# Patient Record
Sex: Male | Born: 1975
Health system: Southern US, Community
[De-identification: ages and names within clinical notes are randomized; demographics above are authoritative.]

## PROBLEM LIST (undated history)

## (undated) DIAGNOSIS — J302 Other seasonal allergic rhinitis: Secondary | ICD-10-CM

## (undated) DIAGNOSIS — I82409 Acute embolism and thrombosis of unspecified deep veins of unspecified lower extremity: Secondary | ICD-10-CM

---

## 2010-09-18 ENCOUNTER — Inpatient Hospital Stay (HOSPITAL_COMMUNITY)
Admission: EM | Admit: 2010-09-18 | Discharge: 2010-09-25 | DRG: 176 | Disposition: A | Discharge status: Home / Self Care | Payer: Self-pay | Attending: Interventional Cardiology | Admitting: Interventional Cardiology

## 2010-09-18 DIAGNOSIS — I309 Acute pericarditis, unspecified: Secondary | ICD-10-CM | POA: Diagnosis present

## 2010-09-18 DIAGNOSIS — I2699 Other pulmonary embolism without acute cor pulmonale: Principal | ICD-10-CM | POA: Diagnosis present

## 2010-09-18 DIAGNOSIS — F17200 Nicotine dependence, unspecified, uncomplicated: Secondary | ICD-10-CM | POA: Diagnosis present

## 2010-09-18 DIAGNOSIS — I82409 Acute embolism and thrombosis of unspecified deep veins of unspecified lower extremity: Secondary | ICD-10-CM | POA: Diagnosis present

## 2010-09-18 LAB — COMPREHENSIVE METABOLIC PANEL
AST: 34 U/L (ref 0–37)
CO2: 23 mEq/L (ref 19–32)
Calcium: 9.1 mg/dL (ref 8.4–10.5)
Creatinine, Ser: 1.45 mg/dL (ref 0.4–1.5)
GFR calc Af Amer: >60 mL/min (ref >60)
GFR calc non Af Amer: 56 mL/min — ABNORMAL LOW (ref >60)
Glucose, Bld: 99 mg/dL (ref 70–99)

## 2010-09-18 LAB — TROPONIN I: Troponin I: 0.08 ng/mL — ABNORMAL HIGH (ref 0.00–0.06)

## 2010-09-18 LAB — POCT CARDIAC MARKERS
CKMB, poc: 1.6 ng/mL (ref 1.0–8.0)
Myoglobin, poc: 104 ng/mL (ref 12–200)

## 2010-09-18 LAB — CK TOTAL AND CKMB (NOT AT ARMC)
Relative Index: 1.9 (ref 0.0–2.5)
Total CK: 181 U/L (ref 7–232)

## 2010-09-18 LAB — DIFFERENTIAL
Basophils Absolute: 0.1 10*3/uL (ref 0.0–0.1)
Basophils Relative: 1 % (ref 0–1)
Monocytes Absolute: 0.9 10*3/uL (ref 0.1–1.0)
Neutro Abs: 9.8 10*3/uL — ABNORMAL HIGH (ref 1.7–7.7)
Neutrophils Relative %: 71 % (ref 43–77)

## 2010-09-18 LAB — CBC
Hemoglobin: 15.2 g/dL (ref 13.0–17.0)
MCHC: 33.9 g/dL (ref 30.0–36.0)
WBC: 13.7 10*3/uL — ABNORMAL HIGH (ref 4.0–10.5)

## 2010-09-19 LAB — BRAIN NATRIURETIC PEPTIDE: Pro B Natriuretic peptide (BNP): 83 pg/mL (ref 0.0–100.0)

## 2010-09-19 LAB — CARDIAC PANEL(CRET KIN+CKTOT+MB+TROPI)
CK, MB: 3.6 ng/mL (ref 0.3–4.0)
Relative Index: 2.5 (ref 0.0–2.5)
Relative Index: 3 — ABNORMAL HIGH (ref 0.0–2.5)
Total CK: 108 U/L (ref 7–232)
Troponin I: 0.02 ng/mL (ref 0.00–0.06)
Troponin I: 0.01 ng/mL (ref 0.00–0.06)

## 2010-09-19 LAB — D-DIMER, QUANTITATIVE: D-Dimer, Quant: >20 ug/mL-FEU — ABNORMAL HIGH (ref 0.00–0.48)

## 2010-09-19 LAB — HOMOCYSTEINE: Homocysteine: 7 umol/L (ref 4.0–15.4)

## 2010-09-20 LAB — CARDIAC PANEL(CRET KIN+CKTOT+MB+TROPI)
CK, MB: 2.8 ng/mL (ref 0.3–4.0)
CK, MB: 2.6 ng/mL (ref 0.3–4.0)
Relative Index: 2.1 (ref 0.0–2.5)
Total CK: 133 U/L (ref 7–232)

## 2010-09-20 LAB — CBC
Hemoglobin: 14.9 g/dL (ref 13.0–17.0)
MCH: 30.3 pg (ref 26.0–34.0)
MCV: 88.6 fL (ref 78.0–100.0)
RBC: 4.91 MIL/uL (ref 4.22–5.81)

## 2010-09-20 LAB — URINALYSIS, ROUTINE W REFLEX MICROSCOPIC
Bilirubin Urine: NEGATIVE
Nitrite: NEGATIVE
Protein, ur: NEGATIVE mg/dL
Specific Gravity, Urine: 1.026 (ref 1.005–1.030)
Urobilinogen, UA: 1 mg/dL (ref 0.0–1.0)

## 2010-09-20 LAB — HEPARIN LEVEL (UNFRACTIONATED): Heparin Unfractionated: 0.42 IU/mL (ref 0.30–0.70)

## 2010-09-21 LAB — CBC
HCT: 44.1 % (ref 39.0–52.0)
Hemoglobin: 14.8 g/dL (ref 13.0–17.0)
MCH: 30.1 pg (ref 26.0–34.0)
MCHC: 33.6 g/dL (ref 30.0–36.0)
MCV: 89.8 fL (ref 78.0–100.0)
RBC: 4.91 MIL/uL (ref 4.22–5.81)

## 2010-09-21 LAB — PROTIME-INR: Prothrombin Time: 14 seconds (ref 11.6–15.2)

## 2010-09-21 LAB — CARDIAC PANEL(CRET KIN+CKTOT+MB+TROPI)
CK, MB: 2.3 ng/mL (ref 0.3–4.0)
CK, MB: 2.3 ng/mL (ref 0.3–4.0)
Relative Index: 1.9 (ref 0.0–2.5)
Relative Index: 2.2 (ref 0.0–2.5)
Total CK: 104 U/L (ref 7–232)
Total CK: 108 U/L (ref 7–232)
Troponin I: 0.01 ng/mL (ref 0.00–0.06)

## 2010-09-21 LAB — ANA: Anti Nuclear Antibody(ANA): NEGATIVE

## 2010-09-21 LAB — CARDIOLIPIN ANTIBODIES, IGG, IGM, IGA: Anticardiolipin IgM: 3 MPL U/mL — ABNORMAL LOW (ref <11)

## 2010-09-21 LAB — PROTEIN S ACTIVITY: Protein S Activity: 146 % — ABNORMAL HIGH (ref 69–129)

## 2010-09-21 LAB — PROTEIN C ACTIVITY: Protein C Activity: 133 % (ref 75–133)

## 2010-09-22 LAB — CBC
MCH: 29.9 pg (ref 26.0–34.0)
MCHC: 33.7 g/dL (ref 30.0–36.0)
MCV: 88.7 fL (ref 78.0–100.0)
Platelets: 206 10*3/uL (ref 150–400)
RBC: 4.95 MIL/uL (ref 4.22–5.81)

## 2010-09-22 LAB — LUPUS ANTICOAGULANT PANEL
DRVVT: 78.9 secs — ABNORMAL HIGH (ref 36.2–44.3)
PTT Lupus Anticoagulant: 200 secs — ABNORMAL HIGH (ref 30.0–45.6)
PTTLA Confirmation: 3.1 secs (ref <8.0)
dRVVT Incubated 1:1 Mix: 41.4 secs (ref 36.2–44.3)

## 2010-09-22 LAB — HEPARIN LEVEL (UNFRACTIONATED): Heparin Unfractionated: 0.42 IU/mL (ref 0.30–0.70)

## 2010-09-23 DIAGNOSIS — I82409 Acute embolism and thrombosis of unspecified deep veins of unspecified lower extremity: Secondary | ICD-10-CM

## 2010-09-23 DIAGNOSIS — I2699 Other pulmonary embolism without acute cor pulmonale: Secondary | ICD-10-CM

## 2010-09-23 LAB — BASIC METABOLIC PANEL
BUN: 13 mg/dL (ref 6–23)
CO2: 28 mEq/L (ref 19–32)
Calcium: 9.3 mg/dL (ref 8.4–10.5)
Chloride: 103 mEq/L (ref 96–112)
Creatinine, Ser: 1.39 mg/dL (ref 0.4–1.5)
Glucose, Bld: 97 mg/dL (ref 70–99)

## 2010-09-23 LAB — CBC
HCT: 44.1 % (ref 39.0–52.0)
MCHC: 32.4 g/dL (ref 30.0–36.0)
MCV: 89.3 fL (ref 78.0–100.0)
Platelets: 214 10*3/uL (ref 150–400)
RDW: 13.5 % (ref 11.5–15.5)
WBC: 9 10*3/uL (ref 4.0–10.5)

## 2010-09-24 LAB — CBC
HCT: 44.6 % (ref 39.0–52.0)
Hemoglobin: 15.4 g/dL (ref 13.0–17.0)
MCH: 30.1 pg (ref 26.0–34.0)
MCHC: 34.5 g/dL (ref 30.0–36.0)
MCV: 87.1 fL (ref 78.0–100.0)
RBC: 5.12 MIL/uL (ref 4.22–5.81)

## 2010-09-24 LAB — BASIC METABOLIC PANEL
BUN: 14 mg/dL (ref 6–23)
Calcium: 9 mg/dL (ref 8.4–10.5)
GFR calc non Af Amer: >60 mL/min (ref >60)
Glucose, Bld: 96 mg/dL (ref 70–99)
Potassium: 4.1 mEq/L (ref 3.5–5.1)
Sodium: 138 mEq/L (ref 135–145)

## 2010-09-24 LAB — PROTEIN C, TOTAL: Protein C, Total: 106 % (ref 70–140)

## 2010-09-24 LAB — PROTIME-INR
INR: 2.1 — ABNORMAL HIGH (ref 0.00–1.49)
Prothrombin Time: 23.7 seconds — ABNORMAL HIGH (ref 11.6–15.2)

## 2010-09-25 LAB — PROTIME-INR
INR: 2.06 — ABNORMAL HIGH (ref 0.00–1.49)
Prothrombin Time: 23.4 seconds — ABNORMAL HIGH (ref 11.6–15.2)

## 2010-09-25 LAB — CBC
MCH: 30.2 pg (ref 26.0–34.0)
MCHC: 34.5 g/dL (ref 30.0–36.0)
Platelets: 246 10*3/uL (ref 150–400)

## 2010-10-01 NOTE — H&P (Signed)
  NAMEBESNIK, FEBUS NO.:  [id number]  MEDICAL RECORD NO.:  [id number]          PATIENT TYPE:  INP  LOCATION:  2902                         FACILITY:  [facility]  PHYSICIAN:  Lyn Records, M.D.   DATE OF BIRTH:  July 13, 1976  DATE OF ADMISSION:  09/18/2010 DATE OF DISCHARGE:                             HISTORY & PHYSICAL   REFERRING PHYSICIAN:  Emergency room.  REASON FOR ADMISSION:  Chest pain and abnormal EKG.  HISTORY OF PRESENT ILLNESS:  Richard [name] is 35 and has a 1-week history of intermittent precordial pressure.  At times, the discomfort causes him to breathe shallow because the pain is intensified by deep breathing. He denies dyspnea.  There have been no palpitations or syncope.  No recent URI.  No orthopnea or lower extremity swelling.  He has not had syncope or palpitations.  PAST MEDICAL HISTORY:  Unremarkable.  FAMILY HISTORY:  Positive for CAD.  SOCIAL HISTORY:  Does not drink.  Does not use illicit drugs.  Smokes an occasional cigarette.  DRUG ALLERGIES:  None.  MEDICATIONS:  None.  REVIEW OF SYSTEMS:  Unremarkable.  PHYSICAL EXAMINATION:  VITAL SIGNS:  BP 138/70 and heart rate 70. SKIN:  Warm and dry. HEENT:  Unremarkable. NECK:  No significant JVD.  No carotid bruits. LUNGS:  Clear.  No rales. CARDIAC:  No rub, click, murmur, or gallop. ABDOMEN:  Soft. EXTREMITIES:  No edema.  Pulses are 2+ and symmetric. NEUROLOGIC:  Normal.  IMAGING DATA:  Chest x-ray no acute abnormality.  LABORATORY DATA:  WBC 13,700, hemoglobin 15.2, and platelet count 175,000.  PT/INR is normal.  Potassium 4.3 and troponin I 0.08.  CK-MB normal 181/3.4.  Point of care markers, normal.  Creatinine is 1.45, BUN is 15.  EKG demonstrates diffuse ST elevation with the exception of precordial leads V1-V2.  ASSESSMENT:  Chest pain syndrome with diffuse ST-segment elevation on EKG compatible with acute pericarditis.  Rule out mild pericarditis.  PLAN: 1. Emergency  echo. 2. Decadron 4 mg IV. 3. Admit to the CCU. 4. Monitor closely. 5. Non-steroidal anti-inflammatory therapy.     Lyn Records, M.D.     HWS/MEDQ  D:  09/18/2010  T:  09/19/2010  Job:  [id number]  Electronically Signed by Verdis Prime M.D. on 10/01/2010 05:01:14 PM

## 2010-10-01 NOTE — Discharge Summary (Signed)
  NAMELAM, MCCUBBINS NO.:  [id number]  MEDICAL RECORD NO.:  [id number]           PATIENT TYPE:  I  LOCATION:  3710                         FACILITY:  [facility]  PHYSICIAN:  Lyn Records, M.D.   DATE OF BIRTH:  18-Feb-1976  DATE OF ADMISSION:  09/18/2010 DATE OF DISCHARGE:  09/25/2010                              DISCHARGE SUMMARY   ADDENDUM.  DISCHARGE MEDICATION INSTRUCTION:  Coumadin/warfarin 5 mg tablets 1-1/2 tablets each p.m.     Lyn Records, M.D.     HWS/MEDQ  D:  09/25/2010  T:  09/26/2010  Job:  [id number]  Electronically Signed by Verdis Prime M.D. on 10/01/2010 05:01:25 PM

## 2010-10-01 NOTE — Discharge Summary (Signed)
NAMEROBYN, NOHR NO.:  [id number]  MEDICAL RECORD NO.:  [id number]           PATIENT TYPE:  I  LOCATION:  3710                         FACILITY:  [facility]  PHYSICIAN:  Lyn Records, M.D.   DATE OF BIRTH:  1976-04-17  DATE OF ADMISSION:  09/18/2010 DATE OF DISCHARGE:                              DISCHARGE SUMMARY   REASON FOR ADMISSION:  Chest discomfort/pressure.  DISCHARGE DIAGNOSES: 1. Large bilateral pulmonary emboli.     a.     Left lower extremity deep venous thrombosis. 2. Normal variant electrocardiogram with marked early repolarization     and precordial and inferolateral leads, mimicking acute     pericarditis.  PROCEDURES PERFORMED: 1. A 2-D Doppler echocardiogram on September 18, 2010. 2. Pulmonary CT angio performed on September 19, 2010. 3. Lower extremity venous Doppler study performed on September 19, 2010.  DISCHARGE INSTRUCTIONS: 1. [facility]. 2. Medications:  Coumadin taken as instructed. 3. Physical activity:  As tolerated.  FOLLOWUP APPOINTMENTS: 1. Dr. [name], [facility] on Friday, October 09, 2010 at     3:30 p.m. 2. Dr. Verdis Prime on September 28, 2010 at 3 p.m. 3. [facility] on September 28, 2010 at 3:15 p.m.  The     patient was cautioned to call if blood loss is noted from gums,     urinary tract, respiratory tract or GI.  HISTORY AND PHYSICAL AND HOSPITAL COURSE:  The patient presented to the [facility] Emergency Room on September 18, 2010, with a 7-day history of vague exertional chest discomfort.  He did not expound on any particular dyspnea.  In the emergency room, the EKG identified essentially diffuse ST elevation and T-wave inversion in V1 and V2.  He was initially considered to be a STEMI, but we quickly decided that this was not an accurate diagnosis.  We initially entertained the possibility of myopericarditis.  He was admitted to the intensive care unit.  A dose of IV Decadron, 4 mg  was given.  The Decadron was used for the possibility of pericardial inflammation as both symptomatic treatment and diagnostic procedure.  The diagnosis was still in question.  An emergency echo was done and demonstrated low normal LV function, no pericardial effusion, and mild RV enlargement.  Late on the evening of admission, a D-dimer was ordered because of concern about the possible RV enlargement.  This study came back greater than 20, which is markedly abnormal.  This was recognized the following morning on rounds and upon taking oxygen off of the patient, it was noted that he was hypoxic.  CT angio was performed and demonstrated significant bilateral pulmonary emboli.  IV heparin was started and after 24 hours, Coumadin therapy was started.  The patient gradually improved over the hospital stay.  Oxygen requirements decreased.  At the time of discharge, he is able to ambulate without desaturating.  Coumadin dose will be monitored in the [facility].  Hypercoagulable studies were done and were normal.  The anticipated course of therapy will be 12 months given the bilateral nature of the emboli and the large clot  burden noted on CT.  Social issues will make it difficult to manage the patient.  He has no health insurance, lives in [place], and does not work.  I may consider referral to Hematology to make sure that we are not missing a factor V Leiden deficiency or some other obscure hypercoagulable syndrome.     Lyn Records, M.D.     HWS/MEDQ  D:  09/24/2010  T:  09/25/2010  Job:  [id number]  cc:   Oretha Milch, MD  Electronically Signed by Verdis Prime M.D. on 10/01/2010 05:01:19 PM

## 2010-10-09 ENCOUNTER — Encounter: Payer: Self-pay | Admitting: Pulmonary Disease

## 2010-10-09 ENCOUNTER — Inpatient Hospital Stay (INDEPENDENT_AMBULATORY_CARE_PROVIDER_SITE_OTHER): Payer: Self-pay | Admitting: Pulmonary Disease

## 2010-10-09 DIAGNOSIS — I80299 Phlebitis and thrombophlebitis of other deep vessels of unspecified lower extremity: Secondary | ICD-10-CM

## 2010-10-09 DIAGNOSIS — I2699 Other pulmonary embolism without acute cor pulmonale: Secondary | ICD-10-CM

## 2010-10-09 HISTORY — DX: Phlebitis and thrombophlebitis of other deep vessels of unspecified lower extremity: I80.299

## 2010-10-14 NOTE — Assessment & Plan Note (Signed)
Summary: hospital follow up per brandi//sh   Visit Type:  Hospital Follow-up   History of Present Illness: 35/M with large bilateral pulmonary emboli & LLE DVT picked up by Dr Katrinka Blazing during hospitalisatioin for chest pain. Echo s/o RV dusfunction, has been on coumadin x 2 weeks , being regualted by the [facility] clinic. Denies dyspnea, chest pain or leg swelling. INR 2-3 range per report Hypercoagualble panel neg  Preventive Screening-Counseling & Management  Alcohol-Tobacco     Alcohol drinks/day: 1     Alcohol type: beer     Smoking Status: quit     Packs/Day: <0.25     Year Started: 1996     Year Quit: 08/2010  Current Medications (verified): 1)  Coumadin 5 Mg Tabs (Warfarin Sodium) .... As Directed Per Coumadin Clinic  Allergies (verified): No Known Drug Allergies  Past History:  Past Medical History: 1. Large bilateral pulmonary emboli.     a.     Left lower extremity deep venous thrombosis.  Past Surgical History: none  Family History: Family History Hypertension-mother Family History Diabetes-maternal aunt Heart disease-maternal aunts and uncles  Social History: Marital Status: Single Children: no Occupation: unemployeed Patient states former smoker.  Smoking Status:  quit Packs/Day:  <0.25 Alcohol drinks/day:  1  Review of Systems  The patient denies shortness of breath with activity, shortness of breath at rest, productive cough, non-productive cough, coughing up blood, chest pain, irregular heartbeats, acid heartburn, indigestion, loss of appetite, weight change, abdominal pain, difficulty swallowing, sore throat, tooth/dental problems, headaches, nasal congestion/difficulty breathing through nose, sneezing, itching, ear ache, anxiety, depression, hand/feet swelling, joint stiffness or pain, rash, change in color of mucus, and fever.    Vital Signs:  Patient profile:   34 year old male Height:      72 inches Weight:      215.8 pounds BMI:     29.37 O2  Sat:      94 % on Room air Temp:     98.9 degrees F oral Pulse rate:   86 / minute BP sitting:   114 / 78  (left arm) Cuff size:   large  Vitals Entered By: Zackery Barefoot CMA (October 09, 2010 3:51 PM)  O2 Flow:  Room air  Physical Exam  Additional Exam:  Gen. Pleasant, well-nourished, in no distress ENT - no lesions, no post nasal drip Neck: No JVD, no thyromegaly, no carotid bruits Lungs: no use of accessory muscles, no dullness to percussion, clear without rales or rhonchi  Cardiovascular: Rhythm regular, heart sounds  normal, no murmurs or gallops, no peripheral edema Musculoskeletal: No deformities, no cyanosis or clubbing      Impression & Recommendations:  Problem # 1:  PULMONARY EMBOLISM (ICD-415.1)  Idiopathic, no obvious risk factors Stay on coumadin x  12 months - If uneventful course, can consider stopping discussed risks of anticoagulation in general - he had questions about joining the military His updated medication list for this problem includes:    Coumadin 5 Mg Tabs (Warfarin sodium) .Marland Kitchen... As directed per coumadin clinic  Orders: Est. Patient Level III (16109)  Medications Added to Medication List This Visit: 1)  Coumadin 5 Mg Tabs (Warfarin sodium) .... As directed per coumadin clinic  Patient Instructions: 1)  Copy sent to: Dr Katrinka Blazing 2)  Please schedule a follow-up appointment in 6 months. 3)  Stay on coumadin x 1 year

## 2010-12-16 ENCOUNTER — Other Ambulatory Visit: Payer: Self-pay | Admitting: Oncology

## 2010-12-16 ENCOUNTER — Encounter (HOSPITAL_BASED_OUTPATIENT_CLINIC_OR_DEPARTMENT_OTHER): Payer: Self-pay | Admitting: Oncology

## 2010-12-16 DIAGNOSIS — I824Z9 Acute embolism and thrombosis of unspecified deep veins of unspecified distal lower extremity: Secondary | ICD-10-CM

## 2010-12-16 DIAGNOSIS — Z7901 Long term (current) use of anticoagulants: Secondary | ICD-10-CM

## 2010-12-16 DIAGNOSIS — I2699 Other pulmonary embolism without acute cor pulmonale: Secondary | ICD-10-CM

## 2010-12-16 DIAGNOSIS — I824Y9 Acute embolism and thrombosis of unspecified deep veins of unspecified proximal lower extremity: Secondary | ICD-10-CM

## 2010-12-16 LAB — CBC WITH DIFFERENTIAL/PLATELET
BASO%: 0.2 % (ref 0.0–2.0)
EOS%: 12.4 % — ABNORMAL HIGH (ref 0.0–7.0)
MCH: 30.6 pg (ref 27.2–33.4)
MCHC: 33.5 g/dL (ref 32.0–36.0)
MCV: 91.5 fL (ref 79.3–98.0)
MONO%: 6.9 % (ref 0.0–14.0)
RBC: 5.3 10*6/uL (ref 4.20–5.82)
RDW: 13.3 % (ref 11.0–14.6)
lymph#: 1.7 10*3/uL (ref 0.9–3.3)

## 2010-12-16 LAB — PROTIME-INR
INR: 2.7 (ref 2.00–3.50)
Protime: 32.4 Seconds — ABNORMAL HIGH (ref 10.6–13.4)

## 2010-12-22 ENCOUNTER — Ambulatory Visit (HOSPITAL_COMMUNITY)
Admission: RE | Admit: 2010-12-22 | Discharge: 2010-12-22 | Disposition: A | Discharge status: Home / Self Care | Payer: Self-pay | Source: Ambulatory Visit | Attending: Oncology | Admitting: Oncology

## 2010-12-22 DIAGNOSIS — Z0389 Encounter for observation for other suspected diseases and conditions ruled out: Secondary | ICD-10-CM | POA: Insufficient documentation

## 2010-12-22 DIAGNOSIS — K5730 Diverticulosis of large intestine without perforation or abscess without bleeding: Secondary | ICD-10-CM | POA: Insufficient documentation

## 2010-12-22 DIAGNOSIS — I2699 Other pulmonary embolism without acute cor pulmonale: Secondary | ICD-10-CM | POA: Insufficient documentation

## 2010-12-22 MED ORDER — IOHEXOL 300 MG/ML  SOLN
100.0000 mL | Freq: Once | INTRAMUSCULAR | Status: AC | PRN
  Administered 2010-12-22: 100 mL via INTRAVENOUS

## 2011-03-16 ENCOUNTER — Other Ambulatory Visit: Payer: Self-pay | Admitting: Oncology

## 2011-03-16 ENCOUNTER — Encounter (HOSPITAL_BASED_OUTPATIENT_CLINIC_OR_DEPARTMENT_OTHER): Payer: Self-pay | Admitting: Oncology

## 2011-03-16 DIAGNOSIS — I824Y9 Acute embolism and thrombosis of unspecified deep veins of unspecified proximal lower extremity: Secondary | ICD-10-CM

## 2011-03-16 DIAGNOSIS — Z7901 Long term (current) use of anticoagulants: Secondary | ICD-10-CM

## 2011-03-16 DIAGNOSIS — I2699 Other pulmonary embolism without acute cor pulmonale: Secondary | ICD-10-CM

## 2011-03-16 LAB — CBC WITH DIFFERENTIAL/PLATELET
Basophils Absolute: 0.1 10*3/uL (ref 0.0–0.1)
Eosinophils Absolute: 0.5 10*3/uL (ref 0.0–0.5)
HGB: 15.5 g/dL (ref 13.0–17.1)
LYMPH%: 27.6 % (ref 14.0–49.0)
MONO#: 0.7 10*3/uL (ref 0.1–0.9)
NEUT#: 5 10*3/uL (ref 1.5–6.5)
Platelets: 228 10*3/uL (ref 140–400)
RBC: 5 10*6/uL (ref 4.20–5.82)
WBC: 8.6 10*3/uL (ref 4.0–10.3)
nRBC: 0 % (ref 0–0)

## 2011-03-16 LAB — PROTIME-INR: Protime: 20.4 Seconds — ABNORMAL HIGH (ref 10.6–13.4)

## 2011-03-17 LAB — SEDIMENTATION RATE: Sed Rate: 1 mm/hr (ref 0–16)

## 2011-03-22 ENCOUNTER — Telehealth: Payer: Self-pay | Admitting: Pulmonary Disease

## 2011-03-22 NOTE — Telephone Encounter (Signed)
Received high  factor VIII level report from Dr Cyndie Chime - lifelong anticoagulation recommended.

## 2011-07-31 ENCOUNTER — Telehealth: Payer: Self-pay | Admitting: Oncology

## 2011-07-31 NOTE — Telephone Encounter (Signed)
Mailed the pt her jan 2013 appt calendar

## 2011-09-21 ENCOUNTER — Other Ambulatory Visit: Payer: Self-pay | Admitting: Lab

## 2011-09-21 ENCOUNTER — Ambulatory Visit: Payer: Self-pay | Admitting: Nurse Practitioner

## 2011-12-15 ENCOUNTER — Encounter (HOSPITAL_COMMUNITY): Payer: Self-pay | Admitting: *Deleted

## 2011-12-15 ENCOUNTER — Emergency Department (HOSPITAL_COMMUNITY)
Admission: EM | Admit: 2011-12-15 | Discharge: 2011-12-15 | Disposition: A | Discharge status: Home / Self Care | Payer: Self-pay | Attending: Emergency Medicine | Admitting: Emergency Medicine

## 2011-12-15 ENCOUNTER — Emergency Department (HOSPITAL_COMMUNITY): Payer: Self-pay

## 2011-12-15 DIAGNOSIS — R0609 Other forms of dyspnea: Secondary | ICD-10-CM | POA: Insufficient documentation

## 2011-12-15 DIAGNOSIS — F17200 Nicotine dependence, unspecified, uncomplicated: Secondary | ICD-10-CM | POA: Insufficient documentation

## 2011-12-15 DIAGNOSIS — J3489 Other specified disorders of nose and nasal sinuses: Secondary | ICD-10-CM | POA: Insufficient documentation

## 2011-12-15 DIAGNOSIS — R0602 Shortness of breath: Secondary | ICD-10-CM | POA: Insufficient documentation

## 2011-12-15 DIAGNOSIS — R002 Palpitations: Secondary | ICD-10-CM | POA: Insufficient documentation

## 2011-12-15 DIAGNOSIS — R0600 Dyspnea, unspecified: Secondary | ICD-10-CM

## 2011-12-15 DIAGNOSIS — R0989 Other specified symptoms and signs involving the circulatory and respiratory systems: Secondary | ICD-10-CM | POA: Insufficient documentation

## 2011-12-15 DIAGNOSIS — Z86718 Personal history of other venous thrombosis and embolism: Secondary | ICD-10-CM | POA: Insufficient documentation

## 2011-12-15 DIAGNOSIS — Z7901 Long term (current) use of anticoagulants: Secondary | ICD-10-CM | POA: Insufficient documentation

## 2011-12-15 HISTORY — DX: Other seasonal allergic rhinitis: J30.2

## 2011-12-15 HISTORY — DX: Acute embolism and thrombosis of unspecified deep veins of unspecified lower extremity: I82.409

## 2011-12-15 LAB — POCT I-STAT, CHEM 8
HCT: 48 % (ref 39.0–52.0)
Hemoglobin: 16.3 g/dL (ref 13.0–17.0)
Potassium: 4 mEq/L (ref 3.5–5.1)
Sodium: 140 mEq/L (ref 135–145)

## 2011-12-15 LAB — TROPONIN I: Troponin I: <0.3 ng/mL (ref <0.30)

## 2011-12-15 LAB — CBC
Hemoglobin: 15.6 g/dL (ref 13.0–17.0)
MCV: 88.9 fL (ref 78.0–100.0)
Platelets: 287 10*3/uL (ref 150–400)
RBC: 5.04 MIL/uL (ref 4.22–5.81)
WBC: 10.9 10*3/uL — ABNORMAL HIGH (ref 4.0–10.5)

## 2011-12-15 LAB — PROTIME-INR: Prothrombin Time: 13.1 seconds (ref 11.6–15.2)

## 2011-12-15 LAB — DIFFERENTIAL
Lymphocytes Relative: 24 % (ref 12–46)
Lymphs Abs: 2.6 10*3/uL (ref 0.7–4.0)
Neutrophils Relative %: 53 % (ref 43–77)

## 2011-12-15 MED ORDER — ENOXAPARIN SODIUM 150 MG/ML ~~LOC~~ SOLN: 150.0000 mg | Freq: Every day | SUBCUTANEOUS | Status: DC

## 2011-12-15 MED ORDER — SODIUM CHLORIDE 0.9 % IV BOLUS (SEPSIS)
500.0000 mL | Freq: Once | INTRAVENOUS | Status: AC
  Administered 2011-12-15: 500 mL via INTRAVENOUS

## 2011-12-15 MED ORDER — SODIUM CHLORIDE 0.9 % IV SOLN
INTRAVENOUS | Status: DC
  Administered 2011-12-15: 125 mL/h via INTRAVENOUS

## 2011-12-15 MED ORDER — IOHEXOL 350 MG/ML SOLN
100.0000 mL | Freq: Once | INTRAVENOUS | Status: AC | PRN
  Administered 2011-12-15: 100 mL via INTRAVENOUS

## 2011-12-15 MED ORDER — WARFARIN SODIUM 5 MG PO TABS: 5.0000 mg | ORAL_TABLET | Freq: Every day | ORAL | Status: DC

## 2011-12-15 NOTE — ED Provider Notes (Signed)
History     CSN: [id number]  Arrival date & time 12/15/11  0845   First MD Initiated Contact with Patient 12/15/11 0930      Chief Complaint  Patient presents with  . Shortness of Breath  . Palpitations    (Consider location/radiation/quality/duration/timing/severity/associated sxs/prior treatment) Patient is a 36 y.o. male presenting with shortness of breath and palpitations. The history is provided by the patient.  Shortness of Breath  Associated symptoms include chest pain and shortness of breath.  Palpitations  Associated symptoms include chest pain and shortness of breath. Pertinent negatives include no numbness, no abdominal pain, no nausea, no vomiting, no headaches, no back pain and no weakness.   patient states that he has had some shortness of breath and palpitations for the last week or 2. It is somewhat sharp pain. Is worse with exertion. He has increased trouble breathing while he is trying to exert himself. His previous history of DVTs pulmonary embolisms. Is not on Coumadin anymore she is unable to the doctor. He states this feels somewhat like a pulmonary embolus 4. He's also had some nasal congestion. No fevers. No hemoptysis.  Past Medical History  Diagnosis Date  . DVT (deep venous thrombosis)   . Seasonal allergies     History reviewed. No pertinent past surgical history.  No family history on file.  History  Substance Use Topics  . Smoking status: Current Everyday Smoker  . Smokeless tobacco: Not on file  . Alcohol Use: Yes      Review of Systems  Constitutional: Negative for activity change and appetite change.  HENT: Negative for neck stiffness.   Eyes: Negative for pain.  Respiratory: Positive for shortness of breath. Negative for chest tightness.   Cardiovascular: Positive for chest pain and palpitations. Negative for leg swelling.  Gastrointestinal: Negative for nausea, vomiting, abdominal pain and diarrhea.  Genitourinary: Negative for flank  pain.  Musculoskeletal: Negative for back pain.  Skin: Negative for rash.  Neurological: Negative for weakness, numbness and headaches.  Psychiatric/Behavioral: Negative for behavioral problems.    Allergies  Review of patient's allergies indicates no known allergies.  Home Medications   Current Outpatient Rx  Name Route Sig Dispense Refill  . ENOXAPARIN SODIUM 150 MG/ML [place] SOLN Subcutaneous Inject 1 mL (150 mg total) into the skin daily. 7 Syringe 0  . WARFARIN SODIUM 5 MG PO TABS Oral Take 1 tablet (5 mg total) by mouth daily. 30 tablet 0    BP 135/80  Pulse 95  Temp(Src) 97.8 F (36.6 C) (Oral)  Resp 15  Ht 6' (1.829 m)  Wt 210 lb (95.255 kg)  BMI 28.48 kg/m2  SpO2 94%  Physical Exam  Nursing note and vitals reviewed. Constitutional: He is oriented to person, place, and time. He appears well-developed and well-nourished.  HENT:  Head: Normocephalic and atraumatic.  Eyes: EOM are normal. Pupils are equal, round, and reactive to light.  Neck: Normal range of motion. Neck supple.  Cardiovascular: Normal rate, regular rhythm and normal heart sounds.   No murmur heard. Pulmonary/Chest: Effort normal and breath sounds normal. He exhibits tenderness.       Tenderness to left anterior chest medially. No crepitance or deformity. No rash.  Abdominal: Soft. Bowel sounds are normal. He exhibits no distension and no mass. There is no tenderness. There is no rebound and no guarding.  Musculoskeletal: Normal range of motion. He exhibits no edema.  Neurological: He is alert and oriented to person, place, and time. No cranial  nerve deficit.  Skin: Skin is warm and dry.  Psychiatric: He has a normal mood and affect.    ED Course  Procedures (including critical care time)  Labs Reviewed  CBC - Abnormal; Notable for the following:    WBC 10.9 (*)    All other components within normal limits  DIFFERENTIAL - Abnormal; Notable for the following:    Eosinophils Relative 17 (*)     Eosinophils Absolute 1.8 (*)    Basophils Relative 2 (*)    Basophils Absolute 0.2 (*)    All other components within normal limits  POCT I-STAT, CHEM 8 - Abnormal; Notable for the following:    Glucose, Bld 101 (*)    All other components within normal limits  TROPONIN I  PROTIME-INR   Ct Angio Chest W/cm &/or Wo Cm  12/15/2011  *RADIOLOGY REPORT*  Clinical Data: Left.  Palpitations.  Chest discomfort.  History of previous pulmonary emboli.  CT ANGIOGRAPHY CHEST  Technique:  Multidetector CT imaging of the chest using the standard protocol during bolus administration of intravenous contrast. Multiplanar reconstructed images including MIPs were obtained and reviewed to evaluate the vascular anatomy.  Contrast: OMNIPAQUE IOHEXOL 350 MG/ML SOLN  Comparison: 09/19/2010  Findings: Pulmonary dural opacification is good.  There are no acute pulmonary emboli.  There are a few small scars within the pulmonary vessels related to the distant embolic episode.  No aortic pathology is seen.  No coronary artery calcification.  No pleural or pericardial fluid.  No hilar or mediastinal adenopathy. No pulmonary parenchymal pathology.  No chest wall abnormality.  IMPRESSION: Negative study.  No evidence of recurrent pulmonary emboli.  No other acute chest pathology evident.  Original Report Authenticated By: Thomasenia Sales, M.D.     1. Dyspnea      Date: 12/15/2011  Rate: 74  Rhythm: normal sinus rhythm  QRS Axis: normal  Intervals: normal  ST/T Wave abnormalities: early repolarization  Conduction Disutrbances:none  Narrative Interpretation:   Old EKG Reviewed: none available    MDM  Shortness of breath and palpitations. Previous history of pulmonary embolisms. He's been off his medications for 4 months. Lab work is reassuring. CT does not show pulmonary embolism. EKG shows early repolarization that is similar to a report from a previous EKG. It'll be discharged to followup at the Coumadin  clinic.        Juliet Rude. Rubin Payor, MD 12/15/11 1517

## 2011-12-15 NOTE — ED Notes (Signed)
Patient reports he has had had sob and heart racing for the past 10 days.  He is to be taking blood thinners but he has been out for 4 mths.  Patient states he noticed his heart racing when he is working.  He is also complaining of allergy sx

## 2011-12-15 NOTE — Discharge Instructions (Signed)
Shortness of Breath Shortness of breath (dyspnea) is the feeling of uneasy breathing. Shortness of breath does not always mean that there is a life-threatening illness. However, shortness of breath requires immediate medical care. CAUSES  Causes for shortness of breath include:  Not enough oxygen in the air (as with high altitudes or with a smoke-filled room).   Short-term (acute) lung disease, including:   Infections such as pneumonia.   Fluid in the lungs, such as heart failure.   A blood clot in the lungs (pulmonary embolism).   Lasting (chronic) lung diseases.   Heart disease (heart attack, angina, heart failure, and others).   Low red blood cells (anemia).   Poor physical fitness. This can cause shortness of breath when you exercise.   Chest or back injuries or stiffness.   Being overweight (obese).   Anxiety. This can make you feel like you are not getting enough air.  DIAGNOSIS  Serious medical problems can usually be found during your physical exam. Many tests may also be done to determine why you are having shortness of breath. Tests include:  Chest X-rays.   Lung function tests.   Blood tests.   Electrocardiography.   Exercise testing.   A cardiac echo.   Imaging scans.  Your caregiver may not be able to find a cause for your shortness of breath after your exam. In this case, it is important to have a follow-up exam with your caregiver as directed.  HOME CARE INSTRUCTIONS   Do not smoke. Smoking is a common cause of shortness of breath. Ask for help to stop smoking.   Avoid being around chemicals that may bother your breathing (paint fumes, dust).   Rest as needed. Slowly resume your usual activities.   If medicines were prescribed, take them as directed for the full length of time directed. This includes oxygen and any inhaled medicines.   Follow up with your caregiver as directed. Waiting to do so or failure to follow up could result in worsening of  your condition and possible disability or death.   Be sure you understand what to do or who to call if your shortness of breath worsens.  SEEK MEDICAL CARE IF:   Your condition does not improve in the time expected.   You have a hard time doing your normal activities even with rest.   You have any side effects or problems with the medicines prescribed.   You develop any new symptoms.  SEEK IMMEDIATE MEDICAL CARE IF:   Your shortness of breath is getting worse.   You feel lightheaded, faint, or develop a cough not controlled with medicines.   You start coughing up blood.   You have pain with breathing.   You have chest pain or pain in your arms, shoulders, or abdomen.   You have a fever.   You are unable to walk up stairs or exercise the way you normally do.   Your symptoms are getting worse.  Document Released: 05/04/2001 Document Revised: 07/29/2011 Document Reviewed: 12/20/2007 [employer] Patient Information 2012 [employer]. 

## 2012-04-05 ENCOUNTER — Encounter (HOSPITAL_COMMUNITY): Payer: Self-pay

## 2012-04-05 ENCOUNTER — Inpatient Hospital Stay (HOSPITAL_COMMUNITY)
Admission: EM | Admit: 2012-04-05 | Discharge: 2012-04-10 | DRG: 176 | Disposition: A | Discharge status: Home / Self Care | Payer: MEDICAID | Attending: Internal Medicine | Admitting: Internal Medicine

## 2012-04-05 ENCOUNTER — Emergency Department (HOSPITAL_COMMUNITY): Payer: Self-pay

## 2012-04-05 DIAGNOSIS — I2699 Other pulmonary embolism without acute cor pulmonale: Principal | ICD-10-CM | POA: Diagnosis present

## 2012-04-05 DIAGNOSIS — Z720 Tobacco use: Secondary | ICD-10-CM

## 2012-04-05 DIAGNOSIS — Z86718 Personal history of other venous thrombosis and embolism: Secondary | ICD-10-CM

## 2012-04-05 DIAGNOSIS — Z7901 Long term (current) use of anticoagulants: Secondary | ICD-10-CM

## 2012-04-05 DIAGNOSIS — F17200 Nicotine dependence, unspecified, uncomplicated: Secondary | ICD-10-CM | POA: Diagnosis present

## 2012-04-05 DIAGNOSIS — J309 Allergic rhinitis, unspecified: Secondary | ICD-10-CM | POA: Diagnosis present

## 2012-04-05 LAB — COMPREHENSIVE METABOLIC PANEL
ALT: 19 U/L (ref 0–53)
AST: 23 U/L (ref 0–37)
CO2: 27 mEq/L (ref 19–32)
Calcium: 9.1 mg/dL (ref 8.4–10.5)
Chloride: 105 mEq/L (ref 96–112)
GFR calc non Af Amer: 73 mL/min — ABNORMAL LOW (ref >90)
Sodium: 141 mEq/L (ref 135–145)

## 2012-04-05 LAB — POCT I-STAT TROPONIN I: Troponin i, poc: 0 ng/mL (ref 0.00–0.08)

## 2012-04-05 LAB — CBC WITH DIFFERENTIAL/PLATELET
Basophils Relative: 1 % (ref 0–1)
Eosinophils Absolute: 0.4 10*3/uL (ref 0.0–0.7)
Eosinophils Relative: 3 % (ref 0–5)
Hemoglobin: 16.4 g/dL (ref 13.0–17.0)
Lymphocytes Relative: 29 % (ref 12–46)
Neutrophils Relative %: 58 % (ref 43–77)
RBC: 5.27 MIL/uL (ref 4.22–5.81)
WBC: 14 10*3/uL — ABNORMAL HIGH (ref 4.0–10.5)

## 2012-04-05 LAB — APTT: aPTT: 26 seconds (ref 24–37)

## 2012-04-05 MED ORDER — KETOROLAC TROMETHAMINE 30 MG/ML IJ SOLN
30.0000 mg | Freq: Once | INTRAMUSCULAR | Status: AC
  Administered 2012-04-05: 30 mg via INTRAVENOUS
  Filled 2012-04-05: qty 1

## 2012-04-05 NOTE — ED Provider Notes (Signed)
History     CSN: [id number]  Arrival date & time 04/05/12  1718   First MD Initiated Contact with Patient 04/05/12 2256      Chief Complaint  Patient presents with  . Chest Pain    (Consider location/radiation/quality/duration/timing/severity/associated sxs/prior treatment) HPI Comments: Patient presents with substernal chest pain it radiates to the left side of his chest that comes and goes every few minutes. Last for a few minutes at a time. Resolved on its own. There is no shortness of breath, cough or fever. Pain feels similar to what clot patient had last year. He supposed to be on Coumadin but has not taken it for 3 weeks. Denies any pain as well as swelling in his legs.  The history is provided by the patient.    Past Medical History  Diagnosis Date  . DVT (deep venous thrombosis)   . Seasonal allergies     History reviewed. No pertinent past surgical history.  Family History  Problem Relation Age of Onset  . Other Mother   . Other Father   . Pulmonary embolism Other     History  Substance Use Topics  . Smoking status: Current Everyday Smoker -- 1 years    Types: Cigars  . Smokeless tobacco: Not on file  . Alcohol Use: Yes      Review of Systems  Constitutional: Negative for fever, activity change and appetite change.  HENT: Negative for congestion and rhinorrhea.   Respiratory: Positive for chest tightness. Negative for cough and shortness of breath.   Cardiovascular: Positive for chest pain.  Gastrointestinal: Negative for nausea, vomiting and abdominal pain.  Genitourinary: Negative for dysuria and hematuria.  Musculoskeletal: Negative for back pain.  Skin: Negative for rash.  Neurological: Negative for dizziness, numbness and headaches.    Allergies  Review of patient's allergies indicates no known allergies.  Home Medications   No current outpatient prescriptions on file.  BP 127/74  Pulse 85  Temp 98 F (36.7 C) (Oral)  Resp 16  Ht 6'  (1.829 m)  Wt 209 lb 7 oz (95 kg)  BMI 28.40 kg/m2  SpO2 97%  Physical Exam  Constitutional: He is oriented to person, place, and time. He appears well-developed and well-nourished. No distress.  HENT:  Head: Normocephalic and atraumatic.  Mouth/Throat: Oropharynx is clear and moist.  Eyes: Conjunctivae and EOM are normal. Pupils are equal, round, and reactive to light.  Neck: Normal range of motion. Neck supple.  Cardiovascular: Normal rate, regular rhythm and normal heart sounds.   No murmur heard. Pulmonary/Chest: Effort normal and breath sounds normal. No respiratory distress.  Abdominal: Soft. There is no tenderness. There is no rebound and no guarding.  Musculoskeletal: Normal range of motion. He exhibits no edema and no tenderness.  Neurological: He is alert and oriented to person, place, and time. No cranial nerve deficit.  Skin: Skin is warm.    ED Course  Procedures (including critical care time)  Labs Reviewed  CBC WITH DIFFERENTIAL - Abnormal; Notable for the following:    WBC 14.0 (*)     Neutro Abs 8.1 (*)     Lymphs Abs 4.1 (*)     Monocytes Absolute 1.3 (*)     All other components within normal limits  COMPREHENSIVE METABOLIC PANEL - Abnormal; Notable for the following:    GFR calc non Af Amer 73 (*)     GFR calc Af Amer 85 (*)     All other components within normal  limits  COMPREHENSIVE METABOLIC PANEL - Abnormal; Notable for the following:    Glucose, Bld 130 (*)     Calcium 8.2 (*)     Total Protein 5.3 (*)     Albumin 3.0 (*)     GFR calc non Af Amer 85 (*)     All other components within normal limits  CBC - Abnormal; Notable for the following:    WBC 12.8 (*)     All other components within normal limits  POCT I-STAT TROPONIN I  APTT  PROTIME-INR  POCT I-STAT TROPONIN I  CARDIAC PANEL(CRET KIN+CKTOT+MB+TROPI)  HEPARIN LEVEL (UNFRACTIONATED)   Dg Chest 2 View  04/05/2012  *RADIOLOGY REPORT*  Clinical Data: Chest pain.  CHEST - 2 VIEW   Comparison: CT chest 12/15/2011 and plain film chest 09/18/2010.  Findings: The lungs are clear.  No pneumothorax or pleural fluid. Heart size is normal.  No focal bony abnormality.  IMPRESSION: Negative exam.  Original Report Authenticated By: Bernadene Bell. Maricela Curet, M.D.   Ct Angio Chest W/cm &/or Wo Cm  04/06/2012  *RADIOLOGY REPORT*  Clinical Data: Chest pain  CT ANGIOGRAPHY CHEST  Technique:  Multidetector CT imaging of the chest using the standard protocol during bolus administration of intravenous contrast. Multiplanar reconstructed images including MIPs were obtained and reviewed to evaluate the vascular anatomy.  Contrast: OMNIPAQUE IOHEXOL 350 MG/ML SOLN  Comparison: 12/15/2011  Findings: There is good contrast opacification of pulmonary artery branches.  There is a nonocclusive embolus in the right pulmonary artery extending into right middle and lower lobe segmental branches. Probably also a smaller segmental embolus in the medial basal segment left lower lobe pulmonary artery branch.  There is early contrast opacification of the thoracic aorta, without evidence of dissection or aneurysm.  No pleural or pericardial effusion.  No hilar or mediastinal adenopathy.  Lungs are clear. Thoracic spine and sternum are intact.  Visualized upper abdomen unremarkable.  IMPRESSION:  Bilateral segmental pulmonary emboli, right greater than left. I telephoned the critical test results to Dr. Manus Gunning at the time of interpretation.  Original Report Authenticated By: Osa Craver, M.D.     1. Pulmonary embolism   2. Tobacco abuse       MDM  Intermittent left-sided chest pain that comes and goes every few minutes as atypical for angina but similar to patient's PE pain in the past. Vital stable, no distress.  CT shows bilateral pulmonary emboli. Patient remains he medically stable. He started on heparin and will be admitted for further treatment. He's been poorly compliant with his Coumadin the  past.    Date: 04/05/2012  Rate: 88  Rhythm: normal sinus rhythm  QRS Axis: normal  Intervals: normal  ST/T Wave abnormalities: early repolarization  Conduction Disutrbances:none  Narrative Interpretation:   Old EKG Reviewed: unchanged    Glynn Octave, MD 04/06/12 [phone]

## 2012-04-05 NOTE — ED Notes (Addendum)
Pt reports mid-sternum chest pain starting today, pt denies N/V/D, chest congestion, cough, back/arm/abd pain, or sob

## 2012-04-05 NOTE — ED Notes (Signed)
Patient stated that he woke up with this chest pain this morning.  Had this pain in the past and was admitted with blood clots.

## 2012-04-06 ENCOUNTER — Encounter (HOSPITAL_COMMUNITY): Payer: Self-pay | Admitting: Radiology

## 2012-04-06 ENCOUNTER — Emergency Department (HOSPITAL_COMMUNITY): Payer: Self-pay

## 2012-04-06 DIAGNOSIS — I2699 Other pulmonary embolism without acute cor pulmonale: Principal | ICD-10-CM

## 2012-04-06 DIAGNOSIS — Z720 Tobacco use: Secondary | ICD-10-CM | POA: Diagnosis present

## 2012-04-06 DIAGNOSIS — F17200 Nicotine dependence, unspecified, uncomplicated: Secondary | ICD-10-CM

## 2012-04-06 LAB — CBC
HCT: 42.6 % (ref 39.0–52.0)
Hemoglobin: 14.9 g/dL (ref 13.0–17.0)
MCHC: 35 g/dL (ref 30.0–36.0)
WBC: 12.8 10*3/uL — ABNORMAL HIGH (ref 4.0–10.5)

## 2012-04-06 LAB — COMPREHENSIVE METABOLIC PANEL
ALT: 14 U/L (ref 0–53)
BUN: 16 mg/dL (ref 6–23)
CO2: 23 mEq/L (ref 19–32)
Calcium: 8.2 mg/dL — ABNORMAL LOW (ref 8.4–10.5)
Creatinine, Ser: 1.11 mg/dL (ref 0.50–1.35)
GFR calc Af Amer: >90 mL/min (ref >90)
GFR calc non Af Amer: 85 mL/min — ABNORMAL LOW (ref >90)
Glucose, Bld: 130 mg/dL — ABNORMAL HIGH (ref 70–99)
Total Protein: 5.3 g/dL — ABNORMAL LOW (ref 6.0–8.3)

## 2012-04-06 LAB — HEPARIN LEVEL (UNFRACTIONATED): Heparin Unfractionated: 1.07 IU/mL — ABNORMAL HIGH (ref 0.30–0.70)

## 2012-04-06 LAB — CARDIAC PANEL(CRET KIN+CKTOT+MB+TROPI)
CK, MB: 3.1 ng/mL (ref 0.3–4.0)
Total CK: 178 U/L (ref 7–232)

## 2012-04-06 MED ORDER — IOHEXOL 350 MG/ML SOLN
100.0000 mL | Freq: Once | INTRAVENOUS | Status: AC | PRN
  Administered 2012-04-06: 100 mL via INTRAVENOUS

## 2012-04-06 MED ORDER — SODIUM CHLORIDE 0.9 % IV SOLN
INTRAVENOUS | Status: AC
  Administered 2012-04-06: 02:00:00 via INTRAVENOUS

## 2012-04-06 MED ORDER — ONDANSETRON HCL 4 MG/2ML IJ SOLN: 4.0000 mg | Freq: Three times a day (TID) | INTRAMUSCULAR | Status: DC | PRN

## 2012-04-06 MED ORDER — HEPARIN (PORCINE) IN NACL 100-0.45 UNIT/ML-% IJ SOLN
1250.0000 [IU]/h | INTRAMUSCULAR | Status: DC
  Filled 2012-04-06: qty 250

## 2012-04-06 MED ORDER — HEPARIN (PORCINE) IN NACL 100-0.45 UNIT/ML-% IJ SOLN
1650.0000 [IU]/h | INTRAMUSCULAR | Status: DC
  Administered 2012-04-06: 1650 [IU]/h via INTRAVENOUS
  Filled 2012-04-06 (×2): qty 250

## 2012-04-06 MED ORDER — HEPARIN (PORCINE) IN NACL 100-0.45 UNIT/ML-% IJ SOLN
1250.0000 [IU]/h | INTRAMUSCULAR | Status: DC
  Administered 2012-04-07: 1250 [IU]/h via INTRAVENOUS
  Filled 2012-04-06 (×2): qty 250

## 2012-04-06 MED ORDER — SODIUM CHLORIDE 0.9 % IJ SOLN
3.0000 mL | Freq: Two times a day (BID) | INTRAMUSCULAR | Status: DC
  Administered 2012-04-08 – 2012-04-10 (×6): 3 mL via INTRAVENOUS

## 2012-04-06 MED ORDER — ACETAMINOPHEN 325 MG PO TABS
650.0000 mg | ORAL_TABLET | Freq: Four times a day (QID) | ORAL | Status: DC | PRN
  Administered 2012-04-06: 650 mg via ORAL
  Filled 2012-04-06: qty 2

## 2012-04-06 MED ORDER — HEPARIN BOLUS VIA INFUSION
4000.0000 [IU] | Freq: Once | INTRAVENOUS | Status: AC
  Administered 2012-04-06: 4000 [IU] via INTRAVENOUS

## 2012-04-06 MED ORDER — HEPARIN (PORCINE) IN NACL 100-0.45 UNIT/ML-% IJ SOLN
18.0000 [IU]/kg/h | INTRAMUSCULAR | Status: DC
  Administered 2012-04-06: 18 [IU]/kg/h via INTRAVENOUS
  Filled 2012-04-06 (×3): qty 250

## 2012-04-06 MED ORDER — ACETAMINOPHEN 650 MG RE SUPP: 650.0000 mg | Freq: Four times a day (QID) | RECTAL | Status: DC | PRN

## 2012-04-06 MED ORDER — COUMADIN BOOK
Freq: Once | Status: AC
  Administered 2012-04-06: 05:00:00
  Filled 2012-04-06: qty 1

## 2012-04-06 MED ORDER — WARFARIN SODIUM 10 MG PO TABS
10.0000 mg | ORAL_TABLET | ORAL | Status: AC
  Administered 2012-04-06: 10 mg via ORAL
  Filled 2012-04-06: qty 1

## 2012-04-06 MED ORDER — WARFARIN - PHARMACIST DOSING INPATIENT: Freq: Every day | Status: DC

## 2012-04-06 MED ORDER — HEPARIN (PORCINE) IN NACL 100-0.45 UNIT/ML-% IJ SOLN
18.0000 [IU]/kg/h | Freq: Once | INTRAMUSCULAR | Status: AC
  Administered 2012-04-06: 18 [IU]/kg/h via INTRAVENOUS
  Filled 2012-04-06: qty 250

## 2012-04-06 MED ORDER — HEPARIN (PORCINE) IN NACL 100-0.45 UNIT/ML-% IJ SOLN
15.0000 [IU]/kg/h | INTRAMUSCULAR | Status: DC
  Filled 2012-04-06: qty 250

## 2012-04-06 MED ORDER — SODIUM CHLORIDE 0.9 % IV SOLN
INTRAVENOUS | Status: DC
  Administered 2012-04-06 (×2): via INTRAVENOUS

## 2012-04-06 MED ORDER — ONDANSETRON HCL 4 MG PO TABS: 4.0000 mg | ORAL_TABLET | Freq: Four times a day (QID) | ORAL | Status: DC | PRN

## 2012-04-06 MED ORDER — WARFARIN VIDEO
Freq: Once | Status: AC
  Administered 2012-04-06: 15:00:00

## 2012-04-06 MED ORDER — ONDANSETRON HCL 4 MG/2ML IJ SOLN: 4.0000 mg | Freq: Four times a day (QID) | INTRAMUSCULAR | Status: DC | PRN

## 2012-04-06 NOTE — Care Management Note (Signed)
    Page 1 of 2   04/10/2012     11:55:19 AM   CARE MANAGEMENT NOTE 04/10/2012  Patient:  MATTHIAS, BOGUS   Account Number:  [id number]  Date Initiated:  04/06/2012  Documentation initiated by:  SIMMONS,CRYSTAL  Subjective/Objective Assessment:   ADMITTED WITH PE; LIVES AT HOME WITH FRIENDS; WAS IPTA.     Action/Plan:   DISCHARGE PLANNING DISCUSSED AT BEDSIDE.   Anticipated DC Date:  04/10/2012   Anticipated DC Plan:  HOME/SELF CARE      DC Planning Services  CM consult  Medication Assistance  PCP issues      Choice offered to / List presented to:             Status of service:  Completed, signed off Medicare Important Message given?   (If response is "NO", the following Medicare IM given date fields will be blank) Date Medicare IM given:   Date Additional Medicare IM given:    Discharge Disposition:  HOME/SELF CARE  Per UR Regulation:  Reviewed for med. necessity/level of care/duration of stay  If discussed at Long Length of Stay Meetings, dates discussed:    Comments:  04/08/12 11:49 Letha Cape RN, BSN [phone] patient is for dc today, scheduled patient [facility] on Wed 8/21 at 9:30 am  and he is to ask for information for [facility],  he then has an appt on Friday 8/23 at 9:15 to see Dr. Everardo Beals and will need to bring information filled out that he received from [facility]. Patient is eligible for med ast. Patient will need med ast with lovenox, awaiting MD signature so I can tube lovenox information  and other scripts down to pharmacy.   04/07/12  1341  CRYSTAL SIMMONS RN, BSN [phone] SPOKE WITH PT SEVERAL TIMES ABOUT FINDING A PCP TO MANAGE HIS PT/ INR- STATED HE CANNOT AFFORD ANY OUT OF POCKET EXPENSE D/T LACK OF INCOME-  MOST CLINICS REQUIRE A CO-PAY AND AN ADDITIONAL FEE FOR LAB DRAWS; PROVIDER LIST GIVEN TO PT; MD AWARE.  04/07/12  1914  CRYSTAL SIMMONS RN, BSN [phone] PT IS ELIGIBLE FOR Z[facility] FUND; WILL NEED SEPARATE RX  FOR 3 DAYS SUPPLY OF MEDS FOR MAIN PHARMACY TO FILL; MAY NEED TO PAY OUT OF POCKET FOR 2 ADDITIONAL DOSES OF GENERIC LOVENOX.  04/06/12  1136  CRYSTAL SIMMONS RN, BSN [phone] HAS PCP AT [facility]; SPOKE WITH PT ABOUT MEDICATION ASSISTANCE; HE STATED HE CAN AFFORD $4 GENERIC MEDS INCLUDING COUMADIN.  NCM WILL FOLLOW.

## 2012-04-06 NOTE — Progress Notes (Signed)
Called pharmacist at [phone] about warfarin dosing. Per Pharmacist patient will not receive an evening dose due to dose given at 0400 am 04/06/12 (see MAR). Will continue to monitor patient.

## 2012-04-06 NOTE — Progress Notes (Addendum)
CRITICAL VALUE ALERT  Critical value received:  Heparin Unfractionated >2.0  Date of notification:  04/06/2012  Time of notification:  1212  Critical value read back: Yes  Nurse who received alert:  Jeryl Columbia, RN  MD notified (1st page):  At bedside 1212  Time of first page:  N/A  MD notified (2nd page): Pharmacist  Time of second page: 1214  Responding MD: Vernona Rieger  Time MD responded:  1214

## 2012-04-06 NOTE — Progress Notes (Signed)
CSW recvd referral re: pt not affording meds.  CSW referred this req to [facility], [employer] who will f/u with pt. Vickii Penna, LCSWA [phone]  Clinical Social Work

## 2012-04-06 NOTE — Progress Notes (Signed)
ANTICOAGULATION CONSULT NOTE - Follow Up Consult  Pharmacy Consult for coumadin and heparin  Indication: pulmonary embolus  No Known Allergies  Patient Measurements: Height: 6' (182.9 cm) Weight: 209 lb 7 oz (95 kg) IBW/kg (Calculated) : 77.6   Vital Signs: Temp: 98.1 F (36.7 C) (08/15 1955) Temp src: Oral (08/15 1955) BP: 136/77 mmHg (08/15 1955) Pulse Rate: 75  (08/15 1955)  Labs:  Basename 04/06/12 2112 04/06/12 1020 04/06/12 0359 04/05/12 2257 04/05/12 1743  HGB -- -- 14.9 -- 16.4  HCT -- -- 42.6 -- 47.0  PLT -- -- 249 -- 246  APTT -- -- -- 26 --  LABPROT -- -- -- 13.5 --  INR -- -- -- 1.01 --  HEPARINUNFRC 1.07* >2.00* -- -- --  CREATININE -- -- 1.11 -- 1.25  CKTOTAL -- -- 178 -- --  CKMB -- -- 3.1 -- --  TROPONINI -- -- <0.30 -- --    Estimated Creatinine Clearance: 111.1 ml/min (by C-G formula based on Cr of 1.11).   Medical History: Past Medical History  Diagnosis Date  . DVT (deep venous thrombosis)   . Seasonal allergies     Medications:  Prescriptions prior to admission  Medication Sig Dispense Refill  . warfarin (COUMADIN) 5 MG tablet Take 1 tablet (5 mg total) by mouth daily.  30 tablet  0    Assessment: 36 yo male with previous hx of PE non-compliant with coumadin (off >3 weeks) presenting with bilateral PE's. Heparin level 1.07 still greater than goal.   Goal of Therapy:  INR 2-3 Heparin level 0.3-0.7 units/ml Monitor platelets by anticoagulation protocol: Yes   Plan:  Hold heparin x 1 hour per protocol, then resume at 1250 units/hr.  Merilynn Finland, [place] P[place]ar Stillinger 04/06/2012,10:00 PM

## 2012-04-06 NOTE — Progress Notes (Signed)
Pt seen and examined Admitted this am by Dr.Kakrakandy Continue IV heparin/coumadin Case manager consult,  lovenox at discharge  Zannie Cove, MD [phone]

## 2012-04-06 NOTE — Progress Notes (Signed)
ANTICOAGULATION CONSULT NOTE - Initial Consult  Pharmacy Consult for coumadin and heparin  Indication: pulmonary embolus  No Known Allergies  Patient Measurements:   Heparin Dosing Weight:   Vital Signs: Temp: 98 F (36.7 C) (08/14 2354) Temp src: Oral (08/14 2354) BP: 127/84 mmHg (08/15 0230) Pulse Rate: 65  (08/15 0230)  Labs:  Basename 04/05/12 2257 04/05/12 1743  HGB -- 16.4  HCT -- 47.0  PLT -- 246  APTT 26 --  LABPROT 13.5 --  INR 1.01 --  HEPARINUNFRC -- --  CREATININE -- 1.25  CKTOTAL -- --  CKMB -- --  TROPONINI -- --    The CrCl is unknown because both a height and weight (above a minimum accepted value) are required for this calculation.   Medical History: Past Medical History  Diagnosis Date  . DVT (deep venous thrombosis)   . Seasonal allergies     Medications:   (Not in a hospital admission)  Assessment: 36 yo male with previous hx of PE non-compliant with coumadin (off >3 weeks) presenting with bilateral pe's Goal of Therapy:  INR 2-3 Heparin level 0.3-0.7 units/ml Monitor platelets by anticoagulation protocol: Yes   Plan:  Continue heparin at 18units/kg/hr and check HL at 0900. Coumadin 10 mg x1  Daily inr to start 8/16  Daily HL/cbc starting 8/16 with am labs.  Janice Coffin 04/06/2012,3:00 AM

## 2012-04-06 NOTE — Progress Notes (Signed)
Received critical lab value: heparin unfractionated >2.0. Informed physician at bedside and spoke to pharmacist, Vernona Rieger. Pharmacist instructed to hold heparin drip for 1 hour and new orders would be entered for restart rate. Will continue to monitor patient.

## 2012-04-06 NOTE — Progress Notes (Addendum)
ANTICOAGULATION CONSULT NOTE - Follow Up Consult  Pharmacy Consult for coumadin and heparin  Indication: pulmonary embolus  No Known Allergies  Patient Measurements: Height: 6' (182.9 cm) Weight: 209 lb 7 oz (95 kg) IBW/kg (Calculated) : 77.6   Vital Signs: Temp: 98 F (36.7 C) (08/15 0320) Temp src: Oral (08/15 0320) BP: 127/74 mmHg (08/15 0320) Pulse Rate: 85  (08/15 0320)  Labs:  Basename 04/06/12 1020 04/06/12 0359 04/05/12 2257 04/05/12 1743  HGB -- 14.9 -- 16.4  HCT -- 42.6 -- 47.0  PLT -- 249 -- 246  APTT -- -- 26 --  LABPROT -- -- 13.5 --  INR -- -- 1.01 --  HEPARINUNFRC >2.00* -- -- --  CREATININE -- 1.11 -- 1.25  CKTOTAL -- 178 -- --  CKMB -- 3.1 -- --  TROPONINI -- <0.30 -- --    Estimated Creatinine Clearance: 111.1 ml/min (by C-G formula based on Cr of 1.11).   Medical History: Past Medical History  Diagnosis Date  . DVT (deep venous thrombosis)   . Seasonal allergies     Medications:  Prescriptions prior to admission  Medication Sig Dispense Refill  . warfarin (COUMADIN) 5 MG tablet Take 1 tablet (5 mg total) by mouth daily.  30 tablet  0    Assessment: 36 yo male with previous hx of PE non-compliant with coumadin (off >3 weeks) presenting with bilateral pe's.  Initial heparin level >2.0.  Heparin infusing properly, lab drawn correctly. Goal of Therapy:  INR 2-3 Heparin level 0.3-0.7 units/ml Monitor platelets by anticoagulation protocol: Yes   Plan:  Hold heparin for 1 hour. Restart at 1450 units/hr.  Check heparin level 6 hours after restarted. Coumadin 10 mg x1 given earlier today. Daily inr to start 8/16  Daily HL/cbc starting 8/16 with am labs.  Talbert Cage Poteet 04/06/2012,12:32 PM

## 2012-04-06 NOTE — H&P (Signed)
Richard [name] is an 35 y.o. male.   Patient seen and examined on April 06, 2012 at 2:25 AM. PCP - none. Chief Complaint: Chest pain. HPI: 36 year old male with known history of PE who has been on Coumadin off and on for last one year presented with complaints of chest pain. Patient states over the last 2 weeks he's been off Coumadin. Patient has no PCP and gets his Coumadin filled during ER visits. Chest pain started off yesterday morning left-sided anterior chest wall. The pain has no relation to exertion or inspiration. Since the pain is persistent he came to the ER. CT angiogram of the chest shows bilateral PE and has been admitted for further management. Patient at this time is hemodynamically stable. Patient has been already started on IV heparin. Patient otherwise denies any palpitations dizziness shortness of breath nausea vomiting or abdominal pain.  Past Medical History  Diagnosis Date  . DVT (deep venous thrombosis)   . Seasonal allergies     History reviewed. No pertinent past surgical history.  Family History  Problem Relation Age of Onset  . Other Mother   . Other Father   . Pulmonary embolism Other    Social History:  reports that he has been smoking.  He does not have any smokeless tobacco history on file. He reports that he drinks alcohol. He reports that he does not use illicit drugs.  Allergies: No Known Allergies   (Not in a hospital admission)  Results for orders placed during the hospital encounter of 04/05/12 (from the past 48 hour(s))  CBC WITH DIFFERENTIAL     Status: Abnormal   Collection Time   04/05/12  5:43 PM      Component Value Range Comment   WBC 14.0 (*) 4.0 - 10.5 K/uL    RBC 5.27  4.22 - 5.81 MIL/uL    Hemoglobin 16.4  13.0 - 17.0 g/dL    HCT 04.5  40.9 - 81.1 %    MCV 89.2  78.0 - 100.0 fL    MCH 31.1  26.0 - 34.0 pg    MCHC 34.9  30.0 - 36.0 g/dL    RDW 91.4  78.2 - 95.6 %    Platelets 246  150 - 400 K/uL    Neutrophils Relative 58  43 -  77 %    Lymphocytes Relative 29  12 - 46 %    Monocytes Relative 9  3 - 12 %    Eosinophils Relative 3  0 - 5 %    Basophils Relative 1  0 - 1 %    Neutro Abs 8.1 (*) 1.7 - 7.7 K/uL    Lymphs Abs 4.1 (*) 0.7 - 4.0 K/uL    Monocytes Absolute 1.3 (*) 0.1 - 1.0 K/uL    Eosinophils Absolute 0.4  0.0 - 0.7 K/uL    Basophils Absolute 0.1  0.0 - 0.1 K/uL    WBC Morphology ATYPICAL LYMPHOCYTES      Smear Review LARGE PLATELETS PRESENT     COMPREHENSIVE METABOLIC PANEL     Status: Abnormal   Collection Time   04/05/12  5:43 PM      Component Value Range Comment   Sodium 141  135 - 145 mEq/L    Potassium 4.0  3.5 - 5.1 mEq/L    Chloride 105  96 - 112 mEq/L    CO2 27  19 - 32 mEq/L    Glucose, Bld 91  70 - 99 mg/dL    BUN  17  6 - 23 mg/dL    Creatinine, Ser 9.81  0.50 - 1.35 mg/dL    Calcium 9.1  8.4 - 19.1 mg/dL    Total Protein 6.5  6.0 - 8.3 g/dL    Albumin 3.7  3.5 - 5.2 g/dL    AST 23  0 - 37 U/L    ALT 19  0 - 53 U/L    Alkaline Phosphatase 56  39 - 117 U/L    Total Bilirubin 0.3  0.3 - 1.2 mg/dL    GFR calc non Af Amer 73 (*) >90 mL/min    GFR calc Af Amer 85 (*) >90 mL/min   POCT I-STAT TROPONIN I     Status: Normal   Collection Time   04/05/12  7:51 PM      Component Value Range Comment   Troponin i, poc 0.00  0.00 - 0.08 ng/mL    Comment 3            APTT     Status: Normal   Collection Time   04/05/12 10:57 PM      Component Value Range Comment   aPTT 26  24 - 37 seconds   PROTIME-INR     Status: Normal   Collection Time   04/05/12 10:57 PM      Component Value Range Comment   Prothrombin Time 13.5  11.6 - 15.2 seconds    INR 1.01  0.00 - 1.49   POCT I-STAT TROPONIN I     Status: Normal   Collection Time   04/05/12 11:14 PM      Component Value Range Comment   Troponin i, poc 0.00  0.00 - 0.08 ng/mL    Comment 3             Dg Chest 2 View  04/05/2012  *RADIOLOGY REPORT*  Clinical Data: Chest pain.  CHEST - 2 VIEW  Comparison: CT chest 12/15/2011 and plain film  chest 09/18/2010.  Findings: The lungs are clear.  No pneumothorax or pleural fluid. Heart size is normal.  No focal bony abnormality.  IMPRESSION: Negative exam.  Original Report Authenticated By: Bernadene Bell. Maricela Curet, M.D.   Ct Angio Chest W/cm &/or Wo Cm  04/06/2012  *RADIOLOGY REPORT*  Clinical Data: Chest pain  CT ANGIOGRAPHY CHEST  Technique:  Multidetector CT imaging of the chest using the standard protocol during bolus administration of intravenous contrast. Multiplanar reconstructed images including MIPs were obtained and reviewed to evaluate the vascular anatomy.  Contrast: OMNIPAQUE IOHEXOL 350 MG/ML SOLN  Comparison: 12/15/2011  Findings: There is good contrast opacification of pulmonary artery branches.  There is a nonocclusive embolus in the right pulmonary artery extending into right middle and lower lobe segmental branches. Probably also a smaller segmental embolus in the medial basal segment left lower lobe pulmonary artery branch.  There is early contrast opacification of the thoracic aorta, without evidence of dissection or aneurysm.  No pleural or pericardial effusion.  No hilar or mediastinal adenopathy.  Lungs are clear. Thoracic spine and sternum are intact.  Visualized upper abdomen unremarkable.  IMPRESSION:  Bilateral segmental pulmonary emboli, right greater than left. I telephoned the critical test results to Dr. Manus Gunning at the time of interpretation.  Original Report Authenticated By: Osa Craver, M.D.    Review of Systems  Constitutional: Negative.   HENT: Negative.   Eyes: Negative.   Respiratory: Negative.   Cardiovascular: Positive for chest pain.  Gastrointestinal: Negative.   Genitourinary:  Negative.   Musculoskeletal: Negative.   Skin: Negative.   Neurological: Negative.   Endo/Heme/Allergies: Negative.   Psychiatric/Behavioral: Negative.     Blood pressure 132/80, pulse 68, temperature 98 F (36.7 C), temperature source Oral, resp. rate 21,  SpO2 96.00%. Physical Exam  Constitutional: He is oriented to person, place, and time. He appears well-developed and well-nourished. No distress.  HENT:  Head: Normocephalic and atraumatic.  Right Ear: External ear normal.  Left Ear: External ear normal.  Nose: Nose normal.  Mouth/Throat: Oropharynx is clear and moist. No oropharyngeal exudate.  Eyes: Conjunctivae are normal. Pupils are equal, round, and reactive to light. Right eye exhibits no discharge. Left eye exhibits no discharge. No scleral icterus.  Neck: Normal range of motion. Neck supple.  Cardiovascular: Normal rate and regular rhythm.   Respiratory: Effort normal and breath sounds normal. No respiratory distress. He has no wheezes. He has no rales.  GI: Soft. Bowel sounds are normal. He exhibits no distension. There is no tenderness. There is no rebound.  Musculoskeletal: Normal range of motion. He exhibits no edema and no tenderness.  Neurological: He is alert and oriented to person, place, and time.       Moves all extremities.  Skin: Skin is warm and dry. He is not diaphoretic.  Psychiatric: His behavior is normal.     Assessment/Plan #1. Pulmonary embolism - patient has been started on heparin and I have ordered Coumadin to be dosed by pharmacy. Social worker consult has been requested to help with patient's discharge medications. Patient probably will need to be on lifelong Coumadin. #2. Tobacco abuse - strongly advised to quit smoking.  CODE STATUS - full code.  Genisis Sonnier N. 04/06/2012, 2:33 AM

## 2012-04-07 LAB — CBC
MCV: 90 fL (ref 78.0–100.0)
Platelets: 262 10*3/uL (ref 150–400)
RBC: 4.79 MIL/uL (ref 4.22–5.81)
RDW: 13.3 % (ref 11.5–15.5)
WBC: 11.1 10*3/uL — ABNORMAL HIGH (ref 4.0–10.5)

## 2012-04-07 LAB — HEPARIN LEVEL (UNFRACTIONATED): Heparin Unfractionated: 0.37 IU/mL (ref 0.30–0.70)

## 2012-04-07 LAB — PROTIME-INR
INR: 1.24 (ref 0.00–1.49)
Prothrombin Time: 15.9 seconds — ABNORMAL HIGH (ref 11.6–15.2)

## 2012-04-07 MED ORDER — WARFARIN SODIUM 10 MG PO TABS
10.0000 mg | ORAL_TABLET | Freq: Every day | ORAL | Status: DC
  Administered 2012-04-07: 10 mg via ORAL
  Filled 2012-04-07 (×2): qty 1

## 2012-04-07 MED ORDER — ENOXAPARIN SODIUM 150 MG/ML ~~LOC~~ SOLN
150.0000 mg | SUBCUTANEOUS | Status: DC
  Administered 2012-04-07 – 2012-04-10 (×4): 150 mg via SUBCUTANEOUS
  Filled 2012-04-07 (×4): qty 1

## 2012-04-07 NOTE — Progress Notes (Signed)
Subjective: Doing ok, denies any chest pain/SoB  Objective: Vital signs in last 24 hours: Temp:  [97.4 F (36.3 C)-98.1 F (36.7 C)] 97.4 F (36.3 C) (08/16 0520) Pulse Rate:  [64-80] 64  (08/16 0520) Resp:  [16-20] 16  (08/16 0520) BP: (100-136)/(59-77) 100/59 mmHg (08/16 0520) SpO2:  [95 %] 95 % (08/16 0520) Weight change:  Last BM Date: 04/04/12  Intake/Output from previous day: 08/15 0701 - 08/16 0700 In: 2438 [P.O.:1680; I.V.:758] Out: 2275 [Urine:2275] Total I/O In: 360 [P.O.:360] Out: -    Physical Exam: General: Alert, awake, oriented x3, in no acute distress. HEENT: No bruits, no goiter. Heart: Regular rate and rhythm, without murmurs, rubs, gallops. Lungs: Clear to auscultation bilaterally. Abdomen: Soft, nontender, nondistended, positive bowel sounds. Extremities: No clubbing cyanosis or edema with positive pedal pulses. Neuro: Grossly intact, nonfocal.    Lab Results: Basic Metabolic Panel:  Basename 04/06/12 0359 04/05/12 1743  NA 138 141  K 3.5 4.0  CL 107 105  CO2 23 27  GLUCOSE 130* 91  BUN 16 17  CREATININE 1.11 1.25  CALCIUM 8.2* 9.1  MG -- --  PHOS -- --   Liver Function Tests:  [facility] 04/06/12 0359 04/05/12 1743  AST 15 23  ALT 14 19  ALKPHOS 45 56  BILITOT 0.5 0.3  PROT 5.3* 6.5  ALBUMIN 3.0* 3.7   No results found for this basename: LIPASE:2,AMYLASE:2 in the last 72 hours No results found for this basename: AMMONIA:2 in the last 72 hours CBC:  Basename 04/07/12 0512 04/06/12 0359 04/05/12 1743  WBC 11.1* 12.8* --  NEUTROABS -- -- 8.1*  HGB 14.7 14.9 --  HCT 43.1 42.6 --  MCV 90.0 88.6 --  PLT 262 249 --   Cardiac Enzymes:  Basename 04/06/12 0359  CKTOTAL 178  CKMB 3.1  CKMBINDEX --  TROPONINI <0.30   BNP: No results found for this basename: PROBNP:3 in the last 72 hours D-Dimer: No results found for this basename: DDIMER:2 in the last 72 hours CBG: No results found for this basename: GLUCAP:6 in the last 72  hours Hemoglobin A1C: No results found for this basename: HGBA1C in the last 72 hours Fasting Lipid Panel: No results found for this basename: CHOL,HDL,LDLCALC,TRIG,CHOLHDL,LDLDIRECT in the last 72 hours Thyroid Function Tests: No results found for this basename: TSH,T4TOTAL,FREET4,T3FREE,THYROIDAB in the last 72 hours Anemia Panel: No results found for this basename: VITAMINB12,FOLATE,FERRITIN,TIBC,IRON,RETICCTPCT in the last 72 hours Coagulation:  Basename 04/07/12 0512 04/05/12 2257  LABPROT 15.9* 13.5  INR 1.24 1.01   Urine Drug Screen: Drugs of Abuse  No results found for this basename: labopia, cocainscrnur, labbenz, amphetmu, thcu, labbarb    Alcohol Level: No results found for this basename: ETH:2 in the last 72 hours Urinalysis: No results found for this basename: COLORURINE:2,APPERANCEUR:2,LABSPEC:2,PHURINE:2,GLUCOSEU:2,HGBUR:2,BILIRUBINUR:2,KETONESUR:2,PROTEINUR:2,UROBILINOGEN:2,NITRITE:2,LEUKOCYTESUR:2 in the last 72 hours  No results found for this or any previous visit (from the past 240 hour(s)).  Studies/Results: Dg Chest 2 View  04/05/2012  *RADIOLOGY REPORT*  Clinical Data: Chest pain.  CHEST - 2 VIEW  Comparison: CT chest 12/15/2011 and plain film chest 09/18/2010.  Findings: The lungs are clear.  No pneumothorax or pleural fluid. Heart size is normal.  No focal bony abnormality.  IMPRESSION: Negative exam.  Original Report Authenticated By: Bernadene Bell. Maricela Curet, M.D.   Ct Angio Chest W/cm &/or Wo Cm  04/06/2012  *RADIOLOGY REPORT*  Clinical Data: Chest pain  CT ANGIOGRAPHY CHEST  Technique:  Multidetector CT imaging of the chest using the standard protocol  during bolus administration of intravenous contrast. Multiplanar reconstructed images including MIPs were obtained and reviewed to evaluate the vascular anatomy.  Contrast: OMNIPAQUE IOHEXOL 350 MG/ML SOLN  Comparison: 12/15/2011  Findings: There is good contrast opacification of pulmonary artery branches.   There is a nonocclusive embolus in the right pulmonary artery extending into right middle and lower lobe segmental branches. Probably also a smaller segmental embolus in the medial basal segment left lower lobe pulmonary artery branch.  There is early contrast opacification of the thoracic aorta, without evidence of dissection or aneurysm.  No pleural or pericardial effusion.  No hilar or mediastinal adenopathy.  Lungs are clear. Thoracic spine and sternum are intact.  Visualized upper abdomen unremarkable.  IMPRESSION:  Bilateral segmental pulmonary emboli, right greater than left. I telephoned the critical test results to Dr. Manus Gunning at the time of interpretation.  Original Report Authenticated By: Osa Craver, M.D.    Medications: Scheduled Meds:   . sodium chloride   Intravenous STAT  . enoxaparin (LOVENOX) injection  150 mg Subcutaneous Q24H  . sodium chloride  3 mL Intravenous Q12H  . warfarin  10 mg Oral q1800  . warfarin   Does not apply Once  . Warfarin - Pharmacist Dosing Inpatient   Does not apply q1800   Continuous Infusions:   . DISCONTD: sodium chloride 50 mL/hr at 04/06/12 2308  . DISCONTD: heparin Stopped (04/06/12 1226)  . DISCONTD: heparin 1,650 Units/hr (04/06/12 1329)  . DISCONTD: heparin 15 Units/kg/hr (04/06/12 1526)  . DISCONTD: heparin    . DISCONTD: heparin 1,250 Units/hr (04/07/12 1009)   PRN Meds:.acetaminophen, acetaminophen, ondansetron (ZOFRAN) IV, ondansetron  Assessment/Plan: #1. Pulmonary embolism -  Recurrent after stopping coumadin Change to SQ lovenox today, day 2 of bridge today with coumadin Unfortunately cannot afford meds and no PCP yet for INR check and FU Case manager assisting with resources and follow up  #2. Tobacco abuse - strongly advised to quit smoking    LOS: 2 days   [facility] Pager: [phone] 04/07/2012, 11:54 AM

## 2012-04-07 NOTE — Progress Notes (Signed)
ANTICOAGULATION CONSULT NOTE - Follow Up Consult  Pharmacy Consult for heparin  Indication: pulmonary embolus  No Known Allergies  Patient Measurements: Height: 6' (182.9 cm) Weight: 209 lb 7 oz (95 kg) IBW/kg (Calculated) : 77.6   Vital Signs: Temp: 97.4 F (36.3 C) (08/16 0520) Temp src: Oral (08/16 0520) BP: 100/59 mmHg (08/16 0520) Pulse Rate: 64  (08/16 0520)  Labs:  Basename 04/07/12 0512 04/06/12 2112 04/06/12 1020 04/06/12 0359 04/05/12 2257 04/05/12 1743  HGB 14.7 -- -- 14.9 -- --  HCT 43.1 -- -- 42.6 -- 47.0  PLT 262 -- -- 249 -- 246  APTT -- -- -- -- 26 --  LABPROT 15.9* -- -- -- 13.5 --  INR 1.24 -- -- -- 1.01 --  HEPARINUNFRC 0.57 1.07* >2.00* -- -- --  CREATININE -- -- -- 1.11 -- 1.25  CKTOTAL -- -- -- 178 -- --  CKMB -- -- -- 3.1 -- --  TROPONINI -- -- -- <0.30 -- --    Estimated Creatinine Clearance: 111.1 ml/min (by C-G formula based on Cr of 1.11).  Assessment: 36 yo male with previous hx of PE non-compliant with coumadin (off >3 weeks) presenting with bilateral PE's. Heparin level is now at goal at 0.57. No bleeding noted.  Goal of Therapy:  Heparin level 0.3-0.7 Monitor platelets by anticoagulation protocol: Yes   Plan:  1. Continue heparin at 1250 units/hr 2. Check a heparin level today to confirm level stays therapeutic  Lysle Pearl, PharmD, BCPS Pager # [phone] 04/07/2012 7:18 AM

## 2012-04-07 NOTE — Progress Notes (Signed)
ANTICOAGULATION CONSULT NOTE - Follow Up Consult  Pharmacy Consult for on hep/coum , consulted for home lovenox dose Indication: pulmonary embolus  No Known Allergies  Patient Measurements: Height: 6' (182.9 cm) Weight: 209 lb 7 oz (95 kg) IBW/kg (Calculated) : 77.6   Vital Signs: Temp: 97.4 F (36.3 C) (08/16 0520) Temp src: Oral (08/16 0520) BP: 100/59 mmHg (08/16 0520) Pulse Rate: 64  (08/16 0520)  Labs:  Basename 04/07/12 0512 04/06/12 2112 04/06/12 1020 04/06/12 0359 04/05/12 2257 04/05/12 1743  HGB 14.7 -- -- 14.9 -- --  HCT 43.1 -- -- 42.6 -- 47.0  PLT 262 -- -- 249 -- 246  APTT -- -- -- -- 26 --  LABPROT 15.9* -- -- -- 13.5 --  INR 1.24 -- -- -- 1.01 --  HEPARINUNFRC 0.57 1.07* >2.00* -- -- --  CREATININE -- -- -- 1.11 -- 1.25  CKTOTAL -- -- -- 178 -- --  CKMB -- -- -- 3.1 -- --  TROPONINI -- -- -- <0.30 -- --    Estimated Creatinine Clearance: 111.1 ml/min (by C-G formula based on Cr of 1.11).    Assessment: Asked by case management to rec home LMWH dose in pt with B PE's.  Currently on heparin drip and coumadin day # 2/5 of minimum overlap for PE treatment.  Goal of Therapy:  INR 2-3  Monitor platelets by anticoagulation protocol: Yes   Plan:  Rec LMWH 150 mg sq q24 hours x a minimum of 4 days to treat PE. rec DC home on coumadin 10 mg daily and recheck INR on Monday 04/10/12. Herby Abraham, Pharm.D. [phone] 04/07/2012 8:42 AM

## 2012-04-08 DIAGNOSIS — I2699 Other pulmonary embolism without acute cor pulmonale: Secondary | ICD-10-CM

## 2012-04-08 LAB — CBC
HCT: 44.7 % (ref 39.0–52.0)
MCHC: 34.5 g/dL (ref 30.0–36.0)
MCV: 89.4 fL (ref 78.0–100.0)
Platelets: 289 10*3/uL (ref 150–400)
RDW: 13.2 % (ref 11.5–15.5)
WBC: 9.7 10*3/uL (ref 4.0–10.5)

## 2012-04-08 LAB — PROTIME-INR: INR: 1.22 (ref 0.00–1.49)

## 2012-04-08 MED ORDER — WARFARIN SODIUM 2.5 MG PO TABS
12.5000 mg | ORAL_TABLET | Freq: Once | ORAL | Status: AC
  Administered 2012-04-08: 12.5 mg via ORAL
  Filled 2012-04-08: qty 1

## 2012-04-08 NOTE — Progress Notes (Signed)
Subjective: Doing ok, denies any chest pain/SoB  Objective: Vital signs in last 24 hours: Temp:  [97.3 F (36.3 C)-97.7 F (36.5 C)] 97.3 F (36.3 C) (08/17 0411) Pulse Rate:  [66-73] 66  (08/17 0411) Resp:  [18-20] 19  (08/17 0411) BP: (114-127)/(65-68) 127/65 mmHg (08/17 0411) SpO2:  [94 %-97 %] 94 % (08/17 0411) Weight:  [90.629 kg (199 lb 12.8 oz)] 90.629 kg (199 lb 12.8 oz) (08/17 0500) Weight change:  Last BM Date: 04/07/12  Intake/Output from previous day: 08/16 0701 - 08/17 0700 In: 1440 [P.O.:1440] Out: 600 [Urine:600]     Physical Exam: General: Alert, awake, oriented x3, in no acute distress. HEENT: No bruits, no goiter. Heart: Regular rate and rhythm, without murmurs, rubs, gallops. Lungs: Clear to auscultation bilaterally. Abdomen: Soft, nontender, nondistended, positive bowel sounds. Extremities: No clubbing cyanosis or edema with positive pedal pulses. Neuro: Grossly intact, nonfocal.    Lab Results: Basic Metabolic Panel:  Basename 04/06/12 0359 04/05/12 1743  NA 138 141  K 3.5 4.0  CL 107 105  CO2 23 27  GLUCOSE 130* 91  BUN 16 17  CREATININE 1.11 1.25  CALCIUM 8.2* 9.1  MG -- --  PHOS -- --   Liver Function Tests:  [facility] 04/06/12 0359 04/05/12 1743  AST 15 23  ALT 14 19  ALKPHOS 45 56  BILITOT 0.5 0.3  PROT 5.3* 6.5  ALBUMIN 3.0* 3.7   No results found for this basename: LIPASE:2,AMYLASE:2 in the last 72 hours No results found for this basename: AMMONIA:2 in the last 72 hours CBC:  Basename 04/08/12 0620 04/07/12 0512 04/05/12 1743  WBC 9.7 11.1* --  NEUTROABS -- -- 8.1*  HGB 15.4 14.7 --  HCT 44.7 43.1 --  MCV 89.4 90.0 --  PLT 289 262 --   Cardiac Enzymes:  Basename 04/06/12 0359  CKTOTAL 178  CKMB 3.1  CKMBINDEX --  TROPONINI <0.30   BNP: No results found for this basename: PROBNP:3 in the last 72 hours D-Dimer: No results found for this basename: DDIMER:2 in the last 72 hours CBG: No results found for this  basename: GLUCAP:6 in the last 72 hours Hemoglobin A1C: No results found for this basename: HGBA1C in the last 72 hours Fasting Lipid Panel: No results found for this basename: CHOL,HDL,LDLCALC,TRIG,CHOLHDL,LDLDIRECT in the last 72 hours Thyroid Function Tests: No results found for this basename: TSH,T4TOTAL,FREET4,T3FREE,THYROIDAB in the last 72 hours Anemia Panel: No results found for this basename: VITAMINB12,FOLATE,FERRITIN,TIBC,IRON,RETICCTPCT in the last 72 hours Coagulation:  Basename 04/08/12 0620 04/07/12 0512  LABPROT 15.7* 15.9*  INR 1.22 1.24   Urine Drug Screen: Drugs of Abuse  No results found for this basename: labopia,  cocainscrnur,  labbenz,  amphetmu,  thcu,  labbarb    Alcohol Level: No results found for this basename: ETH:2 in the last 72 hours Urinalysis: No results found for this basename: COLORURINE:2,APPERANCEUR:2,LABSPEC:2,PHURINE:2,GLUCOSEU:2,HGBUR:2,BILIRUBINUR:2,KETONESUR:2,PROTEINUR:2,UROBILINOGEN:2,NITRITE:2,LEUKOCYTESUR:2 in the last 72 hours  No results found for this or any previous visit (from the past 240 hour(s)).  Studies/Results: No results found.  Medications: Scheduled Meds:    . enoxaparin (LOVENOX) injection  150 mg Subcutaneous Q24H  . sodium chloride  3 mL Intravenous Q12H  . warfarin  10 mg Oral q1800  . Warfarin - Pharmacist Dosing Inpatient   Does not apply q1800   Continuous Infusions:    . DISCONTD: sodium chloride 50 mL/hr at 04/06/12 2308  . DISCONTD: heparin Stopped (04/07/12 1421)   PRN Meds:.acetaminophen, acetaminophen, ondansetron (ZOFRAN) IV, ondansetron  Assessment/Plan: #1.  Pulmonary embolism -  Recurrent after stopping coumadin Continue SQ lovenox today, day 3 of bridge with coumadin Unfortunately cannot afford meds and no PCP yet for INR check and FU Case manager assisting with resources and follow up  #2. Tobacco abuse - strongly advised to quit smoking    LOS: 3 days   [facility] Pager: [phone] 04/08/2012, 10:52 AM

## 2012-04-08 NOTE — Progress Notes (Signed)
ANTICOAGULATION CONSULT NOTE - Follow Up Consult  Pharmacy Consult : Lovenox and coumadin Indication: pulmonary embolus  No Known Allergies  Patient Measurements: Height: 6' (182.9 cm) Weight: 199 lb 12.8 oz (90.629 kg) IBW/kg (Calculated) : 77.6   Vital Signs: Temp: 97.3 F (36.3 C) (08/17 0411) Temp src: Oral (08/17 0411) BP: 127/65 mmHg (08/17 0411) Pulse Rate: 66  (08/17 0411)  Labs:  Basename 04/08/12 0620 04/07/12 1115 04/07/12 0512 04/06/12 2112 04/06/12 0359 04/05/12 2257 04/05/12 1743  HGB 15.4 -- 14.7 -- -- -- --  HCT 44.7 -- 43.1 -- 42.6 -- --  PLT 289 -- 262 -- 249 -- --  APTT -- -- -- -- -- 26 --  LABPROT 15.7* -- 15.9* -- -- 13.5 --  INR 1.22 -- 1.24 -- -- 1.01 --  HEPARINUNFRC -- 0.37 0.57 1.07* -- -- --  CREATININE -- -- -- -- 1.11 -- 1.25  CKTOTAL -- -- -- -- 178 -- --  CKMB -- -- -- -- 3.1 -- --  TROPONINI -- -- -- -- <0.30 -- --    Estimated Creatinine Clearance: 102 ml/min (by C-G formula based on Cr of 1.11).    Assessment: 36 yo male with previous hx of PE non-compliant with coumadin (off >3 weeks) presenting with bilateral PE's.  On day # 3/5 of minimum overlap for PE treatment.  Heparin drip was converted to lovenox 8/16.  INR = 1.22 after 2 doses of coumadin 10 mg.  CBC stable.  No bleeding reported. Major barrier to discharge is he is unable to afford a PCP to f/u his coumadin.  Goal of Therapy:  INR 2-3  Monitor platelets by anticoagulation protocol: Yes   Plan:   1. Continue LMWH 150 mg sq q24 hours -  2. Coumadin 12.5 mg po x 1 dose today 3. Daily INR 4. Today is day 3/5 minimum overlap for VTE treatment 5. If he could find a PCP to f/u coumadin he could be discharged on LMWH 150 q24 and coumadin with outpt INR f/u but he states he can't afford a co-pay. 6. Pt and father extensively educated about coumadin and lovenox on Friday 04/07/12 Herby Abraham, Pharm.D. [phone] 04/08/2012 11:01 AM

## 2012-04-09 LAB — CBC
HCT: 44.1 % (ref 39.0–52.0)
MCHC: 34.2 g/dL (ref 30.0–36.0)
MCV: 90 fL (ref 78.0–100.0)
Platelets: 283 10*3/uL (ref 150–400)
RDW: 13.3 % (ref 11.5–15.5)

## 2012-04-09 LAB — PROTIME-INR: INR: 1.77 — ABNORMAL HIGH (ref 0.00–1.49)

## 2012-04-09 MED ORDER — WARFARIN SODIUM 10 MG PO TABS
10.0000 mg | ORAL_TABLET | Freq: Once | ORAL | Status: AC
  Administered 2012-04-09: 10 mg via ORAL
  Filled 2012-04-09 (×2): qty 1

## 2012-04-09 MED ORDER — ENOXAPARIN (LOVENOX) PATIENT EDUCATION KIT
PACK | Freq: Once | Status: AC
  Administered 2012-04-09: 14:00:00
  Filled 2012-04-09: qty 1

## 2012-04-09 NOTE — Progress Notes (Signed)
Subjective: Doing ok, denies any chest pain/SoB  Objective: Vital signs in last 24 hours: Temp:  [97.5 F (36.4 C)-98.3 F (36.8 C)] 98.1 F (36.7 C) (08/18 0429) Pulse Rate:  [66-80] 72  (08/18 0429) Resp:  [16-18] 18  (08/18 0429) BP: (108-122)/(70-77) 108/70 mmHg (08/18 0429) SpO2:  [96 %-97 %] 96 % (08/18 0429) Weight:  [90.538 kg (199 lb 9.6 oz)] 90.538 kg (199 lb 9.6 oz) (08/18 0429) Weight change: -0.091 kg (-3.2 oz) Last BM Date: 04/08/12  Intake/Output from previous day: 08/17 0701 - 08/18 0700 In: 480 [P.O.:480] Out: 350 [Urine:350]     Physical Exam: General: Alert, awake, oriented x3, in no acute distress. HEENT: No bruits, no goiter. Heart: Regular rate and rhythm, without murmurs, rubs, gallops. Lungs: Clear to auscultation bilaterally. Abdomen: Soft, nontender, nondistended, positive bowel sounds. Extremities: No clubbing cyanosis or edema with positive pedal pulses. Neuro: Grossly intact, nonfocal.    Lab Results: Basic Metabolic Panel: No results found for this basename: NA:2,K:2,CL:2,CO2:2,GLUCOSE:2,BUN:2,CREATININE:2,CALCIUM:2,MG:2,PHOS:2 in the last 72 hours Liver Function Tests: No results found for this basename: AST:2,ALT:2,ALKPHOS:2,BILITOT:2,PROT:2,ALBUMIN:2 in the last 72 hours No results found for this basename: LIPASE:2,AMYLASE:2 in the last 72 hours No results found for this basename: AMMONIA:2 in the last 72 hours CBC:  Basename 04/09/12 0450 04/08/12 0620  WBC 10.5 9.7  NEUTROABS -- --  HGB 15.1 15.4  HCT 44.1 44.7  MCV 90.0 89.4  PLT 283 289   Cardiac Enzymes: No results found for this basename: CKTOTAL:3,CKMB:3,CKMBINDEX:3,TROPONINI:3 in the last 72 hours BNP: No results found for this basename: PROBNP:3 in the last 72 hours D-Dimer: No results found for this basename: DDIMER:2 in the last 72 hours CBG: No results found for this basename: GLUCAP:6 in the last 72 hours Hemoglobin A1C: No results found for this basename:  HGBA1C in the last 72 hours Fasting Lipid Panel: No results found for this basename: CHOL,HDL,LDLCALC,TRIG,CHOLHDL,LDLDIRECT in the last 72 hours Thyroid Function Tests: No results found for this basename: TSH,T4TOTAL,FREET4,T3FREE,THYROIDAB in the last 72 hours Anemia Panel: No results found for this basename: VITAMINB12,FOLATE,FERRITIN,TIBC,IRON,RETICCTPCT in the last 72 hours Coagulation:  Basename 04/09/12 0450 04/08/12 0620  LABPROT 20.9* 15.7*  INR 1.77* 1.22   Urine Drug Screen: Drugs of Abuse  No results found for this basename: labopia,  cocainscrnur,  labbenz,  amphetmu,  thcu,  labbarb    Alcohol Level: No results found for this basename: ETH:2 in the last 72 hours Urinalysis: No results found for this basename: COLORURINE:2,APPERANCEUR:2,LABSPEC:2,PHURINE:2,GLUCOSEU:2,HGBUR:2,BILIRUBINUR:2,KETONESUR:2,PROTEINUR:2,UROBILINOGEN:2,NITRITE:2,LEUKOCYTESUR:2 in the last 72 hours  No results found for this or any previous visit (from the past 240 hour(s)).  Studies/Results: No results found.  Medications: Scheduled Meds:    . enoxaparin (LOVENOX) injection  150 mg Subcutaneous Q24H  . enoxaparin   Does not apply Once  . sodium chloride  3 mL Intravenous Q12H  . warfarin  12.5 mg Oral Once  . Warfarin - Pharmacist Dosing Inpatient   Does not apply q1800  . DISCONTD: warfarin  10 mg Oral q1800   Continuous Infusions:   PRN Meds:.acetaminophen, acetaminophen, ondansetron (ZOFRAN) IV, ondansetron  Assessment/Plan: #1. Pulmonary embolism -  Recurrent after stopping coumadin Continue SQ lovenox today, day 4 of bridge with coumadin Unfortunately cannot afford meds and no PCP yet for INR check and FU Case manager assisting with resources and follow up lovenox teaching, ? Home tomorrow  #2. Tobacco abuse - strongly advised to quit smoking  DC tele   LOS: 4 days   Richard [name] [facility] Pager:  [phone] 04/09/2012, 10:03 AM

## 2012-04-09 NOTE — Progress Notes (Signed)
Richard [name] is a 36 y.o. male patient who transferred  From 2000 awake, alert  & orientated  X 3,  Independent Full Code, VSS - Blood pressure 122/78, pulse 73, temperature 98.5 F (36.9 C), temperature source Oral, resp. rate 18, height 6' (1.829 m), weight 91.037 kg (200 lb 11.2 oz), SpO2 94.00%.,IV site WDL: hand left, condition patent and no redness with a transparent dsg that's clean dry and intact.  Allergies:  No Known Allergies   Past Medical History  Diagnosis Date  . DVT (deep venous thrombosis)   . Seasonal allergies     Pt orientation to unit, room and routine. SR up x 2, fall risk assessment complete with Patient and family verbalizing understanding of risks associated with falls. Pt verbalizes an understanding of how to use the call bell and to call for help before getting out of bed.  Skin, clean-dry- intact without evidence of bruising, or skin tears.   No evidence of skin break down noted on exam.     Will cont to monitor and assist as needed.  Cindra Eves, RN 04/09/2012 4:25 PM

## 2012-04-09 NOTE — Progress Notes (Signed)
ANTICOAGULATION CONSULT NOTE - Follow Up Consult  Pharmacy Consult : Lovenox and coumadin Indication: pulmonary embolus  No Known Allergies  Patient Measurements: Height: 6' (182.9 cm) Weight: 199 lb 9.6 oz (90.538 kg) IBW/kg (Calculated) : 77.6   Vital Signs: Temp: 98.1 F (36.7 C) (08/18 0429) Temp src: Oral (08/18 0429) BP: 108/70 mmHg (08/18 0429) Pulse Rate: 72  (08/18 0429)  Labs:  Basename 04/09/12 0450 04/08/12 0620 04/07/12 1115 04/07/12 0512 04/06/12 2112  HGB 15.1 15.4 -- -- --  HCT 44.1 44.7 -- 43.1 --  PLT 283 289 -- 262 --  APTT -- -- -- -- --  LABPROT 20.9* 15.7* -- 15.9* --  INR 1.77* 1.22 -- 1.24 --  HEPARINUNFRC -- -- 0.37 0.57 1.07*  CREATININE -- -- -- -- --  CKTOTAL -- -- -- -- --  CKMB -- -- -- -- --  TROPONINI -- -- -- -- --    Estimated Creatinine Clearance: 102 ml/min (by C-G formula based on Cr of 1.11).    Assessment: 36 yo male with previous hx of PE non-compliant with coumadin (off >3 weeks) presenting with bilateral PE's.  On day # 4/5 of minimum overlap for PE treatment.  Heparin drip was converted to lovenox 8/16.  INR = 1.77 after 3 doses of coumadin.  Large ~ 5 sec jump in protime.  CBC stable.  No bleeding reported. Major barrier to discharge is he is unable to afford a PCP to f/u his coumadin.  Goal of Therapy:  INR 2-3  Monitor platelets by anticoagulation protocol: Yes   Plan:   1. Continue LMWH 150 mg sq q24 hours -  2. Coumadin 10 mg po x 1 dose today 3. Daily INR 4. Today is day 4/5 minimum overlap for VTE treatment 5. If he could find a PCP to f/u coumadin he could be discharged on LMWH 150 q24 and coumadin with outpt INR f/u but he states he can't afford a co-pay. 6. Pt and father extensively educated about coumadin and lovenox on Friday 04/07/12 Herby Abraham, Pharm.D. [phone] 04/09/2012 12:32 PM

## 2012-04-10 LAB — CBC
HCT: 45.1 % (ref 39.0–52.0)
MCHC: 34.8 g/dL (ref 30.0–36.0)
MCV: 89 fL (ref 78.0–100.0)
Platelets: 287 10*3/uL (ref 150–400)
RDW: 13.3 % (ref 11.5–15.5)

## 2012-04-10 MED ORDER — WARFARIN SODIUM 5 MG PO TABS: 5.0000 mg | ORAL_TABLET | Freq: Every day | ORAL | Status: DC

## 2012-04-10 MED ORDER — WARFARIN SODIUM 2.5 MG PO TABS
12.5000 mg | ORAL_TABLET | Freq: Once | ORAL | Status: DC
  Filled 2012-04-10: qty 1

## 2012-04-10 NOTE — Progress Notes (Signed)
ANTICOAGULATION CONSULT NOTE - Follow Up Consult  Pharmacy Consult : Lovenox and coumadin Indication: pulmonary embolus  Labs:  Basename 04/10/12 0611 04/09/12 0450 04/08/12 0620 04/07/12 1115  HGB 15.7 15.1 -- --  HCT 45.1 44.1 44.7 --  PLT 287 283 289 --  APTT -- -- -- --  LABPROT 20.8* 20.9* 15.7* --  INR 1.76* 1.77* 1.22 --  HEPARINUNFRC -- -- -- 0.37  CREATININE -- -- -- --  CKTOTAL -- -- -- --  CKMB -- -- -- --  TROPONINI -- -- -- --    Assessment: 36 yo male with previous hx of PE non-compliant with coumadin (off >3 weeks) presenting with bilateral PE's.  On day # 5/5 of minimum overlap for PE treatment.  Heparin drip was converted to lovenox 8/16. INR = 1.76 which is unchanged after 4 doses of coumadin.  Summary as follows: 8/15 Warfarin 10mg  x 1 Baseline INR (1.01) 8/16 Warfarin 10mg  x 1 INR = 1.24 8/17  Warfarin 12.5mg  x1 INR = 1.22  8/18  Warfarin 10mg  x 1  INR = 1.77  Medication Review: Current medications reviewed and no noted drugs to cause major interactions with anticoagulation.  Major barrier to discharge is he is unable to afford a PCP to f/u his coumadin.  Goal of Therapy:  INR 2-3  Monitor platelets by anticoagulation protocol: Yes   Plan:  1.  Continue LMWH 150 mg sq q24 hours  2.  Coumadin 12.5 mg po x 1 dose today 3.  Daily INR 4. Pt and father extensively educated about coumadin and lovenox on Friday 04/07/12  Nadara Mustard, PharmD., MS Clinical Pharmacist Pager:  [phone] Thank you for allowing pharmacy to be part of this patients care team. 04/10/2012 10:02 AM

## 2012-04-10 NOTE — Progress Notes (Signed)
Discharge instructions reviewed with patient verbalize and understand. Prescription given to patient by Dr. Jomarie Longs also patient understands how to administer Lovenox injections patient administered   His lovenox injection today prior to being discharge all questions were answered. Skin WNL except scattered bruises on his arms.

## 2012-04-12 ENCOUNTER — Encounter: Payer: Self-pay | Admitting: Internal Medicine

## 2012-04-12 ENCOUNTER — Ambulatory Visit (INDEPENDENT_AMBULATORY_CARE_PROVIDER_SITE_OTHER): Payer: Self-pay | Admitting: Internal Medicine

## 2012-04-12 ENCOUNTER — Other Ambulatory Visit (INDEPENDENT_AMBULATORY_CARE_PROVIDER_SITE_OTHER): Payer: Self-pay

## 2012-04-12 VITALS — BP 117/77 | HR 80 | Temp 97.7°F | Ht 72.0 in | Wt 201.9 lb

## 2012-04-12 DIAGNOSIS — I80299 Phlebitis and thrombophlebitis of other deep vessels of unspecified lower extremity: Secondary | ICD-10-CM

## 2012-04-12 DIAGNOSIS — F17200 Nicotine dependence, unspecified, uncomplicated: Secondary | ICD-10-CM

## 2012-04-12 DIAGNOSIS — Z7901 Long term (current) use of anticoagulants: Secondary | ICD-10-CM

## 2012-04-12 DIAGNOSIS — I809 Phlebitis and thrombophlebitis of unspecified site: Secondary | ICD-10-CM

## 2012-04-12 DIAGNOSIS — Z720 Tobacco use: Secondary | ICD-10-CM

## 2012-04-12 DIAGNOSIS — I2699 Other pulmonary embolism without acute cor pulmonale: Secondary | ICD-10-CM

## 2012-04-12 NOTE — Patient Instructions (Signed)
You are going to be new to our clinic. We will have you take lovenox shots 1 shot in the morning and 1 shot in the evening. Today start taking coumadin. Take 2 pills every day and come back to the clinic on Friday for a check of your level. Please call us with any questions or problems at [phone].

## 2012-04-12 NOTE — Progress Notes (Signed)
Subjective:     Patient ID: Richard [name], male   DOB: Mar 24, 1976, 36 y.o.   MRN: [id number]  HPI The patient is a 36 year old male who comes in after a hospital stay. It would appear that he was hospitalized for recurrent PE and DVT. He stated that he did have his first PE when February of 2012. It's unclear if there were any precipitating factors however it does not sound like there is any precipitating factors at that time. Patient has had no surgeries in his lifetime, no prolonged immobility. He is very functional and capable individual. He then was on Coumadin for 6 month. And then lost his job and stop taking Coumadin and fell out of followup. Approximately one month later he did have repeat hospitalization for PE in August of 2013. During this visit he was started on Lovenox and Coumadin and was having an INR of 1.7 when he did leave the hospital. However there was poor to medication at discharge and he thought that he was supposed to be taking the shots until they ran out and then taking his Coumadin. He was not taking Coumadin since leaving the hospital on Monday. He does have prescription for Coumadin and is able to fill that and he is willing to take that. He is here to also establish care as he has no doctor and no followup for his Coumadin. He has no other medical problems and is not a smoker. He has had no surgeries. No trauma. On no medications other than Coumadin. He has been taking Lovenox injections since he left the hospital on Monday.  Review of Systems  Constitutional: Negative.   HENT: Negative.   Eyes: Negative.   Respiratory: Negative for cough, choking, chest tightness, shortness of breath, wheezing and stridor.   Cardiovascular: Negative for chest pain, palpitations and leg swelling.  Gastrointestinal: Negative.  Negative for nausea, vomiting, abdominal pain, diarrhea, constipation, blood in stool, abdominal distention and anal bleeding.  Musculoskeletal: Negative.   Skin:  Negative.   Neurological: Negative for dizziness, tremors, seizures, syncope, facial asymmetry, speech difficulty, weakness, light-headedness, numbness and headaches.  Hematological: Negative for adenopathy. Does not bruise/bleed easily.  Psychiatric/Behavioral: Negative.        Objective:   Physical Exam  Constitutional: He is oriented to person, place, and time. He appears well-developed and well-nourished. No distress.  HENT:  Head: Normocephalic and atraumatic.  Eyes: EOM are normal. Pupils are equal, round, and reactive to light.  Neck: Normal range of motion. Neck supple.  Cardiovascular: Normal rate and regular rhythm.   Pulmonary/Chest: Effort normal and breath sounds normal. No respiratory distress. He has no wheezes. He has no rales.  Abdominal: Soft. Bowel sounds are normal. He exhibits no distension. There is no tenderness. There is no rebound.  Musculoskeletal: Normal range of motion.  Neurological: He is alert and oriented to person, place, and time. No cranial nerve deficit.  Skin: Skin is warm and dry. He is not diaphoretic.  Psychiatric: He has a normal mood and affect. His behavior is normal. Judgment and thought content normal.       Assessment/Plan:   1. Hospital followup-the patient will be establishing at our clinic and is here for hospital followup. Please see problem oriented charting for full details.  2. Disposition-the patient will come back on Monday for INR check and visit with Dr. Alexandria Lodge. He was seen back by M.D. in one month. Advised him of our clinic and is working so that he could call if  you questions or problems and given our number. He was given lovenox shots to last until Monday at this visit.

## 2012-04-12 NOTE — Assessment & Plan Note (Addendum)
Patient is currently a nonsmoker however has smoked in the past. Advised him that she should quit smoking entirely and not take it up again.

## 2012-04-12 NOTE — Assessment & Plan Note (Addendum)
The patient did have recurrent PE with DVT during hospitalization. He was discharged on 8/19. His INR was 1.7 on day of discharge and he was discharged with instructions to take Coumadin 10 mg daily until followup INR check at today's visit. He was also instructed to take Lovenox twice a day until this followup visit. However there was poor to medication during discharge process and patient thought he was only supposed to be taking the Lovenox and not the Coumadin. He therefore has subtherapeutic INR of 1.2 at today's visit and will need re bridging as he is high risk for clot with this second PE. He was given physical supply of Lovenox shots 100 mg to be taken twice a day. He was instructed in their usage and safety. He was also has prescription for warfarin 5 mg tablets. He was given physical and pectoral representation of his dosing which is 10 mg daily of warfarin. He will be seen back on Monday by Dr. Alexandria Lodge for INR check. Dosing adjustment can be made at that time. He will take Lovenox injections until that visit. He'll be seen back by M.D. in one month. He'll likely needs lifelong anticoagulation. Patient states that he does have an uncle who had problems with blood clots.

## 2012-04-14 ENCOUNTER — Encounter: Payer: Self-pay | Admitting: Internal Medicine

## 2012-04-17 ENCOUNTER — Other Ambulatory Visit: Payer: Self-pay | Admitting: Pharmacist

## 2012-04-17 ENCOUNTER — Ambulatory Visit (INDEPENDENT_AMBULATORY_CARE_PROVIDER_SITE_OTHER): Payer: Self-pay | Admitting: Pharmacist

## 2012-04-17 ENCOUNTER — Encounter: Payer: Self-pay | Admitting: Internal Medicine

## 2012-04-17 DIAGNOSIS — I2699 Other pulmonary embolism without acute cor pulmonale: Secondary | ICD-10-CM

## 2012-04-17 DIAGNOSIS — Z7901 Long term (current) use of anticoagulants: Secondary | ICD-10-CM

## 2012-04-17 DIAGNOSIS — I80299 Phlebitis and thrombophlebitis of other deep vessels of unspecified lower extremity: Secondary | ICD-10-CM

## 2012-04-17 MED ORDER — WARFARIN SODIUM 5 MG PO TABS: ORAL_TABLET | ORAL | Status: DC

## 2012-04-17 NOTE — Progress Notes (Signed)
Recurrent PE.  Initial episode February 2012. Recurrent episode 2013 unprovoked and off of coumadin.

## 2012-04-17 NOTE — Progress Notes (Signed)
Anti-Coagulation Progress Note  Elias Dennington is a 36 y.o. male who is currently on an anti-coagulation regimen.    RECENT RESULTS: Recent results are below, the most recent result is correlated with a dose of 10 mg. Per day since last seen. Lab Results  Component Value Date   INR 2.20 04/17/2012   INR 1.2 04/12/2012   INR 1.76* 04/10/2012   PROTIME 20.4* 03/16/2011    ANTI-COAG DOSE:   Latest dosing instructions   Total [name]   70 10 mg 10 mg 10 mg 10 mg 10 mg 10 mg 10 mg    (5 mg2) (5 mg2) (5 mg2) (5 mg2) (5 mg2) (5 mg2) (5 mg2)         ANTICOAG SUMMARY: Anticoagulation Episode Summary              Current INR goal 2.0-3.0 Next INR check 05/01/2012   INR from last check 2.20 (04/17/2012)     Weekly max dose (mg)  Target end date Indefinite   Indications Pulmonary embolism and infarction, D V T, Encounter for long-term (current) use of anticoagulants   [facility] check location [facility]    Send INR reminders to    Comments             ANTICOAG TODAY: Anticoagulation Summary as of 04/17/2012              INR goal 2.0-3.0     Selected INR 2.20 (04/17/2012) Next INR check 05/01/2012   Weekly max dose (mg)  Target end date Indefinite   Indications Pulmonary embolism and infarction, D V T, Encounter for long-term (current) use of anticoagulants    Anticoagulation Episode Summary              INR check location [facility]    Send INR reminders to    Comments             PATIENT INSTRUCTIONS: Patient Instructions  Patient instructed to take medications as defined in the Anti-coagulation Track section of this encounter.  Patient instructed to take today's dose.  Patient verbalized understanding of these instructions.        FOLLOW-UP Return in 2 weeks (on 05/01/2012) for Follow up INR at 1030h.  [name], III Pharm.D., CACP

## 2012-04-17 NOTE — Patient Instructions (Signed)
Patient instructed to take medications as defined in the Anti-coagulation Track section of this encounter.  Patient instructed to take today's dose.  Patient verbalized understanding of these instructions.    

## 2012-04-20 NOTE — Discharge Summary (Addendum)
Physician Discharge Summary  Patient ID: Richard [name] MRN: [id number] DOB/AGE: 1975/10/24 36 y.o.  Admit date: 04/05/2012 Discharge date: 04/10/2012  Primary Care Physician:  Vernice Jefferson, MD   Discharge Diagnoses:    Principal Problem:  * Recurrent Pulmonary embolism and infarction Active Problems:  Tobacco abuse    Medication List  As of 04/20/2012 10:02 AM   STOP taking these medications         warfarin 5 MG tablet        Lovenox 1mg /kg BID today and 8/20 and 8/21 if INR <2     Disposition and Follow-up:  PCP 8/23 at IM clinic INR check 8/21  Significant Diagnostic Studies:  CTA chest  IMPRESSION: Bilateral segmental pulmonary emboli, right greater than left. I telephoned the critical test results to Dr. Manus Gunning at the time of interpretation.   Brief H and P: HPI: 36 year old male with known history of PE who has been on Coumadin off and on for last one year presented with complaints of chest pain. Patient states over the last 2 weeks he's been off Coumadin. Patient has no PCP and gets his Coumadin filled during ER visits. Chest pain started off yesterday morning left-sided anterior chest wall. The pain has no relation to exertion or inspiration. Since the pain is persistent he came to the ER. CT angiogram of the chest shows bilateral PE and has been admitted for further management. Patient at this time is hemodynamically stable. Patient has been already started on IV heparin. Patient otherwise denies any palpitations dizziness shortness of breath nausea vomiting or abdominal pain.     Hospital Course:  #1. Pulmonary embolism -  Recurrent after stopping coumadin, unprovoked Continue SQ lovenox today, completed day 5 of bridge with coumadin  Set up to FU with [facility] clinic at [facility] Continue lovenox today and 8/20 and INR check at clinic 8/21 to determine if INR therapeutic in which case he can stop lovenox. Ideally continue anticoagulation lifelong FU at IM clinic  with new PCP 8/23 #2. Tobacco abuse - strongly advised to quit smoking     Time spent on Discharge:  Signed: Bess Saltzman [facility]  04/20/2012, 10:02 AM

## 2012-05-01 ENCOUNTER — Ambulatory Visit (INDEPENDENT_AMBULATORY_CARE_PROVIDER_SITE_OTHER): Payer: Self-pay | Admitting: Pharmacist

## 2012-05-01 DIAGNOSIS — I80299 Phlebitis and thrombophlebitis of other deep vessels of unspecified lower extremity: Secondary | ICD-10-CM

## 2012-05-01 DIAGNOSIS — Z7901 Long term (current) use of anticoagulants: Secondary | ICD-10-CM

## 2012-05-01 DIAGNOSIS — I2699 Other pulmonary embolism without acute cor pulmonale: Secondary | ICD-10-CM

## 2012-05-01 LAB — POCT INR: INR: 5.2

## 2012-05-01 NOTE — Patient Instructions (Signed)
Patient instructed to take medications as defined in the Anti-coagulation Track section of this encounter.  Patient instructed to OMIT/HOLD today's dose.  Patient verbalized understanding of these instructions.    

## 2012-05-01 NOTE — Progress Notes (Signed)
Anti-Coagulation Progress Note  Richard [name] is a 36 y.o. male who is currently on an anti-coagulation regimen.    RECENT RESULTS: Recent results are below, the most recent result is correlated with a dose of 70 mg. per week: Lab Results  Component Value Date   INR 5.20 05/01/2012   INR 2.20 04/17/2012   INR 1.2 04/12/2012   PROTIME 20.4* 03/16/2011    ANTI-COAG DOSE:   Latest dosing instructions   Total [name]   62.5 7.5 mg 10 mg 10 mg 7.5 mg 10 mg 7.5 mg 10 mg    (5 mg1.5) (5 mg2) (5 mg2) (5 mg1.5) (5 mg2) (5 mg1.5) (5 mg2)         ANTICOAG SUMMARY: Anticoagulation Episode Summary              Current INR goal 2.0-3.0 Next INR check 05/15/2012   INR from last check 5.20! (05/01/2012)     Weekly max dose (mg)  Target end date Indefinite   Indications Pulmonary embolism and infarction, D V T, Encounter for long-term (current) use of anticoagulants   [facility] check location [facility]    Send INR reminders to    Comments             ANTICOAG TODAY: Anticoagulation Summary as of 05/01/2012              INR goal 2.0-3.0     Selected INR 5.20! (05/01/2012) Next INR check 05/15/2012   Weekly max dose (mg)  Target end date Indefinite   Indications Pulmonary embolism and infarction, D V T, Encounter for long-term (current) use of anticoagulants    Anticoagulation Episode Summary              INR check location [facility]    Send INR reminders to    Comments             PATIENT INSTRUCTIONS: Patient Instructions  Patient instructed to take medications as defined in the Anti-coagulation Track section of this encounter.  Patient instructed to OMIT/HOLD today's dose.  Patient verbalized understanding of these instructions.        FOLLOW-UP Return in 2 weeks (on 05/15/2012) for Follow up INR at 1145h.  [name], III Pharm.D., CACP

## 2012-05-15 ENCOUNTER — Ambulatory Visit (INDEPENDENT_AMBULATORY_CARE_PROVIDER_SITE_OTHER): Payer: Self-pay | Admitting: Pharmacist

## 2012-05-15 DIAGNOSIS — I2699 Other pulmonary embolism without acute cor pulmonale: Secondary | ICD-10-CM

## 2012-05-15 DIAGNOSIS — I80299 Phlebitis and thrombophlebitis of other deep vessels of unspecified lower extremity: Secondary | ICD-10-CM

## 2012-05-15 DIAGNOSIS — Z7901 Long term (current) use of anticoagulants: Secondary | ICD-10-CM

## 2012-05-15 LAB — POCT INR: INR: 3.4

## 2012-05-15 NOTE — Progress Notes (Signed)
Agree with plan 

## 2012-05-15 NOTE — Patient Instructions (Signed)
Patient instructed to take medications as defined in the Anti-coagulation Track section of this encounter.  Patient instructed to take today's dose.  Patient verbalized understanding of these instructions.    

## 2012-05-15 NOTE — Progress Notes (Signed)
Anti-Coagulation Progress Note  Richard [name] is a 36 y.o. male who is currently on an anti-coagulation regimen.    RECENT RESULTS: Recent results are below, the most recent result is correlated with a dose of 62.5 mg. per week: Lab Results  Component Value Date   INR 3.40 05/15/2012   INR 5.20 05/01/2012   INR 2.20 04/17/2012   PROTIME 20.4* 03/16/2011    ANTI-COAG DOSE:   Latest dosing instructions   Total [name]   60 7.5 mg 7.5 mg 10 mg 7.5 mg 10 mg 7.5 mg 10 mg    (5 mg1.5) (5 mg1.5) (5 mg2) (5 mg1.5) (5 mg2) (5 mg1.5) (5 mg2)         ANTICOAG SUMMARY: Anticoagulation Episode Summary              Current INR goal 2.0-3.0 Next INR check 05/29/2012   INR from last check 3.40! (05/15/2012)     Weekly max dose (mg)  Target end date Indefinite   Indications Pulmonary embolism and infarction, D V T, Encounter for long-term (current) use of anticoagulants   [facility] check location [facility]    Send INR reminders to    Comments             ANTICOAG TODAY: Anticoagulation Summary as of 05/15/2012              INR goal 2.0-3.0     Selected INR 3.40! (05/15/2012) Next INR check 05/29/2012   Weekly max dose (mg)  Target end date Indefinite   Indications Pulmonary embolism and infarction, D V T, Encounter for long-term (current) use of anticoagulants    Anticoagulation Episode Summary              INR check location [facility]    Send INR reminders to    Comments             PATIENT INSTRUCTIONS: Patient Instructions  Patient instructed to take medications as defined in the Anti-coagulation Track section of this encounter.  Patient instructed to take today's dose.  Patient verbalized understanding of these instructions.        FOLLOW-UP Return in 2 weeks (on 05/29/2012) for Follow up INR at 1130h.  [name], III Pharm.D., CACP

## 2012-05-29 ENCOUNTER — Ambulatory Visit (INDEPENDENT_AMBULATORY_CARE_PROVIDER_SITE_OTHER): Payer: Self-pay | Admitting: Pharmacist

## 2012-05-29 DIAGNOSIS — I80299 Phlebitis and thrombophlebitis of other deep vessels of unspecified lower extremity: Secondary | ICD-10-CM

## 2012-05-29 DIAGNOSIS — I82409 Acute embolism and thrombosis of unspecified deep veins of unspecified lower extremity: Secondary | ICD-10-CM

## 2012-05-29 DIAGNOSIS — Z7901 Long term (current) use of anticoagulants: Secondary | ICD-10-CM

## 2012-05-29 DIAGNOSIS — I2699 Other pulmonary embolism without acute cor pulmonale: Secondary | ICD-10-CM

## 2012-05-29 LAB — POCT INR: INR: 3.1

## 2012-05-29 MED ORDER — WARFARIN SODIUM 5 MG PO TABS: ORAL_TABLET | ORAL | Status: DC

## 2012-05-29 NOTE — Patient Instructions (Signed)
Patient instructed to take medications as defined in the Anti-coagulation Track section of this encounter.  Patient instructed to take today's dose.  Patient verbalized understanding of these instructions.    

## 2012-05-29 NOTE — Progress Notes (Signed)
Agree with plan 

## 2012-05-29 NOTE — Progress Notes (Signed)
Anti-Coagulation Progress Note  Richard [name] is a 36 y.o. male who is currently on an anti-coagulation regimen.    RECENT RESULTS: Recent results are below, the most recent result is correlated with a dose of 60 mg. per week: Lab Results  Component Value Date   INR 3.10 05/29/2012   INR 3.40 05/15/2012   INR 5.20 05/01/2012   PROTIME 20.4* 03/16/2011    ANTI-COAG DOSE:   Latest dosing instructions   Total [name]   57.5 7.5 mg 7.5 mg 10 mg 7.5 mg 7.5 mg 10 mg 7.5 mg    (5 mg1.5) (5 mg1.5) (5 mg2) (5 mg1.5) (5 mg1.5) (5 mg2) (5 mg1.5)         ANTICOAG SUMMARY: Anticoagulation Episode Summary              Current INR goal 2.0-3.0 Next INR check 06/19/2012   INR from last check 3.10! (05/29/2012)     Weekly max dose (mg)  Target end date Indefinite   Indications Pulmonary embolism and infarction, D V T, Encounter for long-term (current) use of anticoagulants   [facility] check location [facility]    Send INR reminders to    Comments             ANTICOAG TODAY: Anticoagulation Summary as of 05/29/2012              INR goal 2.0-3.0     Selected INR 3.10! (05/29/2012) Next INR check 06/19/2012   Weekly max dose (mg)  Target end date Indefinite   Indications Pulmonary embolism and infarction, D V T, Encounter for long-term (current) use of anticoagulants    Anticoagulation Episode Summary              INR check location [facility]    Send INR reminders to    Comments             PATIENT INSTRUCTIONS: Patient Instructions  Patient instructed to take medications as defined in the Anti-coagulation Track section of this encounter.  Patient instructed to take today's dose.  Patient verbalized understanding of these instructions.        FOLLOW-UP Return in 3 weeks (on 06/19/2012) for Follow  up INR at 0930h.  [name], III Pharm.D., CACP

## 2012-06-19 ENCOUNTER — Ambulatory Visit (INDEPENDENT_AMBULATORY_CARE_PROVIDER_SITE_OTHER): Payer: Self-pay | Admitting: Pharmacist

## 2012-06-19 DIAGNOSIS — I80299 Phlebitis and thrombophlebitis of other deep vessels of unspecified lower extremity: Secondary | ICD-10-CM

## 2012-06-19 DIAGNOSIS — I2699 Other pulmonary embolism without acute cor pulmonale: Secondary | ICD-10-CM

## 2012-06-19 DIAGNOSIS — Z7901 Long term (current) use of anticoagulants: Secondary | ICD-10-CM

## 2012-06-19 LAB — POCT INR: INR: 2

## 2012-06-19 MED ORDER — WARFARIN SODIUM 5 MG PO TABS: ORAL_TABLET | ORAL | Status: DC

## 2012-06-19 NOTE — Patient Instructions (Signed)
Patient instructed to take medications as defined in the Anti-coagulation Track section of this encounter.  Patient instructed to take today's dose.  Patient verbalized understanding of these instructions.    

## 2012-06-19 NOTE — Progress Notes (Signed)
Anti-Coagulation Progress Note  Hawkins Duyck is a 36 y.o. male who is currently on an anti-coagulation regimen.    RECENT RESULTS: Recent results are below, the most recent result is correlated with a dose of 57.5 mg. per week: Lab Results  Component Value Date   INR 2.00 06/19/2012   INR 3.10 05/29/2012   INR 3.40 05/15/2012   PROTIME 20.4* 03/16/2011    ANTI-COAG DOSE:   Latest dosing instructions   Total [name]   60 7.5 mg 10 mg 7.5 mg 10 mg 7.5 mg 10 mg 7.5 mg    (5 mg1.5) (5 mg2) (5 mg1.5) (5 mg2) (5 mg1.5) (5 mg2) (5 mg1.5)         ANTICOAG SUMMARY: Anticoagulation Episode Summary              Current INR goal 2.0-3.0 Next INR check 07/17/2012   INR from last check 2.00 (06/19/2012)     Weekly max dose (mg)  Target end date Indefinite   Indications Pulmonary embolism and infarction, D V T, Encounter for long-term (current) use of anticoagulants   INR check location [facility]    Send INR reminders to    Comments             ANTICOAG TODAY: Anticoagulation Summary as of 06/19/2012              INR goal 2.0-3.0     Selected INR 2.00 (06/19/2012) Next INR check 07/17/2012   Weekly max dose (mg)  Target end date Indefinite   Indications Pulmonary embolism and infarction, D V T, Encounter for long-term (current) use of anticoagulants    Anticoagulation Episode Summary              INR check location [facility]    Send INR reminders to    Comments             PATIENT INSTRUCTIONS: Patient Instructions  Patient instructed to take medications as defined in the Anti-coagulation Track section of this encounter.  Patient instructed to take today's dose.  Patient verbalized understanding of these instructions.        FOLLOW-UP Return in 4 weeks (on 07/17/2012) for Follow up INR at 0930h.  [name], III Pharm.D., CACP

## 2012-07-17 ENCOUNTER — Ambulatory Visit (INDEPENDENT_AMBULATORY_CARE_PROVIDER_SITE_OTHER): Payer: Self-pay | Admitting: Pharmacist

## 2012-07-17 DIAGNOSIS — I80299 Phlebitis and thrombophlebitis of other deep vessels of unspecified lower extremity: Secondary | ICD-10-CM

## 2012-07-17 DIAGNOSIS — Z7901 Long term (current) use of anticoagulants: Secondary | ICD-10-CM

## 2012-07-17 DIAGNOSIS — I2699 Other pulmonary embolism without acute cor pulmonale: Secondary | ICD-10-CM

## 2012-07-17 LAB — POCT INR: INR: 4.2

## 2012-07-17 MED ORDER — WARFARIN SODIUM 5 MG PO TABS: ORAL_TABLET | ORAL | Status: DC

## 2012-07-17 NOTE — Patient Instructions (Signed)
Patient instructed to take medications as defined in the Anti-coagulation Track section of this encounter.  Patient instructed to OMIT/HOLD today's dose.  Patient verbalized understanding of these instructions.    

## 2012-07-17 NOTE — Progress Notes (Signed)
Anti-Coagulation Progress Note  Richard [name] is a 36 y.o. male who is currently on an anti-coagulation regimen.    RECENT RESULTS: Recent results are below, the most recent result is correlated with a dose of 60 mg. per week: Lab Results  Component Value Date   INR 4.20 07/17/2012   INR 2.00 06/19/2012   INR 3.10 05/29/2012   PROTIME 20.4* 03/16/2011    ANTI-COAG DOSE:   Latest dosing instructions   Total [name]   60 7.5 mg 10 mg 7.5 mg 10 mg 7.5 mg 10 mg 7.5 mg    (5 mg1.5) (5 mg2) (5 mg1.5) (5 mg2) (5 mg1.5) (5 mg2) (5 mg1.5)         ANTICOAG SUMMARY: Anticoagulation Episode Summary              Current INR goal 2.0-3.0 Next INR check 08/07/2012   INR from last check 4.20! (07/17/2012)     Weekly max dose (mg)  Target end date Indefinite   Indications Pulmonary embolism and infarction [415.19], D V T [451.19], Encounter for long-term (current) use of anticoagulants [V58.61]   INR check location [facility]    Send INR reminders to    Comments             ANTICOAG TODAY: Anticoagulation Summary as of 07/17/2012              INR goal 2.0-3.0     Selected INR 4.20! (07/17/2012) Next INR check 08/07/2012   Weekly max dose (mg)  Target end date Indefinite   Indications Pulmonary embolism and infarction [415.19], D V T [451.19], Encounter for long-term (current) use of anticoagulants [V58.61]    Anticoagulation Episode Summary              INR check location [facility]    Send INR reminders to    Comments             PATIENT INSTRUCTIONS: Patient Instructions  Patient instructed to take medications as defined in the Anti-coagulation Track section of this encounter.  Patient instructed to OMIT/HOLD today's dose.  Patient verbalized understanding of these instructions.        FOLLOW-UP Return in 3 weeks (on 08/07/2012) for Follow up INR at 0915h.  [name], III Pharm.D., CACP

## 2012-08-07 ENCOUNTER — Ambulatory Visit (INDEPENDENT_AMBULATORY_CARE_PROVIDER_SITE_OTHER): Payer: Self-pay | Admitting: Pharmacist

## 2012-08-07 DIAGNOSIS — I80299 Phlebitis and thrombophlebitis of other deep vessels of unspecified lower extremity: Secondary | ICD-10-CM

## 2012-08-07 DIAGNOSIS — Z7901 Long term (current) use of anticoagulants: Secondary | ICD-10-CM

## 2012-08-07 DIAGNOSIS — I2699 Other pulmonary embolism without acute cor pulmonale: Secondary | ICD-10-CM

## 2012-08-07 MED ORDER — WARFARIN SODIUM 5 MG PO TABS: ORAL_TABLET | ORAL | Status: DC

## 2012-08-07 NOTE — Progress Notes (Signed)
Anti-Coagulation Progress Note  Richard [name] is a 36 y.o. male who is currently on an anti-coagulation regimen.    RECENT RESULTS: Recent results are below, the most recent result is correlated with a dose of 60 mg. per week: Lab Results  Component Value Date   INR 2.50 08/07/2012   INR 4.20 07/17/2012   INR 2.00 06/19/2012   PROTIME 20.4* 03/16/2011    ANTI-COAG DOSE:   Latest dosing instructions   Total [name]   60 7.5 mg 10 mg 7.5 mg 10 mg 7.5 mg 10 mg 7.5 mg    (5 mg1.5) (5 mg2) (5 mg1.5) (5 mg2) (5 mg1.5) (5 mg2) (5 mg1.5)         ANTICOAG SUMMARY: Anticoagulation Episode Summary              Current INR goal 2.0-3.0 Next INR check 09/04/2012   INR from last check 2.50 (08/07/2012)     Weekly max dose (mg)  Target end date Indefinite   Indications Pulmonary embolism and infarction [415.19], D V T [451.19], Encounter for long-term (current) use of anticoagulants [V58.61]   INR check location [facility]    Send INR reminders to    Comments             ANTICOAG TODAY: Anticoagulation Summary as of 08/07/2012              INR goal 2.0-3.0     Selected INR 2.50 (08/07/2012) Next INR check 09/04/2012   Weekly max dose (mg)  Target end date Indefinite   Indications Pulmonary embolism and infarction [415.19], D V T [451.19], Encounter for long-term (current) use of anticoagulants [V58.61]    Anticoagulation Episode Summary              INR check location [facility]    Send INR reminders to    Comments             PATIENT INSTRUCTIONS: Patient Instructions  Patient instructed to take medications as defined in the Anti-coagulation Track section of this encounter.  Patient instructed to take today's dose.  Patient verbalized understanding of these instructions.        FOLLOW-UP Return in 4 weeks (on 09/04/2012) for Follow up INR at 0930h.  [name], III Pharm.D., CACP

## 2012-08-07 NOTE — Patient Instructions (Signed)
Patient instructed to take medications as defined in the Anti-coagulation Track section of this encounter.  Patient instructed to take today's dose.  Patient verbalized understanding of these instructions.    

## 2012-09-04 ENCOUNTER — Ambulatory Visit (INDEPENDENT_AMBULATORY_CARE_PROVIDER_SITE_OTHER): Payer: Self-pay | Admitting: Pharmacist

## 2012-09-04 DIAGNOSIS — I80299 Phlebitis and thrombophlebitis of other deep vessels of unspecified lower extremity: Secondary | ICD-10-CM

## 2012-09-04 DIAGNOSIS — Z7901 Long term (current) use of anticoagulants: Secondary | ICD-10-CM

## 2012-09-04 DIAGNOSIS — I2699 Other pulmonary embolism without acute cor pulmonale: Secondary | ICD-10-CM

## 2012-09-04 NOTE — Patient Instructions (Signed)
Patient instructed to take medications as defined in the Anti-coagulation Track section of this encounter.  Patient instructed to take today's dose.  Patient verbalized understanding of these instructions.    

## 2012-09-04 NOTE — Progress Notes (Signed)
Anti-Coagulation Progress Note  Richard [name] is a 37 y.o. male who is currently on an anti-coagulation regimen.    RECENT RESULTS: Recent results are below, the most recent result is correlated with a dose of 60 mg. per week: Lab Results  Component Value Date   INR 2.40 09/04/2012   INR 2.50 08/07/2012   INR 4.20 07/17/2012   PROTIME 20.4* 03/16/2011    ANTI-COAG DOSE:   Latest dosing instructions   Total [name]   60 7.5 mg 10 mg 7.5 mg 10 mg 7.5 mg 10 mg 7.5 mg    (5 mg1.5) (5 mg2) (5 mg1.5) (5 mg2) (5 mg1.5) (5 mg2) (5 mg1.5)         ANTICOAG SUMMARY: Anticoagulation Episode Summary              Current INR goal 2.0-3.0 Next INR check 10/02/2012   INR from last check 2.40 (09/04/2012)     Weekly max dose (mg)  Target end date Indefinite   Indications Pulmonary embolism and infarction [415.19], D V T [451.19], Encounter for long-term (current) use of anticoagulants [V58.61]   INR check location [facility]    Send INR reminders to    Comments             ANTICOAG TODAY: Anticoagulation Summary as of 09/04/2012              INR goal 2.0-3.0     Selected INR 2.40 (09/04/2012) Next INR check 10/02/2012   Weekly max dose (mg)  Target end date Indefinite   Indications Pulmonary embolism and infarction [415.19], D V T [451.19], Encounter for long-term (current) use of anticoagulants [V58.61]    Anticoagulation Episode Summary              INR check location [facility]    Send INR reminders to    Comments             PATIENT INSTRUCTIONS: Patient Instructions  Patient instructed to take medications as defined in the Anti-coagulation Track section of this encounter.  Patient instructed to take today's dose.  Patient verbalized understanding of these instructions.        FOLLOW-UP Return in 4 weeks (on 10/02/2012) for Follow up INR at 1115h.  [name], III Pharm.D., CACP

## 2012-10-02 ENCOUNTER — Ambulatory Visit (INDEPENDENT_AMBULATORY_CARE_PROVIDER_SITE_OTHER): Payer: Self-pay | Admitting: Pharmacist

## 2012-10-02 DIAGNOSIS — Z7901 Long term (current) use of anticoagulants: Secondary | ICD-10-CM

## 2012-10-02 DIAGNOSIS — I80299 Phlebitis and thrombophlebitis of other deep vessels of unspecified lower extremity: Secondary | ICD-10-CM

## 2012-10-02 DIAGNOSIS — I2699 Other pulmonary embolism without acute cor pulmonale: Secondary | ICD-10-CM

## 2012-10-02 MED ORDER — WARFARIN SODIUM 5 MG PO TABS: ORAL_TABLET | ORAL | Status: DC

## 2012-10-02 NOTE — Addendum Note (Signed)
Addended by: [name] B on: 10/02/2012 12:51 PM   Modules accepted: Orders

## 2012-10-02 NOTE — Progress Notes (Signed)
Anti-Coagulation Progress Note  Richard [name] is a 37 y.o. male who is currently on an anti-coagulation regimen.    RECENT RESULTS: Recent results are below, the most recent result is correlated with a dose of 60 mg. per week: Lab Results  Component Value Date   INR 1.30 10/02/2012   INR 2.40 09/04/2012   INR 2.50 08/07/2012   PROTIME 20.4* 03/16/2011    ANTI-COAG DOSE: Anticoagulation Dose Instructions as of 10/02/2012     [name] Tue Wed Thu Fri Sat   New Dose 7.5 mg 10 mg 10 mg 10 mg 10 mg 10 mg 7.5 mg       ANTICOAG SUMMARY: Anticoagulation Episode Summary   Current INR goal 2.0-3.0  Next INR check 10/16/2012  INR from last check 1.30! (10/02/2012)  Weekly max dose   Target end date Indefinite  [facility] check location [facility]  Preferred lab   Send INR reminders to    Indications  Pulmonary embolism and infarction [415.19] D V T [451.19] Encounter for long-term (current) use of anticoagulants [V58.61]        Comments         ANTICOAG TODAY: Anticoagulation Summary as of 10/02/2012   INR goal 2.0-3.0  Selected INR 1.30! (10/02/2012)  Next INR check 10/16/2012  Target end date Indefinite   Indications  Pulmonary embolism and infarction [415.19] D V T [451.19] Encounter for long-term (current) use of anticoagulants [V58.61]      Anticoagulation Episode Summary   [facility] check [facility]   Preferred lab    Send INR reminders to    Comments       PATIENT INSTRUCTIONS: Patient Instructions  Patient instructed to take medications as defined in the Anti-coagulation Track section of this encounter.  Patient instructed to take today's dose.  Patient verbalized understanding of these instructions.       FOLLOW-UP Return in 2 weeks (on 10/16/2012) for Follow up INR at 0930h.  [name], III Pharm.D., CACP

## 2012-10-02 NOTE — Progress Notes (Signed)
Agree 

## 2012-10-02 NOTE — Patient Instructions (Signed)
Patient instructed to take medications as defined in the Anti-coagulation Track section of this encounter.  Patient instructed to take today's dose.  Patient verbalized understanding of these instructions.    

## 2012-10-16 ENCOUNTER — Ambulatory Visit (INDEPENDENT_AMBULATORY_CARE_PROVIDER_SITE_OTHER): Payer: Self-pay | Admitting: Pharmacist

## 2012-10-16 DIAGNOSIS — Z7901 Long term (current) use of anticoagulants: Secondary | ICD-10-CM

## 2012-10-16 DIAGNOSIS — I80299 Phlebitis and thrombophlebitis of other deep vessels of unspecified lower extremity: Secondary | ICD-10-CM

## 2012-10-16 DIAGNOSIS — I2699 Other pulmonary embolism without acute cor pulmonale: Secondary | ICD-10-CM

## 2012-10-16 LAB — POCT INR: INR: 2

## 2012-10-16 MED ORDER — WARFARIN SODIUM 5 MG PO TABS: 10.0000 mg | ORAL_TABLET | Freq: Every day | ORAL | Status: DC

## 2012-10-16 NOTE — Progress Notes (Signed)
Anti-Coagulation Progress Note  Richard [name] is a 37 y.o. male who is currently on an anti-coagulation regimen.    RECENT RESULTS: Recent results are below, the most recent result is correlated with a dose of 65 mg. per week: Lab Results  Component Value Date   INR 2.00 10/16/2012   INR 1.30 10/02/2012   INR 2.40 09/04/2012   PROTIME 20.4* 03/16/2011    ANTI-COAG DOSE: Anticoagulation Dose Instructions as of 10/16/2012     [name] Tue Wed Thu Fri Sat   New Dose 10 mg 10 mg 10 mg 10 mg 10 mg 10 mg 10 mg       ANTICOAG SUMMARY: Anticoagulation Episode Summary   Current INR goal 2.0-3.0  Next INR check 11/06/2012  INR from last check 2.00 (10/16/2012)  Weekly max dose   Target end date Indefinite  [facility]  Preferred lab   Send INR reminders to    Indications  Pulmonary embolism and infarction [415.19] D V T [451.19] Encounter for long-term (current) use of anticoagulants [V58.61]        Comments         ANTICOAG TODAY: Anticoagulation Summary as of 10/16/2012   INR goal 2.0-3.0  Selected INR 2.00 (10/16/2012)  Next INR check 11/06/2012  Target end date Indefinite   Indications  Pulmonary embolism and infarction [415.19] D V T [451.19] Encounter for long-term (current) use of anticoagulants [V58.61]      Anticoagulation Episode Summary   [facility] check [facility]   Preferred lab    Send INR reminders to    Comments       PATIENT INSTRUCTIONS: Patient Instructions  Patient instructed to take medications as defined in the Anti-coagulation Track section of this encounter.  Patient instructed to take today's dose.  Patient verbalized understanding of these instructions.       FOLLOW-UP Return in 3 weeks (on 11/06/2012) for Follow up INR at 1000h.  [name], III Pharm.D., CACP

## 2012-10-16 NOTE — Patient Instructions (Addendum)
Patient instructed to take medications as defined in the Anti-coagulation Track section of this encounter.  Patient instructed to take today's dose.  Patient verbalized understanding of these instructions.    

## 2012-10-17 ENCOUNTER — Ambulatory Visit: Payer: Self-pay

## 2012-11-06 ENCOUNTER — Ambulatory Visit: Payer: Self-pay

## 2012-11-06 ENCOUNTER — Ambulatory Visit (INDEPENDENT_AMBULATORY_CARE_PROVIDER_SITE_OTHER): Payer: No Typology Code available for payment source | Admitting: Pharmacist

## 2012-11-06 DIAGNOSIS — I80299 Phlebitis and thrombophlebitis of other deep vessels of unspecified lower extremity: Secondary | ICD-10-CM

## 2012-11-06 DIAGNOSIS — I2699 Other pulmonary embolism without acute cor pulmonale: Secondary | ICD-10-CM

## 2012-11-06 DIAGNOSIS — Z7901 Long term (current) use of anticoagulants: Secondary | ICD-10-CM

## 2012-11-06 LAB — POCT INR: INR: 3.1

## 2012-11-06 NOTE — Progress Notes (Signed)
Anti-Coagulation Progress Note  Richard [name] is a 37 y.o. male who is currently on an anti-coagulation regimen.    RECENT RESULTS: Recent results are below, the most recent result is correlated with a dose of 70 mg. per week: Lab Results  Component Value Date   INR 3.10 11/06/2012   INR 2.00 10/16/2012   INR 1.30 10/02/2012   PROTIME 20.4* 03/16/2011    ANTI-COAG DOSE: Anticoagulation Dose Instructions as of 11/06/2012     [name] Tue Wed Thu Fri Sat   New Dose 10 mg 10 mg 7.5 mg 10 mg 10 mg 10 mg 10 mg       ANTICOAG SUMMARY: Anticoagulation Episode Summary   Current INR goal 2.0-3.0  Next INR check 12/04/2012  INR from last check 3.10! (11/06/2012)  Weekly max dose   Target end date Indefinite  [facility]  Preferred lab   Send INR reminders to    Indications  Pulmonary embolism and infarction [415.19] D V T [451.19] Encounter for long-term (current) use of anticoagulants [V58.61]        Comments         ANTICOAG TODAY: Anticoagulation Summary as of 11/06/2012   INR goal 2.0-3.0  Selected INR 3.10! (11/06/2012)  Next INR check 12/04/2012  Target end date Indefinite   Indications  Pulmonary embolism and infarction [415.19] D V T [451.19] Encounter for long-term (current) use of anticoagulants [V58.61]      Anticoagulation Episode Summary   [facility] check [facility]   Preferred lab    Send INR reminders to    Comments       PATIENT INSTRUCTIONS: Patient Instructions  Patient instructed to take medications as defined in the Anti-coagulation Track section of this encounter.  Patient instructed to take today's dose.  Patient verbalized understanding of these instructions.       FOLLOW-UP Return in 4 weeks (on 12/04/2012) for Follow up INR at 0930h.  [name], III Pharm.D., CACP

## 2012-11-06 NOTE — Patient Instructions (Signed)
Patient instructed to take medications as defined in the Anti-coagulation Track section of this encounter.  Patient instructed to take today's dose.  Patient verbalized understanding of these instructions.    

## 2012-12-04 ENCOUNTER — Ambulatory Visit (INDEPENDENT_AMBULATORY_CARE_PROVIDER_SITE_OTHER): Payer: No Typology Code available for payment source | Admitting: Pharmacist

## 2012-12-04 DIAGNOSIS — I2699 Other pulmonary embolism without acute cor pulmonale: Secondary | ICD-10-CM

## 2012-12-04 DIAGNOSIS — Z7901 Long term (current) use of anticoagulants: Secondary | ICD-10-CM

## 2012-12-04 DIAGNOSIS — I80299 Phlebitis and thrombophlebitis of other deep vessels of unspecified lower extremity: Secondary | ICD-10-CM

## 2012-12-04 MED ORDER — WARFARIN SODIUM 5 MG PO TABS: 10.0000 mg | ORAL_TABLET | Freq: Every day | ORAL | Status: DC

## 2012-12-04 NOTE — Patient Instructions (Signed)
Patient instructed to take medications as defined in the Anti-coagulation Track section of this encounter.  Patient instructed to take today's dose.  Patient verbalized understanding of these instructions.    

## 2012-12-04 NOTE — Progress Notes (Signed)
Indication: Recurrent thromboembolism.  Duration: Lifelong.  INR above target.  Agree with Dr. Saralyn Pilar assessment and plan as documented.

## 2012-12-04 NOTE — Progress Notes (Signed)
Anti-Coagulation Progress Note  Richard [name] is a 37 y.o. male who is currently on an anti-coagulation regimen.    RECENT RESULTS: Recent results are below, the most recent result is correlated with a dose of 67.5 mg. per week: Lab Results  Component Value Date   INR 4.10 12/04/2012   INR 3.10 11/06/2012   INR 2.00 10/16/2012   PROTIME 20.4* 03/16/2011    ANTI-COAG DOSE: Anticoagulation Dose Instructions as of 12/04/2012     [name] Tue Wed Thu Fri Sat   New Dose 10 mg 10 mg 7.5 mg 10 mg 7.5 mg 10 mg 7.5 mg       ANTICOAG SUMMARY: Anticoagulation Episode Summary   Current INR goal 2.0-3.0  Next INR check 12/18/2012  INR from last check 4.10! (12/04/2012)  Weekly max dose   Target end date Indefinite  [facility] check location [facility]  Preferred lab   Send INR reminders to    Indications  Pulmonary embolism and infarction [415.19] D V T [451.19] Encounter for long-term (current) use of anticoagulants [V58.61]        Comments         ANTICOAG TODAY: Anticoagulation Summary as of 12/04/2012   INR goal 2.0-3.0  Selected INR 4.10! (12/04/2012)  Next INR check 12/18/2012  Target end date Indefinite   Indications  Pulmonary embolism and infarction [415.19] D V T [451.19] Encounter for long-term (current) use of anticoagulants [V58.61]      Anticoagulation Episode Summary   [facility] check location [facility]   Preferred lab    Send INR reminders to    Comments       PATIENT INSTRUCTIONS: Patient Instructions  Patient instructed to take medications as defined in the Anti-coagulation Track section of this encounter.  Patient instructed to take today's dose.  Patient verbalized understanding of these instructions.       FOLLOW-UP Return in 2 weeks (on 12/18/2012) for Follow up INR at 1000h.  [name], III Pharm.D., CACP

## 2012-12-18 ENCOUNTER — Ambulatory Visit (INDEPENDENT_AMBULATORY_CARE_PROVIDER_SITE_OTHER): Payer: No Typology Code available for payment source | Admitting: Pharmacist

## 2012-12-18 DIAGNOSIS — Z7901 Long term (current) use of anticoagulants: Secondary | ICD-10-CM

## 2012-12-18 DIAGNOSIS — I80299 Phlebitis and thrombophlebitis of other deep vessels of unspecified lower extremity: Secondary | ICD-10-CM

## 2012-12-18 DIAGNOSIS — I2699 Other pulmonary embolism without acute cor pulmonale: Secondary | ICD-10-CM

## 2012-12-18 LAB — POCT INR: INR: 2.6

## 2012-12-18 MED ORDER — WARFARIN SODIUM 5 MG PO TABS: 10.0000 mg | ORAL_TABLET | Freq: Every day | ORAL | Status: DC

## 2012-12-18 NOTE — Patient Instructions (Signed)
Patient instructed to take medications as defined in the Anti-coagulation Track section of this encounter.  Patient instructed to take today's dose.  Patient verbalized understanding of these instructions.    

## 2012-12-18 NOTE — Progress Notes (Signed)
Anti-Coagulation Progress Note  Richard [name] is a 37 y.o. male who is currently on an anti-coagulation regimen.    RECENT RESULTS: Recent results are below, the most recent result is correlated with a dose of 62.5 mg. per week: Lab Results  Component Value Date   INR 2.60 12/18/2012   INR 4.10 12/04/2012   INR 3.10 11/06/2012   PROTIME 20.4* 03/16/2011    ANTI-COAG DOSE: Anticoagulation Dose Instructions as of 12/18/2012     [name] Tue Wed Thu Fri Sat   New Dose 10 mg 10 mg 7.5 mg 10 mg 7.5 mg 10 mg 7.5 mg       ANTICOAG SUMMARY: Anticoagulation Episode Summary   Current INR goal 2.0-3.0  Next INR check 01/22/2013  INR from last check 2.60 (12/18/2012)  Weekly max dose   Target end date Indefinite  [facility] check location [facility]  Preferred lab   Send INR reminders to    Indications  Pulmonary embolism and infarction [415.19] D V T [451.19] Encounter for long-term (current) use of anticoagulants [V58.61]        Comments         ANTICOAG TODAY: Anticoagulation Summary as of 12/18/2012   INR goal 2.0-3.0  Selected INR 2.60 (12/18/2012)  Next INR check 01/22/2013  Target end date Indefinite   Indications  Pulmonary embolism and infarction [415.19] D V T [451.19] Encounter for long-term (current) use of anticoagulants [V58.61]      Anticoagulation Episode Summary   [facility] check location [facility]   Preferred lab    Send INR reminders to    Comments       PATIENT INSTRUCTIONS: Patient Instructions  Patient instructed to take medications as defined in the Anti-coagulation Track section of this encounter.  Patient instructed to take today's dose.  Patient verbalized understanding of these instructions.       FOLLOW-UP Return in 5 weeks (on 01/22/2013) for Follow up INR at 3PM.  [name], III Pharm.D., CACP

## 2012-12-18 NOTE — Progress Notes (Signed)
Indication: Recurrent pulmonary embolism.  Duration: Lifelong.  INR at target.  Agree with Dr. Saralyn Pilar assessment and plan as documented.

## 2012-12-28 ENCOUNTER — Encounter: Payer: Self-pay | Admitting: *Deleted

## 2012-12-28 ENCOUNTER — Encounter: Payer: Self-pay | Admitting: Internal Medicine

## 2012-12-28 ENCOUNTER — Ambulatory Visit (INDEPENDENT_AMBULATORY_CARE_PROVIDER_SITE_OTHER): Payer: No Typology Code available for payment source | Admitting: Internal Medicine

## 2012-12-28 VITALS — BP 112/66 | HR 80 | Temp 97.4°F | Ht 72.0 in | Wt 190.3 lb

## 2012-12-28 DIAGNOSIS — Z23 Encounter for immunization: Secondary | ICD-10-CM

## 2012-12-28 DIAGNOSIS — K5289 Other specified noninfective gastroenteritis and colitis: Secondary | ICD-10-CM

## 2012-12-28 DIAGNOSIS — F17200 Nicotine dependence, unspecified, uncomplicated: Secondary | ICD-10-CM

## 2012-12-28 DIAGNOSIS — K529 Noninfective gastroenteritis and colitis, unspecified: Secondary | ICD-10-CM

## 2012-12-28 DIAGNOSIS — R197 Diarrhea, unspecified: Secondary | ICD-10-CM

## 2012-12-28 DIAGNOSIS — Z720 Tobacco use: Secondary | ICD-10-CM

## 2012-12-28 NOTE — Assessment & Plan Note (Addendum)
Mild nausea and loose stools since Sunday evening. No emesis. Denies fevers, chills, blood in his stool, difficulty urinating, or sick contacts. Feels like sx are improving. Taking good amount of liquids, but reduced appetite for food- per pt, his appetite is improving. Sx are likely from viral gastroenteritis. Stool cultures, C. diff, and gastrointestinal pathogen panel sent off. C. diff negative.

## 2012-12-28 NOTE — Progress Notes (Signed)
Patient ID: Richard [name], male   DOB: 1975/12/31, 37 y.o.   MRN: [id number]  Subjective:   Patient ID: Richard [name] male   DOB: Nov 17, 1975 37 y.o.   MRN: [id number]  HPI: Richard [name] is a 37 y.o. M with PMH DVT and PE on lifelong Coumadin presents with gastroenteritis. Pt with mild nausea and loose stools since Sunday evening. No emesis. Denies fevers, chills, blood in his stool, difficulty urinating, or sick contacts. Feels like sx are improving. Taking good amount of liquids, but reduced appetite for food- per pt, his appetite is improving.   Past Medical History  Diagnosis Date  . DVT (deep venous thrombosis)   . Seasonal allergies    Current Outpatient Prescriptions  Medication Sig Dispense Refill  . warfarin (COUMADIN) 5 MG tablet Take 2 tablets (10 mg total) by mouth daily. Take as directed by anticoagulation clinic provider.  60 tablet  2   No current facility-administered medications for this visit.   Family History  Problem Relation Age of Onset  . Other Mother   . Other Father   . Pulmonary embolism Maternal Uncle    History   Social History  . Marital Status: Single    Spouse Name: N/A    Number of Children: N/A  . Years of Education: N/A   Social History Main Topics  . Smoking status: Former Smoker -- 1 years    Types: Cigarettes, Cigars    Quit date: 04/05/2012  . Smokeless tobacco: None  . Alcohol Use: Yes  . Drug Use: No  . Sexually Active: None   Other Topics Concern  . None   Social History Narrative  . None   Review of Systems: A 10 point ROS was performed; pertinent positives and negatives were noted in the HPI   Objective:  Physical Exam: Filed Vitals:   12/28/12 1523  BP: 112/66  Pulse: 80  Temp: 97.4 F (36.3 C)  TempSrc: Oral  Height: 6' (1.829 m)  Weight: 190 lb 4.8 oz (86.32 kg)  SpO2: 95%   Constitutional: Vital signs reviewed.  Patient is a well-developed and well-nourished male in no acute distress and cooperative with  exam.  Head: Normocephalic and atraumatic Eyes: PERRL, EOMI, conjunctivae normal, No scleral icterus.  Neck: Supple, Trachea midline normal ROM, No JVD, mass, thyromegaly Cardiovascular: RRR, S1 normal, S2 normal, no MRG, pulses symmetric and intact bilaterally Pulmonary/Chest: CTAB, no wheezes, rales, or rhonchi Abdominal: Soft. Non-tender, non-distended, bowel sounds are normal, no masses, organomegaly, or guarding present.  Musculoskeletal: No joint deformities, erythema, or stiffness, ROM full and no nontender Hematology: No cervical adenopathy.  Neurological: A&O x3, non-focal Skin: Warm, dry and intact. No rash, cyanosis, or clubbing.  Psychiatric: Normal mood and affect.   Assessment & Plan:   Please refer to Problem List based Assessment and Plan

## 2012-12-28 NOTE — Assessment & Plan Note (Signed)
Pt state that he quit smoking last summer.

## 2012-12-28 NOTE — Progress Notes (Unsigned)
Pt presents to front desk c/o abd discomfort and "watery diarrhea" x 4 days. He states he does not truly feel bad and has no fevers but he is concerned. In assessing the pt it is discovered he has had no appts except w/ dr [place] Lodge for coumadin therapy, he is counseled that he does need to see his pcp at least 1 time a year and possibly more due to his health problems. He states it would have to be later as it will be but appt is scheduled this pm w/ dr Sherrine Maples at 1515. He is also given stool sample kit in the case that samples need to be obtained.  If attending or dr Sherrine Maples would like to add orders for stool sample before visit it would be appreciated. Thank you

## 2012-12-28 NOTE — Addendum Note (Signed)
Addended by: Maura Crandall on: 12/28/2012 04:47 PM   Modules accepted: Orders

## 2012-12-28 NOTE — Progress Notes (Signed)
To be seen today in clinic.

## 2012-12-28 NOTE — Patient Instructions (Addendum)
**Try to drink as much fluids as you can, esp water to stay hydrated.   **If the loose stools continue and your appetite does not improve over the weekend, please call the clinic on Monday. I will call you with the results from your stool cultures.  **Please follow up with Dr. Everardo Beals, your PCP sometime in August for your yearly physical. Viral Gastroenteritis Viral gastroenteritis is also known as stomach flu. This condition affects the stomach and intestinal tract. It can cause sudden diarrhea and vomiting. The illness typically lasts 3 to 8 days. Most people develop an immune response that eventually gets rid of the virus. While this natural response develops, the virus can make you quite ill. CAUSES  Many different viruses can cause gastroenteritis, such as rotavirus or noroviruses. You can catch one of these viruses by consuming contaminated food or water. You may also catch a virus by sharing utensils or other personal items with an infected person or by touching a contaminated surface. SYMPTOMS  The most common symptoms are diarrhea and vomiting. These problems can cause a severe loss of body fluids (dehydration) and a body salt (electrolyte) imbalance. Other symptoms may include:  Fever.  Headache.  Fatigue.  Abdominal pain. DIAGNOSIS  Your caregiver can usually diagnose viral gastroenteritis based on your symptoms and a physical exam. A stool sample may also be taken to test for the presence of viruses or other infections. TREATMENT  This illness typically goes away on its own. Treatments are aimed at rehydration. The most serious cases of viral gastroenteritis involve vomiting so severely that you are not able to keep fluids down. In these cases, fluids must be given through an intravenous line (IV). HOME CARE INSTRUCTIONS   Drink enough fluids to keep your urine clear or pale yellow. Drink small amounts of fluids frequently and increase the amounts as tolerated.  Ask your  caregiver for specific rehydration instructions.  Avoid:  Foods high in sugar.  Alcohol.  Carbonated drinks.  Tobacco.  Juice.  Caffeine drinks.  Extremely hot or cold fluids.  Fatty, greasy foods.  Too much intake of anything at one time.  Dairy products until 24 to 48 hours after diarrhea stops.  You may consume probiotics. Probiotics are active cultures of beneficial bacteria. They may lessen the amount and number of diarrheal stools in adults. Probiotics can be found in yogurt with active cultures and in supplements.  Wash your hands well to avoid spreading the virus.  Only take over-the-counter or prescription medicines for pain, discomfort, or fever as directed by your caregiver. Do not give aspirin to children. Antidiarrheal medicines are not recommended.  Ask your caregiver if you should continue to take your regular prescribed and over-the-counter medicines.  Keep all follow-up appointments as directed by your caregiver. SEEK IMMEDIATE MEDICAL CARE IF:   You are unable to keep fluids down.  You do not urinate at least once every 6 to 8 hours.  You develop shortness of breath.  You notice blood in your stool or vomit. This may look like coffee grounds.  You have abdominal pain that increases or is concentrated in one small area (localized).  You have persistent vomiting or diarrhea.  You have a fever.  The patient is a child younger than 3 months, and he or she has a fever.  The patient is a child older than 3 months, and he or she has a fever and persistent symptoms.  The patient is a child older than 3 months, and  he or she has a fever and symptoms suddenly get worse.  The patient is a baby, and he or she has no tears when crying. MAKE SURE YOU:   Understand these instructions.  Will watch your condition.  Will get help right away if you are not doing well or get worse. Document Released: 08/09/2005 Document Revised: 11/01/2011 Document  Reviewed: 05/26/2011 [facility] Patient Information 2013 [place], [place].

## 2012-12-29 LAB — GASTROINTESTINAL PATHOGEN PANEL PCR
C. difficile Tox A/B, PCR: NEGATIVE
Campylobacter, PCR: NEGATIVE
Cryptosporidium, PCR: NEGATIVE
Giardia lamblia, PCR: NEGATIVE
Rotavirus A, PCR: NEGATIVE

## 2012-12-29 NOTE — Progress Notes (Signed)
Case discussed with Dr. Sherrine Maples immediately after the resident saw the patient.  We reviewed the resident's history and exam and pertinent patient test results.  I agree with the assessment, diagnosis and plan of care documented in the resident's note.

## 2013-01-01 LAB — STOOL CULTURE

## 2013-01-05 LAB — CLOSTRIDIUM DIFFICILE BY PCR: Toxigenic C. Difficile by PCR: NOT DETECTED

## 2013-01-12 ENCOUNTER — Encounter: Payer: Self-pay | Admitting: Internal Medicine

## 2013-01-12 ENCOUNTER — Encounter: Payer: Self-pay | Admitting: *Deleted

## 2013-01-12 ENCOUNTER — Ambulatory Visit (INDEPENDENT_AMBULATORY_CARE_PROVIDER_SITE_OTHER): Payer: No Typology Code available for payment source | Admitting: Internal Medicine

## 2013-01-12 VITALS — BP 116/77 | HR 77 | Temp 97.6°F | Ht 72.0 in | Wt 189.4 lb

## 2013-01-12 DIAGNOSIS — H1045 Other chronic allergic conjunctivitis: Secondary | ICD-10-CM

## 2013-01-12 DIAGNOSIS — H1012 Acute atopic conjunctivitis, left eye: Secondary | ICD-10-CM

## 2013-01-12 DIAGNOSIS — K5289 Other specified noninfective gastroenteritis and colitis: Secondary | ICD-10-CM

## 2013-01-12 DIAGNOSIS — K529 Noninfective gastroenteritis and colitis, unspecified: Secondary | ICD-10-CM

## 2013-01-12 MED ORDER — CETIRIZINE HCL 10 MG PO TABS: 10.0000 mg | ORAL_TABLET | Freq: Every day | ORAL | Status: DC

## 2013-01-12 NOTE — Patient Instructions (Signed)
Please make followup appointment as needed.  Start taking Zyrtec 10 mg daily.  If your symptoms started getting worse- he started having eye pain, vision changes, fever, headache give [place] a call.

## 2013-01-12 NOTE — Progress Notes (Signed)
Pt walked into clinic with c/o redness, swelling to left eye.  Onset yesterday.   Tried OTC eye drops with no relief.  Will see now.

## 2013-01-12 NOTE — Progress Notes (Signed)
  Subjective:    Patient ID: Richard [name], male    DOB: Dec 02, 1975, 37 y.o.   MRN: [id number]  HPI patient is a pleasant 37 year old male was history of DVT and PE on lifelong Coumadin who comes in clinic for left eye redness and watering.  Vision has left eye redness and watering for past few days. Denies any trauma, inciting event. Had these symptoms long time before once but does not remember why. Denies any itching, pain, pain with eye movements, vision changes, eye crusting or discharge. Denies any earache, headache, sore throat, anterior or post nasal drip or sinus congestion.   His nausea has resolved for which he was seen on 12/28/2012. Review of Systems    as per history of present illness. Objective:   Physical Exam  General: NAD HEENT: PERRL, EOMI, no scleral icterus. Left eye redness and watering. Puffiness of left eyelid. Cardiac: S1, S2 RRR, no rubs, murmurs or gallops Pulm: clear to auscultation bilaterally, moving normal volumes of air Abd: soft, nontender, nondistended, BS present Ext: warm and well perfused, no pedal edema Neuro: alert and oriented X3, cranial nerves II-XII grossly intact       Assessment & Plan:

## 2013-01-12 NOTE — Assessment & Plan Note (Signed)
Resolved

## 2013-01-12 NOTE — Assessment & Plan Note (Signed)
Patient most likely has allergic conjunctivitis of left eye.  No symptoms concerning for infection or any other problems.  - Zyrtec 10 mg daily.

## 2013-01-16 ENCOUNTER — Encounter: Payer: Self-pay | Admitting: Internal Medicine

## 2013-01-16 ENCOUNTER — Ambulatory Visit (INDEPENDENT_AMBULATORY_CARE_PROVIDER_SITE_OTHER): Payer: No Typology Code available for payment source | Admitting: Internal Medicine

## 2013-01-16 VITALS — BP 114/70 | HR 75 | Temp 96.9°F | Ht 72.0 in | Wt 190.9 lb

## 2013-01-16 DIAGNOSIS — H1045 Other chronic allergic conjunctivitis: Secondary | ICD-10-CM

## 2013-01-16 DIAGNOSIS — H1012 Acute atopic conjunctivitis, left eye: Secondary | ICD-10-CM

## 2013-01-16 NOTE — Assessment & Plan Note (Signed)
Patient was started on Zyrtec during last visit on 01/12/2013. On Saturday he felt more itching in his left eye and so he went to emergency room in [place], where he was treated as infectious conjunctivitis and was given antibiotic eye drops which he is using since then along with Zyrtec.  - Wanted a letter for return to work-I mentioned in that about his active treatment with antibiotic eye drops and allergic conjunctivitis.

## 2013-01-16 NOTE — Patient Instructions (Signed)
Please make follow appointment as needed.  Continue taking Coumadin regularly.  Complete the course of the antibiotic drops and also keep taking Zyrtec.

## 2013-01-16 NOTE — Progress Notes (Signed)
  Subjective:    Patient ID: Richard [name], male    DOB: July 28, 1976, 37 y.o.   MRN: [id number]  HPI patient is a pleasant 37 year old man with history of DVT and PE who comes to the clinic for followup visit of his red eye. I saw him on 01/12/2013 and thought he had allergic conjunctivitis and was given Zyrtec. He took Zyrtec on Friday, but then felt worsening of itching on Saturday which prompted him to go to the ED in [place] where he was treated as bacterial conjunctivitis and was given antibiotic eye drops for 7 days. He is using the eyedrops along with Zyrtec. He does report improvement in symptoms.  Denies any new symptoms including vision changes, pain, fever, chills, pain with eye movements, headache, nausea vomiting.  He wants a letter to be written for work which says what he is being treated for.    Review of Systems    As per history of present illness  Objective:   Physical Exam General: NAD HEENT: PERRL, EOMI, no scleral icterus. Decreased redness of left eye. No discharge or crusting noted. Cardiac: RRR, no rubs, murmurs or gallops Pulm: clear to auscultation bilaterally, moving normal volumes of air Abd: soft, nontender, nondistended, BS present Ext: warm and well perfused, no pedal edema Neuro: alert and oriented X3, cranial nerves II-XII grossly intact        Assessment & Plan:

## 2013-01-16 NOTE — Progress Notes (Signed)
Case discussed with Dr. Ravi Patel  at the time of the visit, immediately after the resident saw the patient.  I reviewed the resident's history and exam and pertinent patient test results.  I agree with the assessment, diagnosis and plan of care documented in the resident's note.  

## 2013-01-22 ENCOUNTER — Ambulatory Visit (INDEPENDENT_AMBULATORY_CARE_PROVIDER_SITE_OTHER): Payer: No Typology Code available for payment source | Admitting: Pharmacist

## 2013-01-22 DIAGNOSIS — I2699 Other pulmonary embolism without acute cor pulmonale: Secondary | ICD-10-CM

## 2013-01-22 DIAGNOSIS — I80299 Phlebitis and thrombophlebitis of other deep vessels of unspecified lower extremity: Secondary | ICD-10-CM

## 2013-01-22 DIAGNOSIS — Z7901 Long term (current) use of anticoagulants: Secondary | ICD-10-CM

## 2013-01-22 LAB — POCT INR: INR: 2.6

## 2013-01-22 NOTE — Progress Notes (Signed)
Anti-Coagulation Progress Note  Richard [name] is a 37 y.o. male who is currently on an anti-coagulation regimen.    RECENT RESULTS: Recent results are below, the most recent result is correlated with a dose of 62.5 mg. per week: Lab Results  Component Value Date   INR 2.60 01/22/2013   INR 2.60 12/18/2012   INR 4.10 12/04/2012   PROTIME 20.4* 03/16/2011    ANTI-COAG DOSE: Anticoagulation Dose Instructions as of 01/22/2013     Richard [name] Tue Wed Thu Fri Sat   New Dose 10 mg 10 mg 7.5 mg 10 mg 7.5 mg 10 mg 7.5 mg       ANTICOAG SUMMARY: Anticoagulation Episode Summary   Current INR goal 2.0-3.0  Next INR check 02/19/2013  INR from last check 2.60 (01/22/2013)  Weekly max dose   Target end date Indefinite  INR check location [facility]  Preferred lab   Send INR reminders to    Indications  Pulmonary embolism and infarction [415.19] D V T [451.19] Encounter for long-term (current) use of anticoagulants [V58.61]        Comments         ANTICOAG TODAY: Anticoagulation Summary as of 01/22/2013   INR goal 2.0-3.0  Selected INR 2.60 (01/22/2013)  Next INR check 02/19/2013  Target end date Indefinite   Indications  Pulmonary embolism and infarction [415.19] D V T [451.19] Encounter for long-term (current) use of anticoagulants [V58.61]      Anticoagulation Episode Summary   INR check location [facility]   Preferred lab    Send INR reminders to    Comments       PATIENT INSTRUCTIONS: Patient Instructions  Patient instructed to take medications as defined in the Anti-coagulation Track section of this encounter.  Patient instructed to take today's dose.  Patient verbalized understanding of these instructions.       FOLLOW-UP Return in 4 weeks (on 02/19/2013) for Follow up INR at 3:30PM.  Richard [name], III Pharm.D., CACP

## 2013-01-22 NOTE — Patient Instructions (Signed)
Patient instructed to take medications as defined in the Anti-coagulation Track section of this encounter.  Patient instructed to take today's dose.  Patient verbalized understanding of these instructions.    

## 2013-02-19 ENCOUNTER — Ambulatory Visit (INDEPENDENT_AMBULATORY_CARE_PROVIDER_SITE_OTHER): Payer: No Typology Code available for payment source | Admitting: Pharmacist

## 2013-02-19 DIAGNOSIS — I2699 Other pulmonary embolism without acute cor pulmonale: Secondary | ICD-10-CM

## 2013-02-19 DIAGNOSIS — I80299 Phlebitis and thrombophlebitis of other deep vessels of unspecified lower extremity: Secondary | ICD-10-CM

## 2013-02-19 DIAGNOSIS — Z7901 Long term (current) use of anticoagulants: Secondary | ICD-10-CM

## 2013-02-19 LAB — POCT INR: INR: 1.5

## 2013-02-19 MED ORDER — WARFARIN SODIUM 5 MG PO TABS: 10.0000 mg | ORAL_TABLET | Freq: Every day | ORAL | Status: DC

## 2013-02-19 NOTE — Patient Instructions (Signed)
Patient instructed to take medications as defined in the Anti-coagulation Track section of this encounter.  Patient instructed to take today's dose.  Patient verbalized understanding of these instructions.    

## 2013-02-19 NOTE — Progress Notes (Signed)
Anti-Coagulation Progress Note  Richard [name] is a 37 y.o. male who is currently on an anti-coagulation regimen.    RECENT RESULTS: Recent results are below, the most recent result is correlated with a dose of 62.5 mg. per week: Lab Results  Component Value Date   INR 1.50 02/19/2013   INR 2.60 01/22/2013   INR 2.60 12/18/2012   PROTIME 20.4* 03/16/2011    ANTI-COAG DOSE: Anticoagulation Dose Instructions as of 02/19/2013     [name] Tue Wed Thu Fri Sat   New Dose 10 mg 10 mg 10 mg 10 mg 10 mg 10 mg 10 mg       ANTICOAG SUMMARY: Anticoagulation Episode Summary   Current INR goal 2.0-3.0  Next INR check 03/05/2013  INR from last check 1.50! (02/19/2013)  Weekly max dose   Target end date Indefinite  [facility] check location [facility]  Preferred lab   Send INR reminders to    Indications  Pulmonary embolism and infarction [415.19] D V T [451.19] Encounter for long-term (current) use of anticoagulants [V58.61]        Comments         ANTICOAG TODAY: Anticoagulation Summary as of 02/19/2013   INR goal 2.0-3.0  Selected INR 1.50! (02/19/2013)  Next INR check 03/05/2013  Target end date Indefinite   Indications  Pulmonary embolism and infarction [415.19] D V T [451.19] Encounter for long-term (current) use of anticoagulants [V58.61]      Anticoagulation Episode Summary   INR check location [facility]   Preferred lab    Send INR reminders to    Comments       PATIENT INSTRUCTIONS: Patient Instructions  Patient instructed to take medications as defined in the Anti-coagulation Track section of this encounter.  Patient instructed to take today's dose.  Patient verbalized understanding of these instructions.       FOLLOW-UP Return in 2 weeks (on 03/05/2013) for Follow up INR at 3:45PM.  [name], III Pharm.D., CACP

## 2013-03-05 ENCOUNTER — Ambulatory Visit (INDEPENDENT_AMBULATORY_CARE_PROVIDER_SITE_OTHER): Payer: No Typology Code available for payment source | Admitting: Pharmacist

## 2013-03-05 DIAGNOSIS — I80299 Phlebitis and thrombophlebitis of other deep vessels of unspecified lower extremity: Secondary | ICD-10-CM

## 2013-03-05 DIAGNOSIS — I2699 Other pulmonary embolism without acute cor pulmonale: Secondary | ICD-10-CM

## 2013-03-05 DIAGNOSIS — Z7901 Long term (current) use of anticoagulants: Secondary | ICD-10-CM

## 2013-03-05 MED ORDER — WARFARIN SODIUM 5 MG PO TABS: ORAL_TABLET | ORAL | Status: DC

## 2013-03-05 NOTE — Progress Notes (Signed)
Anti-Coagulation Progress Note  Richard [name] is a 37 y.o. male who is currently on an anti-coagulation regimen.    RECENT RESULTS: Recent results are below, the most recent result is correlated with a dose of 70 mg. per week: Lab Results  Component Value Date   INR 1.80 03/05/2013   INR 1.50 02/19/2013   INR 2.60 01/22/2013   PROTIME 20.4* 03/16/2011    ANTI-COAG DOSE: Anticoagulation Dose Instructions as of 03/05/2013     [name] Tue Wed Thu Fri Sat   New Dose 10 mg 12.5 mg 12.5 mg 12.5 mg 10 mg 12.5 mg 10 mg       ANTICOAG SUMMARY: Anticoagulation Episode Summary   Current INR goal 2.0-3.0  Next INR check 03/19/2013  INR from last check 1.80! (03/05/2013)  Weekly max dose   Target end date Indefinite  [facility]R check location [facility]  Preferred lab   Send INR reminders to    Indications  Pulmonary embolism and infarction [415.19] D V T [451.19] Encounter for long-term (current) use of anticoagulants [V58.61]        Comments         ANTICOAG TODAY: Anticoagulation Summary as of 03/05/2013   INR goal 2.0-3.0  Selected INR 1.80! (03/05/2013)  Next INR check 03/19/2013  Target end date Indefinite   Indications  Pulmonary embolism and infarction [415.19] D V T [451.19] Encounter for long-term (current) use of anticoagulants [V58.61]      Anticoagulation Episode Summary   INR check location [facility]   Preferred lab    Send INR reminders to    Comments       PATIENT INSTRUCTIONS: Patient Instructions  Patient instructed to take medications as defined in the Anti-coagulation Track section of this encounter.  Patient instructed to take today's dose.  Patient verbalized understanding of these instructions.       FOLLOW-UP Return in 2 weeks (on 03/19/2013) for Follow up INR at 4PM.  [name], III Pharm.D., CACP

## 2013-03-05 NOTE — Patient Instructions (Signed)
Patient instructed to take medications as defined in the Anti-coagulation Track section of this encounter.  Patient instructed to take today's dose.  Patient verbalized understanding of these instructions.    

## 2013-03-19 ENCOUNTER — Ambulatory Visit (INDEPENDENT_AMBULATORY_CARE_PROVIDER_SITE_OTHER): Payer: No Typology Code available for payment source | Admitting: Pharmacist

## 2013-03-19 DIAGNOSIS — I80299 Phlebitis and thrombophlebitis of other deep vessels of unspecified lower extremity: Secondary | ICD-10-CM

## 2013-03-19 DIAGNOSIS — I2699 Other pulmonary embolism without acute cor pulmonale: Secondary | ICD-10-CM

## 2013-03-19 DIAGNOSIS — Z7901 Long term (current) use of anticoagulants: Secondary | ICD-10-CM

## 2013-03-19 MED ORDER — WARFARIN SODIUM 5 MG PO TABS: ORAL_TABLET | ORAL | Status: DC

## 2013-03-19 NOTE — Progress Notes (Signed)
Anti-Coagulation Progress Note  Richard [name] is a 37 y.o. male who is currently on an anti-coagulation regimen.    RECENT RESULTS: Recent results are below, the most recent result is correlated with a dose of 80 mg. per week:  Will reduce to 77.5mg  per week.  Lab Results  Component Value Date   INR 3.2 03/19/2013   INR 1.80 03/05/2013   INR 1.50 02/19/2013   PROTIME 20.4* 03/16/2011    ANTI-COAG DOSE: Anticoagulation Dose Instructions as of 03/19/2013     [name] Tue Wed Thu Fri Sat   New Dose 10 mg 12.5 mg 10 mg 12.5 mg 10 mg 12.5 mg 10 mg       ANTICOAG SUMMARY: Anticoagulation Episode Summary   Current INR goal 2.0-3.0  Next INR check 04/16/2013  INR from last check 3.2! (03/19/2013)  Weekly max dose   Target end date Indefinite  [facility]R check location [facility]  Preferred lab   Send INR reminders to    Indications  Pulmonary embolism and infarction [415.19] D V T [451.19] Encounter for long-term (current) use of anticoagulants [V58.61]        Comments         ANTICOAG TODAY: Anticoagulation Summary as of 03/19/2013   INR goal 2.0-3.0  Selected INR 3.2! (03/19/2013)  Next INR check 04/16/2013  Target end date Indefinite   Indications  Pulmonary embolism and infarction [415.19] D V T [451.19] Encounter for long-term (current) use of anticoagulants [V58.61]      Anticoagulation Episode Summary   [facility]R check location [facility]   Preferred lab    Send INR reminders to    Comments       PATIENT INSTRUCTIONS: Patient Instructions  Patient instructed to take medications as defined in the Anti-coagulation Track section of this encounter.  Patient instructed to take today's dose.  Patient verbalized understanding of these instructions.       FOLLOW-UP Return in 4 weeks (on 04/16/2013) for Follow up INR at 4:15PM.  [name], III Pharm.D., CACP

## 2013-03-19 NOTE — Patient Instructions (Signed)
Patient instructed to take medications as defined in the Anti-coagulation Track section of this encounter.  Patient instructed to take today's dose.  Patient verbalized understanding of these instructions.    

## 2013-04-16 ENCOUNTER — Ambulatory Visit (INDEPENDENT_AMBULATORY_CARE_PROVIDER_SITE_OTHER): Payer: No Typology Code available for payment source | Admitting: Pharmacist

## 2013-04-16 DIAGNOSIS — I80299 Phlebitis and thrombophlebitis of other deep vessels of unspecified lower extremity: Secondary | ICD-10-CM

## 2013-04-16 DIAGNOSIS — Z7901 Long term (current) use of anticoagulants: Secondary | ICD-10-CM

## 2013-04-16 DIAGNOSIS — I2699 Other pulmonary embolism without acute cor pulmonale: Secondary | ICD-10-CM

## 2013-04-16 LAB — POCT INR: INR: 3.8

## 2013-04-16 MED ORDER — WARFARIN SODIUM 5 MG PO TABS: ORAL_TABLET | ORAL | Status: DC

## 2013-04-16 NOTE — Patient Instructions (Signed)
Patient instructed to take medications as defined in the Anti-coagulation Track section of this encounter.  Patient instructed to take today's dose.  Patient verbalized understanding of these instructions.    

## 2013-04-16 NOTE — Progress Notes (Signed)
Anti-Coagulation Progress Note  Richard [name] is a 37 y.o. male who is currently on an anti-coagulation regimen.    RECENT RESULTS: Recent results are below, the most recent result is correlated with a dose of 77.5 mg. per week: Lab Results  Component Value Date   INR 3.80 04/16/2013   INR 3.2 03/19/2013   INR 1.80 03/05/2013   PROTIME 20.4* 03/16/2011    ANTI-COAG DOSE: Anticoagulation Dose Instructions as of 04/16/2013     [name] Tue Wed Thu Fri Sat   New Dose 10 mg 12.5 mg 10 mg 10 mg 10 mg 10 mg 10 mg       ANTICOAG SUMMARY: Anticoagulation Episode Summary   Current INR goal 2.0-3.0  Next INR check 04/30/2013  INR from last check 3.80! (04/16/2013)  Weekly max dose   Target end date Indefinite  [facility]R check location [facility]  Preferred lab   Send INR reminders to    Indications  Pulmonary embolism and infarction [415.19] D V T [451.19] Encounter for long-term (current) use of anticoagulants [V58.61]        Comments         ANTICOAG TODAY: Anticoagulation Summary as of 04/16/2013   INR goal 2.0-3.0  Selected INR 3.80! (04/16/2013)  Next INR check 04/30/2013  Target end date Indefinite   Indications  Pulmonary embolism and infarction [415.19] D V T [451.19] Encounter for long-term (current) use of anticoagulants [V58.61]      Anticoagulation Episode Summary   INR check location [facility]   Preferred lab    Send INR reminders to    Comments       PATIENT INSTRUCTIONS: Patient Instructions  Patient instructed to take medications as defined in the Anti-coagulation Track section of this encounter.  Patient instructed to take today's dose.  Patient verbalized understanding of these instructions.       FOLLOW-UP Return in 2 weeks (on 04/30/2013) for Follow up INR at 4:15PM.  [name], III Pharm.D., CACP

## 2013-04-30 ENCOUNTER — Ambulatory Visit (INDEPENDENT_AMBULATORY_CARE_PROVIDER_SITE_OTHER): Payer: No Typology Code available for payment source | Admitting: Pharmacist

## 2013-04-30 DIAGNOSIS — I80299 Phlebitis and thrombophlebitis of other deep vessels of unspecified lower extremity: Secondary | ICD-10-CM

## 2013-04-30 DIAGNOSIS — I2699 Other pulmonary embolism without acute cor pulmonale: Secondary | ICD-10-CM

## 2013-04-30 DIAGNOSIS — Z7901 Long term (current) use of anticoagulants: Secondary | ICD-10-CM

## 2013-05-01 LAB — POCT INR: INR: 4.6

## 2013-05-01 NOTE — Progress Notes (Signed)
Anti-Coagulation Progress Note  Richard [name] is a 37 y.o. male who is currently on an anti-coagulation regimen.    RECENT RESULTS: Recent results are below, the most recent result is correlated with a dose of 72.5 mg. per week: Lab Results  Component Value Date   INR 4.6 05/01/2013   INR Final Result from POC Testing 04/30/2013   INR 3.80 04/16/2013   PROTIME 20.4* 03/16/2011    ANTI-COAG DOSE: Anticoagulation Dose Instructions as of 04/30/2013     [name] Tue Wed Thu Fri Sat   New Dose 7.5 mg 12.5 mg 7.5 mg 10 mg 10 mg 7.5 mg 10 mg       ANTICOAG SUMMARY: Anticoagulation Episode Summary   Current INR goal 2.0-3.0  Next INR check 05/07/2013  INR from last check Final Result from POC Testing (04/30/2013)  The INR is not numeric and cannot be flagged as normal/abnormal.  Most recent INR 4.6! (05/01/2013)  Weekly max dose   Target end date Indefinite  [facility]R check location [facility]  Preferred lab   Send INR reminders to    Indications  Pulmonary embolism and infarction [415.19] D V T [451.19] Encounter for long-term (current) use of anticoagulants [V58.61]        Comments         ANTICOAG TODAY: Anticoagulation Summary as of 04/30/2013   INR goal 2.0-3.0  Selected INR Final Result from POC Testing (04/30/2013)  The INR is not numeric and cannot be flagged as normal/abnormal.  Next INR check 05/07/2013  Target end date Indefinite   Indications  Pulmonary embolism and infarction [415.19] D V T [451.19] Encounter for long-term (current) use of anticoagulants [V58.61]      Anticoagulation Episode Summary   INR check location [facility]   Preferred lab    Send INR reminders to    Comments       PATIENT INSTRUCTIONS: Patient Instructions  Patient instructed to take medications as defined in the Anti-coagulation Track section of this encounter.  Patient instructed to take today's dose.  Patient verbalized understanding of these instructions.        FOLLOW-UP Return in 6 days (on 05/07/2013) for Follow up INR at 4:30PM.  [name], III Pharm.D., CACP

## 2013-05-01 NOTE — Patient Instructions (Signed)
Patient instructed to take medications as defined in the Anti-coagulation Track section of this encounter.  Patient instructed to take today's dose.  Patient verbalized understanding of these instructions.    

## 2013-05-02 NOTE — Progress Notes (Signed)
Indication for anticoagulation is DVT. I have reviewed Dr. Saralyn Pilar note.

## 2013-05-07 ENCOUNTER — Ambulatory Visit (INDEPENDENT_AMBULATORY_CARE_PROVIDER_SITE_OTHER): Payer: No Typology Code available for payment source | Admitting: Pharmacist

## 2013-05-07 DIAGNOSIS — I80299 Phlebitis and thrombophlebitis of other deep vessels of unspecified lower extremity: Secondary | ICD-10-CM

## 2013-05-07 DIAGNOSIS — Z7901 Long term (current) use of anticoagulants: Secondary | ICD-10-CM

## 2013-05-07 DIAGNOSIS — I2699 Other pulmonary embolism without acute cor pulmonale: Secondary | ICD-10-CM

## 2013-05-07 MED ORDER — WARFARIN SODIUM 5 MG PO TABS: ORAL_TABLET | ORAL | Status: DC

## 2013-05-07 NOTE — Progress Notes (Signed)
Anti-Coagulation Progress Note  Richard [name] is a 37 y.o. male who is currently on an anti-coagulation regimen.    RECENT RESULTS: Recent results are below, the most recent result is correlated with a dose of 65 mg. per week: Lab Results  Component Value Date   INR 4.40 05/07/2013   INR 4.6 05/01/2013   INR Final Result from POC Testing 04/30/2013   PROTIME 20.4* 03/16/2011    ANTI-COAG DOSE: Anticoagulation Dose Instructions as of 05/07/2013     [name] Tue Wed Thu Fri Sat   New Dose 7.5 mg 10 mg 7.5 mg 7.5 mg 10 mg 7.5 mg 7.5 mg       ANTICOAG SUMMARY: Anticoagulation Episode Summary   Current INR goal 2.0-3.0  Next INR check 05/21/2013  INR from last check 4.40! (05/07/2013)  Weekly max dose   Target end date Indefinite  [facility]R check location [facility]  Preferred lab   Send INR reminders to    Indications  Pulmonary embolism and infarction [415.19] D V T [451.19] Encounter for long-term (current) use of anticoagulants [V58.61]        Comments         ANTICOAG TODAY: Anticoagulation Summary as of 05/07/2013   INR goal 2.0-3.0  Selected INR 4.40! (05/07/2013)  Next INR check 05/21/2013  Target end date Indefinite   Indications  Pulmonary embolism and infarction [415.19] D V T [451.19] Encounter for long-term (current) use of anticoagulants [V58.61]      Anticoagulation Episode Summary   INR check location [facility]   Preferred lab    Send INR reminders to    Comments       PATIENT INSTRUCTIONS: Patient Instructions  Patient instructed to take medications as defined in the Anti-coagulation Track section of this encounter.  Patient instructed to take today's dose.  Patient verbalized understanding of these instructions.       FOLLOW-UP Return in 2 weeks (on 05/21/2013) for Follow up INR at 4PM.  [name], III Pharm.D., CACP

## 2013-05-07 NOTE — Patient Instructions (Signed)
Patient instructed to take medications as defined in the Anti-coagulation Track section of this encounter.  Patient instructed to take today's dose.  Patient verbalized understanding of these instructions.    

## 2013-05-21 ENCOUNTER — Ambulatory Visit (INDEPENDENT_AMBULATORY_CARE_PROVIDER_SITE_OTHER): Payer: Self-pay | Admitting: Pharmacist

## 2013-05-21 DIAGNOSIS — Z7901 Long term (current) use of anticoagulants: Secondary | ICD-10-CM

## 2013-05-21 DIAGNOSIS — I2699 Other pulmonary embolism without acute cor pulmonale: Secondary | ICD-10-CM

## 2013-05-21 DIAGNOSIS — I80299 Phlebitis and thrombophlebitis of other deep vessels of unspecified lower extremity: Secondary | ICD-10-CM

## 2013-05-21 LAB — POCT INR: INR: 1.2

## 2013-05-21 MED ORDER — WARFARIN SODIUM 5 MG PO TABS: ORAL_TABLET | ORAL | Status: DC

## 2013-05-21 NOTE — Patient Instructions (Signed)
Patient instructed to take medications as defined in the Anti-coagulation Track section of this encounter.  Patient instructed to take today's dose.  Patient verbalized understanding of these instructions.    

## 2013-05-21 NOTE — Progress Notes (Signed)
Anti-Coagulation Progress Note  Richard [name] is a 37 y.o. male who is currently on an anti-coagulation regimen.    RECENT RESULTS: Recent results are below, the most recent result is correlated with a dose of 57.5 mg. per week: Lab Results  Component Value Date   INR 1.20 05/21/2013   INR 4.40 05/07/2013   INR 4.6 05/01/2013   PROTIME 20.4* 03/16/2011    ANTI-COAG DOSE: Anticoagulation Dose Instructions as of 05/21/2013     [name] Tue Wed Thu Fri Sat   New Dose 7.5 mg 10 mg 7.5 mg 10 mg 7.5 mg 10 mg 7.5 mg       ANTICOAG SUMMARY: Anticoagulation Episode Summary   Current INR goal 2.0-3.0  Next INR check 06/04/2013  INR from last check 1.20! (05/21/2013)  Weekly max dose   Target end date Indefinite  INR check location [facility]  Preferred lab   Send INR reminders to    Indications  Pulmonary embolism and infarction [415.19] D V T [451.19] Encounter for long-term (current) use of anticoagulants [V58.61]        Comments         ANTICOAG TODAY: Anticoagulation Summary as of 05/21/2013   INR goal 2.0-3.0  Selected INR 1.20! (05/21/2013)  Next INR check 06/04/2013  Target end date Indefinite   Indications  Pulmonary embolism and infarction [415.19] D V T [451.19] Encounter for long-term (current) use of anticoagulants [V58.61]      Anticoagulation Episode Summary   INR check location [facility]   Preferred lab    Send INR reminders to    Comments       PATIENT INSTRUCTIONS: Patient Instructions  Patient instructed to take medications as defined in the Anti-coagulation Track section of this encounter.  Patient instructed to take today's dose.  Patient verbalized understanding of these instructions.       FOLLOW-UP Return in 2 weeks (on 06/04/2013) for Follow up INR at 3:15PM.  [name], III Pharm.D., CACP

## 2013-06-04 ENCOUNTER — Ambulatory Visit (INDEPENDENT_AMBULATORY_CARE_PROVIDER_SITE_OTHER): Payer: Self-pay | Admitting: Pharmacist

## 2013-06-04 DIAGNOSIS — I2699 Other pulmonary embolism without acute cor pulmonale: Secondary | ICD-10-CM

## 2013-06-04 DIAGNOSIS — Z7901 Long term (current) use of anticoagulants: Secondary | ICD-10-CM

## 2013-06-04 DIAGNOSIS — I80299 Phlebitis and thrombophlebitis of other deep vessels of unspecified lower extremity: Secondary | ICD-10-CM

## 2013-06-04 LAB — POCT INR: INR: 2.6

## 2013-06-04 NOTE — Progress Notes (Signed)
Indication: Recurrent venous thromboembolism. Duration: Lifelong. INR: At target. Agree with Dr. Groce's assessment and plan. 

## 2013-06-04 NOTE — Patient Instructions (Signed)
Patient instructed to take medications as defined in the Anti-coagulation Track section of this encounter.  Patient instructed to take today's dose.  Patient verbalized understanding of these instructions.    

## 2013-06-04 NOTE — Progress Notes (Signed)
Anti-Coagulation Progress Note  Richard [name] is a 37 y.o. male who is currently on an anti-coagulation regimen.    RECENT RESULTS: Recent results are below, the most recent result is correlated with a dose of 60 mg. per week: Lab Results  Component Value Date   INR 2.60 06/04/2013   INR 1.20 05/21/2013   INR 4.40 05/07/2013   PROTIME 20.4* 03/16/2011    ANTI-COAG DOSE: Anticoagulation Dose Instructions as of 06/04/2013     [name] Tue Wed Thu Fri Sat   New Dose 7.5 mg 10 mg 7.5 mg 10 mg 7.5 mg 10 mg 7.5 mg       ANTICOAG SUMMARY: Anticoagulation Episode Summary   Current INR goal 2.0-3.0  Next INR check 06/25/2013  INR from last check 2.60 (06/04/2013)  Weekly max dose   Target end date Indefinite  INR check location [facility]  Preferred lab   Send INR reminders to    Indications  Pulmonary embolism and infarction [415.19] D V T [451.19] Encounter for long-term (current) use of anticoagulants [V58.61]        Comments         ANTICOAG TODAY: Anticoagulation Summary as of 06/04/2013   INR goal 2.0-3.0  Selected INR 2.60 (06/04/2013)  Next INR check 06/25/2013  Target end date Indefinite   Indications  Pulmonary embolism and infarction [415.19] D V T [451.19] Encounter for long-term (current) use of anticoagulants [V58.61]      Anticoagulation Episode Summary   INR check location [facility]   Preferred lab    Send INR reminders to    Comments       PATIENT INSTRUCTIONS: Patient Instructions  Patient instructed to take medications as defined in the Anti-coagulation Track section of this encounter.  Patient instructed to take today's dose.  Patient verbalized understanding of these instructions.       FOLLOW-UP Return in 3 weeks (on 06/25/2013) for Follow up INR at 3:30PM.  [name], III Pharm.D., CACP

## 2013-06-25 ENCOUNTER — Ambulatory Visit (INDEPENDENT_AMBULATORY_CARE_PROVIDER_SITE_OTHER): Payer: Self-pay | Admitting: Pharmacist

## 2013-06-25 DIAGNOSIS — I2699 Other pulmonary embolism without acute cor pulmonale: Secondary | ICD-10-CM

## 2013-06-25 DIAGNOSIS — Z7901 Long term (current) use of anticoagulants: Secondary | ICD-10-CM

## 2013-06-25 DIAGNOSIS — I80299 Phlebitis and thrombophlebitis of other deep vessels of unspecified lower extremity: Secondary | ICD-10-CM

## 2013-06-25 MED ORDER — WARFARIN SODIUM 5 MG PO TABS: ORAL_TABLET | ORAL | Status: DC

## 2013-06-25 NOTE — Patient Instructions (Signed)
Patient instructed to take medications as defined in the Anti-coagulation Track section of this encounter.  Patient instructed to take today's dose.  Patient verbalized understanding of these instructions.    

## 2013-06-25 NOTE — Progress Notes (Signed)
Anti-Coagulation Progress Note  Richard [name] is a 37 y.o. male who is currently on an anti-coagulation regimen.    RECENT RESULTS: Recent results are below, the most recent result is correlated with a dose of 60 mg. per week: Lab Results  Component Value Date   INR 2.10 06/25/2013   INR 2.60 06/04/2013   INR 1.20 05/21/2013   PROTIME 20.4* 03/16/2011    ANTI-COAG DOSE: Anticoagulation Dose Instructions as of 06/25/2013     [name] Tue Wed Thu Fri Sat   New Dose 7.5 mg 10 mg 10 mg 10 mg 10 mg 10 mg 7.5 mg       ANTICOAG SUMMARY: Anticoagulation Episode Summary   Current INR goal 2.0-3.0  Next INR check 07/16/2013  INR from last check 2.10 (06/25/2013)  Weekly max dose   Target end date Indefinite  [facility]R check location [facility]  Preferred lab   Send INR reminders to    Indications  Pulmonary embolism and infarction [415.19] D V T [451.19] Encounter for long-term (current) use of anticoagulants [V58.61]        Comments         ANTICOAG TODAY: Anticoagulation Summary as of 06/25/2013   INR goal 2.0-3.0  Selected INR 2.10 (06/25/2013)  Next INR check 07/16/2013  Target end date Indefinite   Indications  Pulmonary embolism and infarction [415.19] D V T [451.19] Encounter for long-term (current) use of anticoagulants [V58.61]      Anticoagulation Episode Summary   INR check location [facility]   Preferred lab    Send INR reminders to    Comments       PATIENT INSTRUCTIONS: Patient Instructions  Patient instructed to take medications as defined in the Anti-coagulation Track section of this encounter.  Patient instructed to take today's dose.  Patient verbalized understanding of these instructions.       FOLLOW-UP Return in 3 weeks (on 07/16/2013) for Follow up INR at 4PM.  [name], III Pharm.D., CACP

## 2013-07-16 ENCOUNTER — Ambulatory Visit (INDEPENDENT_AMBULATORY_CARE_PROVIDER_SITE_OTHER): Payer: Self-pay | Admitting: Pharmacist

## 2013-07-16 DIAGNOSIS — Z7901 Long term (current) use of anticoagulants: Secondary | ICD-10-CM

## 2013-07-16 DIAGNOSIS — I2699 Other pulmonary embolism without acute cor pulmonale: Secondary | ICD-10-CM

## 2013-07-16 DIAGNOSIS — I80299 Phlebitis and thrombophlebitis of other deep vessels of unspecified lower extremity: Secondary | ICD-10-CM

## 2013-07-16 MED ORDER — WARFARIN SODIUM 5 MG PO TABS: ORAL_TABLET | ORAL | Status: DC

## 2013-07-16 NOTE — Progress Notes (Signed)
Anti-Coagulation Progress Note  Richard [name] is a 37 y.o. male who is currently on an anti-coagulation regimen.    RECENT RESULTS: Recent results are below, the most recent result is correlated with a dose of 65 mg. per week: Lab Results  Component Value Date   INR 2.40 07/16/2013   INR 2.10 06/25/2013   INR 2.60 06/04/2013   PROTIME 20.4* 03/16/2011    ANTI-COAG DOSE: Anticoagulation Dose Instructions as of 07/16/2013     [name] Tue Wed Thu Fri Sat   New Dose 7.5 mg 10 mg 10 mg 10 mg 10 mg 10 mg 7.5 mg       ANTICOAG SUMMARY: Anticoagulation Episode Summary   Current INR goal 2.0-3.0  Next INR check 08/06/2013  INR from last check 2.40 (07/16/2013)  Weekly max dose   Target end date Indefinite  INR check location [facility]  Preferred lab   Send INR reminders to    Indications  Pulmonary embolism and infarction [415.19] D V T [451.19] Encounter for long-term (current) use of anticoagulants [V58.61]        Comments         ANTICOAG TODAY: Anticoagulation Summary as of 07/16/2013   INR goal 2.0-3.0  Selected INR 2.40 (07/16/2013)  Next INR check 08/06/2013  Target end date Indefinite   Indications  Pulmonary embolism and infarction [415.19] D V T [451.19] Encounter for long-term (current) use of anticoagulants [V58.61]      Anticoagulation Episode Summary   INR check location [facility]   Preferred lab    Send INR reminders to    Comments       PATIENT INSTRUCTIONS: Patient Instructions  Patient instructed to take medications as defined in the Anti-coagulation Track section of this encounter.  Patient instructed to take today's dose.  Patient verbalized understanding of these instructions.       FOLLOW-UP Return in 3 weeks (on 08/06/2013) for Follow up INR at 4:15PM.  [name], III Pharm.D., CACP

## 2013-07-16 NOTE — Patient Instructions (Signed)
Patient instructed to take medications as defined in the Anti-coagulation Track section of this encounter.  Patient instructed to take today's dose.  Patient verbalized understanding of these instructions.    

## 2013-08-06 ENCOUNTER — Ambulatory Visit (INDEPENDENT_AMBULATORY_CARE_PROVIDER_SITE_OTHER): Payer: BC Managed Care – PPO | Admitting: Pharmacist

## 2013-08-06 DIAGNOSIS — Z7901 Long term (current) use of anticoagulants: Secondary | ICD-10-CM

## 2013-08-06 DIAGNOSIS — I2699 Other pulmonary embolism without acute cor pulmonale: Secondary | ICD-10-CM

## 2013-08-06 DIAGNOSIS — I80299 Phlebitis and thrombophlebitis of other deep vessels of unspecified lower extremity: Secondary | ICD-10-CM

## 2013-08-06 NOTE — Patient Instructions (Signed)
Patient instructed to take medications as defined in the Anti-coagulation Track section of this encounter.  Patient instructed to take today's dose.  Patient verbalized understanding of these instructions.    

## 2013-08-06 NOTE — Progress Notes (Signed)
Anti-Coagulation Progress Note  Richard [name] is a 37 y.o. male who is currently on an anti-coagulation regimen.    RECENT RESULTS: Recent results are below, the most recent result is correlated with a dose of 65 mg. per week: Lab Results  Component Value Date   INR 2.20 08/06/2013   INR 2.40 07/16/2013   INR 2.10 06/25/2013   PROTIME 20.4* 03/16/2011    ANTI-COAG DOSE: Anticoagulation Dose Instructions as of 08/06/2013     [name] Tue Wed Thu Fri Sat   New Dose 7.5 mg 10 mg 10 mg 10 mg 10 mg 10 mg 7.5 mg       ANTICOAG SUMMARY: Anticoagulation Episode Summary   Current INR goal 2.0-3.0  Next INR check 08/27/2013  INR from last check 2.20 (08/06/2013)  Weekly max dose   Target end date Indefinite  INR check location [facility]  Preferred lab   Send INR reminders to    Indications  Pulmonary embolism and infarction [415.19] D V T [451.19] Encounter for long-term (current) use of anticoagulants [V58.61]        Comments         ANTICOAG TODAY: Anticoagulation Summary as of 08/06/2013   INR goal 2.0-3.0  Selected INR 2.20 (08/06/2013)  Next INR check 08/27/2013  Target end date Indefinite   Indications  Pulmonary embolism and infarction [415.19] D V T [451.19] Encounter for long-term (current) use of anticoagulants [V58.61]      Anticoagulation Episode Summary   INR check location [facility]   Preferred lab    Send INR reminders to    Comments       PATIENT INSTRUCTIONS: Patient Instructions  Patient instructed to take medications as defined in the Anti-coagulation Track section of this encounter.  Patient instructed to take today's dose.  Patient verbalized understanding of these instructions.       FOLLOW-UP Return in 3 weeks (on 08/27/2013) for Follow up INR at 4:15PM.  [name], III Pharm.D., CACP

## 2013-08-27 ENCOUNTER — Ambulatory Visit (INDEPENDENT_AMBULATORY_CARE_PROVIDER_SITE_OTHER): Payer: BC Managed Care – PPO | Admitting: Pharmacist

## 2013-08-27 DIAGNOSIS — I80299 Phlebitis and thrombophlebitis of other deep vessels of unspecified lower extremity: Secondary | ICD-10-CM

## 2013-08-27 DIAGNOSIS — I2699 Other pulmonary embolism without acute cor pulmonale: Secondary | ICD-10-CM

## 2013-08-27 DIAGNOSIS — Z7901 Long term (current) use of anticoagulants: Secondary | ICD-10-CM

## 2013-08-27 LAB — POCT INR: INR: 2.2

## 2013-08-28 NOTE — Progress Notes (Signed)
Anti-Coagulation Progress Note  Richard GandyMicha [name] is a 38 y.o. male who is currently on an anti-coagulation regimen.    RECENT RESULTS: Recent results are below, the most recent result is correlated with a dose of 65 mg. per week: Lab Results  Component Value Date   INR 2.20 08/27/2013   INR 2.20 08/06/2013   INR 2.40 07/16/2013   PROTIME 20.4* 03/16/2011    ANTI-COAG DOSE: Anticoagulation Dose Instructions as of 08/27/2013     [name] Mon Tue Wed Thu Fri Sat   New Dose 7.5 mg 10 mg 10 mg 10 mg 10 mg 10 mg 7.5 mg       ANTICOAG SUMMARY: Anticoagulation Episode Summary   Current INR goal 2.0-3.0  Next INR check 09/28/2013  INR from last check 2.20 (08/27/2013)  Weekly max dose   Target end date Indefinite  [facility]  Preferred lab   Send INR reminders to    Indications  Pulmonary embolism and infarction [415.19] D V T [451.19] Encounter for long-term (current) use of anticoagulants [V58.61]        Comments         ANTICOAG TODAY: Anticoagulation Summary as of 08/27/2013   INR goal 2.0-3.0  Selected INR 2.20 (08/27/2013)  Next INR check 09/28/2013  Target end date Indefinite   Indications  Pulmonary embolism and infarction [415.19] D V T [451.19] Encounter for long-term (current) use of anticoagulants [V58.61]      Anticoagulation Episode Summary   [facility] check [facility]   Preferred lab    Send INR reminders to    Comments       PATIENT INSTRUCTIONS: Patient Instructions  Patient instructed to take medications as defined in the Anti-coagulation Track section of this encounter.  Patient instructed to take today's dose.  Patient verbalized understanding of these instructions.       FOLLOW-UP Return in 5 weeks (on 09/28/2013) for Follow up INR at 4PM on FRIDAY.  Hulen LusterJames Greysen Swanton, III Pharm.D., CACP

## 2013-08-28 NOTE — Patient Instructions (Signed)
Patient instructed to take medications as defined in the Anti-coagulation Track section of this encounter.  Patient instructed to take today's dose.  Patient verbalized understanding of these instructions.    

## 2013-09-28 ENCOUNTER — Ambulatory Visit (INDEPENDENT_AMBULATORY_CARE_PROVIDER_SITE_OTHER): Payer: BC Managed Care – PPO | Admitting: Pharmacist

## 2013-09-28 DIAGNOSIS — I80299 Phlebitis and thrombophlebitis of other deep vessels of unspecified lower extremity: Secondary | ICD-10-CM

## 2013-09-28 DIAGNOSIS — I2699 Other pulmonary embolism without acute cor pulmonale: Secondary | ICD-10-CM

## 2013-09-28 DIAGNOSIS — Z7901 Long term (current) use of anticoagulants: Secondary | ICD-10-CM

## 2013-09-28 DIAGNOSIS — Z86718 Personal history of other venous thrombosis and embolism: Secondary | ICD-10-CM

## 2013-09-28 LAB — POCT INR: INR: 2.2

## 2013-09-28 NOTE — Progress Notes (Signed)
Anti-Coagulation Progress Note  Berneice GandyMicha Mendel is a 38 y.o. male who is currently on an anti-coagulation regimen.    RECENT RESULTS: Recent results are below, the most recent result is correlated with a dose of 65 mg. per week: Lab Results  Component Value Date   INR 2.20 09/28/2013   INR 2.20 08/27/2013   INR 2.20 08/06/2013   PROTIME 20.4* 03/16/2011    ANTI-COAG DOSE: Anticoagulation Dose Instructions as of 09/28/2013     [name] Mon Tue Wed Thu Fri Sat   New Dose 10 mg 10 mg 10 mg 10 mg 10 mg 10 mg 10 mg       ANTICOAG SUMMARY: Anticoagulation Episode Summary   Current INR goal 2.0-3.0  Next INR check 11/09/2013  INR from last check 2.20 (09/28/2013)  Weekly max dose   Target end date Indefinite  [facility]  Preferred lab   Send INR reminders to    Indications  Pulmonary embolism and infarction [415.19] D V T [451.19] Encounter for long-term (current) use of anticoagulants [V58.61]        Comments         ANTICOAG TODAY: Anticoagulation Summary as of 09/28/2013   INR goal 2.0-3.0  Selected INR 2.20 (09/28/2013)  Next INR check 11/09/2013  Target end date Indefinite   Indications  Pulmonary embolism and infarction [415.19] D V T [451.19] Encounter for long-term (current) use of anticoagulants [V58.61]      Anticoagulation Episode Summary   [facility]   Preferred lab    Send INR reminders to    Comments       PATIENT INSTRUCTIONS: Patient Instructions  Patient instructed to take medications as defined in the Anti-coagulation Track section of this encounter.  Patient instructed to take today's dose.  Patient verbalized understanding of these instructions.       FOLLOW-UP Return in 6 weeks (on 11/09/2013) for Follow up INR at 4PM.  Hulen LusterJames Janeli Lewison, III Pharm.D., CACP

## 2013-09-28 NOTE — Patient Instructions (Signed)
Patient instructed to take medications as defined in the Anti-coagulation Track section of this encounter.  Patient instructed to take today's dose.  Patient verbalized understanding of these instructions.    

## 2013-10-26 ENCOUNTER — Ambulatory Visit (INDEPENDENT_AMBULATORY_CARE_PROVIDER_SITE_OTHER): Payer: BC Managed Care – PPO | Admitting: Pharmacist

## 2013-10-26 DIAGNOSIS — I80299 Phlebitis and thrombophlebitis of other deep vessels of unspecified lower extremity: Secondary | ICD-10-CM

## 2013-10-26 DIAGNOSIS — Z7901 Long term (current) use of anticoagulants: Secondary | ICD-10-CM

## 2013-10-26 DIAGNOSIS — I2699 Other pulmonary embolism without acute cor pulmonale: Secondary | ICD-10-CM

## 2013-10-26 LAB — POCT INR: INR: 3.3

## 2013-10-26 NOTE — Progress Notes (Signed)
Anti-Coagulation Progress Note  Richard [name] is a 38 y.o. male who is currently on an anti-coagulation regimen.    RECENT RESULTS: Recent results are below, the most recent result is correlated with a dose of 70 mg. per week: Lab Results  Component Value Date   INR 3.30 10/26/2013   INR 2.20 09/28/2013   INR 2.20 08/27/2013   PROTIME 20.4* 03/16/2011    ANTI-COAG DOSE: Anticoagulation Dose Instructions as of 10/26/2013     [name] Mon Tue Wed Thu Fri Sat   New Dose 10 mg 10 mg 10 mg 7.5 mg 10 mg 7.5 mg 10 mg       ANTICOAG SUMMARY: Anticoagulation Episode Summary   Current INR goal 2.0-3.0  Next INR check 11/23/2013  INR from last check 3.30! (10/26/2013)  Weekly max dose   Target end date Indefinite  [facility]  Preferred lab   Send INR reminders to    Indications  Pulmonary embolism and infarction [415.19] D V T [451.19] Encounter for long-term (current) use of anticoagulants [V58.61]        Comments         ANTICOAG TODAY: Anticoagulation Summary as of 10/26/2013   INR goal 2.0-3.0  Selected INR 3.30! (10/26/2013)  Next INR check 11/23/2013  Target end date Indefinite   Indications  Pulmonary embolism and infarction [415.19] D V T [451.19] Encounter for long-term (current) use of anticoagulants [V58.61]      Anticoagulation Episode Summary   [facility]   Preferred lab    Send INR reminders to    Comments       PATIENT INSTRUCTIONS: Patient Instructions  Patient instructed to take medications as defined in the Anti-coagulation Track section of this encounter.  Patient instructed to take today's dose.  Patient verbalized understanding of these instructions.       FOLLOW-UP Return in 4 weeks (on 11/23/2013) for Follow up INR at 4PM.  Hulen LusterJames Ozetta Flatley, III Pharm.D., CACP

## 2013-10-26 NOTE — Patient Instructions (Signed)
Patient instructed to take medications as defined in the Anti-coagulation Track section of this encounter.  Patient instructed to take today's dose.  Patient verbalized understanding of these instructions.    

## 2013-11-09 ENCOUNTER — Ambulatory Visit: Payer: BC Managed Care – PPO

## 2013-11-09 ENCOUNTER — Ambulatory Visit: Payer: BC Managed Care – PPO | Admitting: Internal Medicine

## 2013-11-28 ENCOUNTER — Ambulatory Visit (INDEPENDENT_AMBULATORY_CARE_PROVIDER_SITE_OTHER): Payer: BC Managed Care – PPO | Admitting: Pharmacist

## 2013-11-28 DIAGNOSIS — Z7901 Long term (current) use of anticoagulants: Secondary | ICD-10-CM

## 2013-11-28 DIAGNOSIS — I80299 Phlebitis and thrombophlebitis of other deep vessels of unspecified lower extremity: Secondary | ICD-10-CM

## 2013-11-28 DIAGNOSIS — I2699 Other pulmonary embolism without acute cor pulmonale: Secondary | ICD-10-CM

## 2013-11-28 LAB — POCT INR: INR: 1.7

## 2013-11-28 NOTE — Patient Instructions (Signed)
Patient instructed to take medications as defined in the Anti-coagulation Track section of this encounter.  Patient instructed to take today's dose.  Patient verbalized understanding of these instructions.    

## 2013-11-28 NOTE — Progress Notes (Signed)
Anti-Coagulation Progress Note  Richard GandyMicha [name] is a 38 y.o. male who is currently on an anti-coagulation regimen.    RECENT RESULTS: Recent results are below, the most recent result is correlated with a dose of 65 mg. per week: Lab Results  Component Value Date   INR 1.7 11/28/2013   INR 3.30 10/26/2013   INR 2.20 09/28/2013   PROTIME 20.4* 03/16/2011    ANTI-COAG DOSE: Anticoagulation Dose Instructions as of 11/28/2013     [name] Mon Tue Wed Thu Fri Sat   New Dose 10 mg 10 mg 10 mg 10 mg 10 mg 10 mg 10 mg       ANTICOAG SUMMARY: Anticoagulation Episode Summary   Current INR goal 2.0-3.0  Next INR check 12/21/2013  INR from last check 1.7! (11/28/2013)  Weekly max dose   Target end date Indefinite  [facility]  Preferred lab   Send INR reminders to    Indications  Pulmonary embolism and infarction [415.19] D V T [451.19] Encounter for long-term (current) use of anticoagulants [V58.61]        Comments         ANTICOAG TODAY: Anticoagulation Summary as of 11/28/2013   INR goal 2.0-3.0  Selected INR 1.7! (11/28/2013)  Next INR check 12/21/2013  Target end date Indefinite   Indications  Pulmonary embolism and infarction [415.19] D V T [451.19] Encounter for long-term (current) use of anticoagulants [V58.61]      Anticoagulation Episode Summary   [facility]   Preferred lab    Send INR reminders to    Comments       PATIENT INSTRUCTIONS: Patient Instructions  Patient instructed to take medications as defined in the Anti-coagulation Track section of this encounter.  Patient instructed to take today's dose.  Patient verbalized understanding of these instructions.       FOLLOW-UP Return in 3 weeks (on 12/21/2013) for Follow up INR at 4PM.  Hulen LusterJames Groce, III Pharm.D., CACP

## 2013-12-21 ENCOUNTER — Ambulatory Visit (INDEPENDENT_AMBULATORY_CARE_PROVIDER_SITE_OTHER): Payer: BC Managed Care – PPO

## 2013-12-21 DIAGNOSIS — Z7901 Long term (current) use of anticoagulants: Secondary | ICD-10-CM

## 2013-12-21 DIAGNOSIS — I2699 Other pulmonary embolism without acute cor pulmonale: Secondary | ICD-10-CM

## 2013-12-21 DIAGNOSIS — I80299 Phlebitis and thrombophlebitis of other deep vessels of unspecified lower extremity: Secondary | ICD-10-CM

## 2013-12-21 LAB — POCT INR: INR: 2.7

## 2013-12-28 ENCOUNTER — Telehealth: Payer: Self-pay | Admitting: *Deleted

## 2013-12-28 NOTE — Telephone Encounter (Signed)
Returned call to patient but no answer.  Message left to call clinic. I will give this report to Dr Alexandria LodgeGroce on Monday 5/11

## 2013-12-28 NOTE — Telephone Encounter (Signed)
Pt called stating labs drawn on 5/1 but he has not heard from us about dose of coumadin and when to return. INR was 2.7 on 5/1 and report called to Dr Alexandria LodgeGroce. Unable to reach Dr Alexandria LodgeGroce today, please advise.

## 2013-12-28 NOTE — Telephone Encounter (Signed)
The patient's INR goal is 2-3. He is within range at 2.7.  If I were to change anything, I would not change it unless I have a new more recent value.

## 2013-12-31 ENCOUNTER — Telehealth: Payer: Self-pay | Admitting: Pharmacist

## 2013-12-31 NOTE — Telephone Encounter (Signed)
Left message regarding INR collected 12-21-13 = 2.7 on 10mg  warfarin PO qd. Called patient and left message to CONTINUE this regimen of 2x5mg =10mg  warfarin PO QD and RTC on Friday 12-JUN-15 for repeat INR.

## 2014-01-17 ENCOUNTER — Encounter: Payer: Self-pay | Admitting: Internal Medicine

## 2014-02-01 ENCOUNTER — Ambulatory Visit (INDEPENDENT_AMBULATORY_CARE_PROVIDER_SITE_OTHER): Payer: BC Managed Care – PPO | Admitting: Pharmacist

## 2014-02-01 DIAGNOSIS — Z7901 Long term (current) use of anticoagulants: Secondary | ICD-10-CM

## 2014-02-01 DIAGNOSIS — I2699 Other pulmonary embolism without acute cor pulmonale: Secondary | ICD-10-CM

## 2014-02-01 DIAGNOSIS — I80299 Phlebitis and thrombophlebitis of other deep vessels of unspecified lower extremity: Secondary | ICD-10-CM

## 2014-02-01 LAB — POCT INR: INR: 1.7

## 2014-02-01 MED ORDER — WARFARIN SODIUM 5 MG PO TABS: 10.0000 mg | ORAL_TABLET | Freq: Every day | ORAL | Status: DC

## 2014-02-01 NOTE — Progress Notes (Signed)
Anti-Coagulation Progress Note  Richard [name] is a 38 y.o. male who is currently on an anti-coagulation regimen.    RECENT RESULTS: Recent results are below, the most recent result is correlated with a dose of 65 mg. per week: Lab Results  Component Value Date   INR 1.70 02/01/2014   INR 2.7 12/21/2013   INR 1.7 11/28/2013   PROTIME 20.4* 03/16/2011    ANTI-COAG DOSE: Anticoagulation Dose Instructions as of 02/01/2014     [name] Mon Tue Wed Thu Fri Sat   New Dose 10 mg 10 mg 10 mg 10 mg 10 mg 10 mg 10 mg       ANTICOAG SUMMARY: Anticoagulation Episode Summary   Current INR goal 2.0-3.0  Next INR check 03/01/2014  INR from last check 1.70! (02/01/2014)  Weekly max dose   Target end date Indefinite  [facility] check [facility]  Preferred lab   Send INR reminders to    Indications  Pulmonary embolism and infarction [415.19] D V T [451.19] Encounter for long-term (current) use of anticoagulants [V58.61]        Comments         ANTICOAG TODAY: Anticoagulation Summary as of 02/01/2014   INR goal 2.0-3.0  Selected INR 1.70! (02/01/2014)  Next INR check 03/01/2014  Target end date Indefinite   Indications  Pulmonary embolism and infarction [415.19] D V T [451.19] Encounter for long-term (current) use of anticoagulants [V58.61]      Anticoagulation Episode Summary   [facility] check location [facility]   Preferred lab    Send INR reminders to    Comments       PATIENT INSTRUCTIONS: Patient Instructions  Patient instructed to take medications as defined in the Anti-coagulation Track section of this encounter.  Patient instructed to take today's dose.  Patient verbalized understanding of these instructions.       FOLLOW-UP Return in 4 weeks (on 03/01/2014) for Follow up INR at 4PM.  Hulen LusterJames Groce, III Pharm.D., CACP

## 2014-02-01 NOTE — Patient Instructions (Signed)
Patient instructed to take medications as defined in the Anti-coagulation Track section of this encounter.  Patient instructed to take today's dose.  Patient verbalized understanding of these instructions.    

## 2014-03-01 ENCOUNTER — Ambulatory Visit (INDEPENDENT_AMBULATORY_CARE_PROVIDER_SITE_OTHER): Payer: BC Managed Care – PPO | Admitting: Pharmacist

## 2014-03-01 DIAGNOSIS — Z7901 Long term (current) use of anticoagulants: Secondary | ICD-10-CM

## 2014-03-01 DIAGNOSIS — I2699 Other pulmonary embolism without acute cor pulmonale: Secondary | ICD-10-CM

## 2014-03-01 DIAGNOSIS — I80299 Phlebitis and thrombophlebitis of other deep vessels of unspecified lower extremity: Secondary | ICD-10-CM

## 2014-03-01 LAB — POCT INR: INR: 4.7

## 2014-03-01 MED ORDER — WARFARIN SODIUM 5 MG PO TABS: ORAL_TABLET | ORAL | Status: DC

## 2014-03-01 NOTE — Progress Notes (Signed)
Anti-Coagulation Progress Note  Richard [name] is a 38 y.o. male who is currently on an anti-coagulation regimen.    RECENT RESULTS: Recent results are below, the most recent result is correlated with a dose of 70 mg. per week: Lab Results  Component Value Date   INR 4.70 03/01/2014   INR 1.70 02/01/2014   INR 2.7 12/21/2013   PROTIME 20.4* 03/16/2011    ANTI-COAG DOSE: Anticoagulation Dose Instructions as of 03/01/2014     [name] Mon Tue Wed Thu Fri Sat   New Dose 7.5 mg 10 mg 10 mg 7.5 mg 10 mg 10 mg 10 mg       ANTICOAG SUMMARY: Anticoagulation Episode Summary   Current INR goal 2.0-3.0  Next INR check 03/15/2014  INR from last check 4.70! (03/01/2014)  Weekly max dose   Target end date Indefinite  [facility] check [facility]  Preferred lab   Send INR reminders to    Indications  Pulmonary embolism and infarction [415.19] D V T [451.19] Encounter for long-term (current) use of anticoagulants [V58.61]        Comments         ANTICOAG TODAY: Anticoagulation Summary as of 03/01/2014   INR goal 2.0-3.0  Selected INR 4.70! (03/01/2014)  Next INR check 03/15/2014  Target end date Indefinite   Indications  Pulmonary embolism and infarction [415.19] D V T [451.19] Encounter for long-term (current) use of anticoagulants [V58.61]      Anticoagulation Episode Summary   [facility] check [facility]   Preferred lab    Send INR reminders to    Comments       PATIENT INSTRUCTIONS: Patient Instructions  Patient instructed to take medications as defined in the Anti-coagulation Track section of this encounter.  Patient instructed to OMIT/HOLD today's dose.  Patient verbalized understanding of these instructions.       FOLLOW-UP Return in 2 weeks (on 03/15/2014) for Follow up INR at 4PM.  Hulen LusterJames Kineta Fudala, III Pharm.D., CACP

## 2014-03-01 NOTE — Patient Instructions (Signed)
Patient instructed to take medications as defined in the Anti-coagulation Track section of this encounter.  Patient instructed to OMIT/HOLD today's dose.  Patient verbalized understanding of these instructions.    

## 2014-03-04 NOTE — Progress Notes (Signed)
Mr. Normand SloopDillard is on [facility] for DVT/PE.  His INR was elevated.  His coumadin was decreased.  I have reviewed Dr. Saralyn PilarGroce's note.

## 2014-03-15 ENCOUNTER — Ambulatory Visit (INDEPENDENT_AMBULATORY_CARE_PROVIDER_SITE_OTHER): Payer: BC Managed Care – PPO | Admitting: Pharmacist

## 2014-03-15 DIAGNOSIS — I2699 Other pulmonary embolism without acute cor pulmonale: Secondary | ICD-10-CM

## 2014-03-15 DIAGNOSIS — I80299 Phlebitis and thrombophlebitis of other deep vessels of unspecified lower extremity: Secondary | ICD-10-CM

## 2014-03-15 DIAGNOSIS — Z7901 Long term (current) use of anticoagulants: Secondary | ICD-10-CM

## 2014-03-15 LAB — POCT INR: INR: 3.4

## 2014-03-16 NOTE — Patient Instructions (Signed)
Patient instructed to take medications as defined in the Anti-coagulation Track section of this encounter.  Patient instructed to take today's dose.  Patient verbalized understanding of these instructions.    

## 2014-03-16 NOTE — Progress Notes (Signed)
Anti-Coagulation Progress Note  Richard [name] is a 38 y.o. male who is currently on an anti-coagulation regimen.    RECENT RESULTS: Recent results are below, the most recent result is correlated with a dose of 65 mg. per week: Lab Results  Component Value Date   INR 3.4 03/15/2014   INR 4.70 03/01/2014   INR 1.70 02/01/2014   PROTIME 20.4* 03/16/2011    ANTI-COAG DOSE: Anticoagulation Dose Instructions as of 03/15/2014     [name] Mon Tue Wed Thu Fri Sat   New Dose 10 mg 7.5 mg 10 mg 7.5 mg 10 mg 7.5 mg 10 mg       ANTICOAG SUMMARY: Anticoagulation Episode Summary   Current INR goal 2.0-3.0  Next INR check 04/05/2014  INR from last check 3.4! (03/15/2014)  Weekly max dose   Target end date Indefinite  [facility] check location [facility]  Preferred lab   Send INR reminders to    Indications  Pulmonary embolism and infarction [415.19] D V T [451.19] Encounter for long-term (current) use of anticoagulants [V58.61]        Comments         ANTICOAG TODAY: Anticoagulation Summary as of 03/15/2014   INR goal 2.0-3.0  Selected INR 3.4! (03/15/2014)  Next INR check 04/05/2014  Target end date Indefinite   Indications  Pulmonary embolism and infarction [415.19] D V T [451.19] Encounter for long-term (current) use of anticoagulants [V58.61]      Anticoagulation Episode Summary   [facility] check location [facility]   Preferred lab    Send INR reminders to    Comments       PATIENT INSTRUCTIONS: Patient Instructions  Patient instructed to take medications as defined in the Anti-coagulation Track section of this encounter.  Patient instructed to take today's dose.  Patient verbalized understanding of these instructions.       FOLLOW-UP Return in 3 weeks (on 04/05/2014) for Follow up INR at 4PM.  Hulen LusterJames Marquerite Forsman, III Pharm.D., CACP

## 2014-03-18 NOTE — Progress Notes (Signed)
INTERNAL MEDICINE TEACHING ATTENDING ADDENDUM - Nayshawn Mesta M.D  Duration- indefinite, Indication- PE, DVT, INR- supratherapeutic. Agree with Dr. Groce's recommendations as outlined in his note.  

## 2014-04-05 ENCOUNTER — Ambulatory Visit: Payer: BC Managed Care – PPO

## 2014-04-05 ENCOUNTER — Ambulatory Visit: Payer: BC Managed Care – PPO | Admitting: Internal Medicine

## 2014-04-12 ENCOUNTER — Other Ambulatory Visit: Payer: BC Managed Care – PPO

## 2014-04-12 ENCOUNTER — Ambulatory Visit (INDEPENDENT_AMBULATORY_CARE_PROVIDER_SITE_OTHER): Payer: BC Managed Care – PPO | Admitting: Pharmacist

## 2014-04-12 DIAGNOSIS — Z7901 Long term (current) use of anticoagulants: Secondary | ICD-10-CM | POA: Diagnosis not present

## 2014-04-12 LAB — POCT INR: INR: 4.7

## 2014-04-19 ENCOUNTER — Ambulatory Visit (INDEPENDENT_AMBULATORY_CARE_PROVIDER_SITE_OTHER): Payer: BC Managed Care – PPO | Admitting: Internal Medicine

## 2014-04-19 ENCOUNTER — Encounter: Payer: Self-pay | Admitting: Internal Medicine

## 2014-04-19 VITALS — BP 120/71 | HR 73 | Temp 97.4°F | Wt 204.1 lb

## 2014-04-19 DIAGNOSIS — N529 Male erectile dysfunction, unspecified: Secondary | ICD-10-CM | POA: Diagnosis not present

## 2014-04-19 DIAGNOSIS — Z7901 Long term (current) use of anticoagulants: Secondary | ICD-10-CM | POA: Diagnosis not present

## 2014-04-19 DIAGNOSIS — I2699 Other pulmonary embolism without acute cor pulmonale: Secondary | ICD-10-CM

## 2014-04-19 DIAGNOSIS — N528 Other male erectile dysfunction: Secondary | ICD-10-CM

## 2014-04-19 NOTE — Progress Notes (Signed)
Subjective:     Patient ID: Richard [name], male   DOB: [birth year], 38 y.o.   MRN: [id number]  HPI Pt is a 38 y/o male w/ PMH of PE's and tobacco abuse who presents to clinic for question regarding lifelong anticoagulation. Pt has been informed previously that he will need to be on lifelong coumadin 2/2 recurrent PE. He has his first PE in 2012 and then a recurrent PE in 2013 after being on and off coumadin. Denies any difficulty with taking coumadin or INR checks. His last INR check was done on 8/21 and was 4.7. His coumadin was adjusted to 1.5mg  daily for the supratherapeutic INR.   His girlfriend is in the room w/ him today and requesting help for his erectile dysfunction. Pt appears embarrassed to discuss this and his girlfriend in the room is answering the majority of questions for him. Pt has had difficulty getting erections since February along with difficulty with ejaculation. He reports occasional nighttime tumescence. Denies any illicit drug use including marijuana or steroids. The only medications he is on are coumadin and zyrtec.    Review of Systems  Respiratory: Negative for shortness of breath.   Cardiovascular: Negative for chest pain.  Hematological: Does not bruise/bleed easily.       Objective:   Physical Exam  Constitutional: He appears well-developed and well-nourished.  Cardiovascular: Normal rate and regular rhythm.   Pulmonary/Chest: Effort normal and breath sounds normal.  Abdominal: Soft. Bowel sounds are normal.       Assessment:     Please see problem based assessment and plan.        Plan:     Please see problem based assessment and plan.

## 2014-04-19 NOTE — Patient Instructions (Signed)
It was a pleasure meeting you today. Continue with your coumadin as directed. Like we discussed this will be a life long medicine for you.  Also, please schedule an appointment with our office at your convenience so that we can discuss erectile dysfunction. If you would feel more comfortable with a male physician to discuss this with please let the front desk know so they can schedule you with a male provider.    Erectile Dysfunction Erectile dysfunction is the inability to get or sustain a good enough erection to have sexual intercourse. Erectile dysfunction may involve:  Inability to get an erection.  Lack of enough hardness to allow penetration.  Loss of the erection before sex is finished.  Premature ejaculation. CAUSES  Certain drugs, such as:  Pain relievers.  Antihistamines.  Antidepressants.  Blood pressure medicines.  Water pills (diuretics).  Ulcer medicines.  Muscle relaxants.  Illegal drugs.  Excessive drinking.  Psychological causes, such as:  Anxiety.  Depression.  Sadness.  Exhaustion.  Performance fear.  Stress.  Physical causes, such as:  Artery problems. This may include diabetes, smoking, liver disease, or atherosclerosis.  High blood pressure.  Hormonal problems, such as low testosterone.  Obesity.  Nerve problems. This may include back or pelvic injuries, diabetes mellitus, multiple sclerosis, or Parkinson disease. SYMPTOMS  Inability to get an erection.  Lack of enough hardness to allow penetration.  Loss of the erection before sex is finished.  Premature ejaculation.  Normal erections at some times, but with frequent unsatisfactory episodes.  Orgasms that are not satisfactory in sensation or frequency.  Low sexual satisfaction in either partner because of erection problems.  A curved penis occurring with erection. The curve may cause pain or may be too curved to allow for intercourse.  Never having nighttime  erections. DIAGNOSIS Your caregiver can often diagnose this condition by:  Performing a physical exam to find other diseases or specific problems with the penis.  Asking you detailed questions about the problem.  Performing blood tests to check for diabetes mellitus or to measure hormone levels.  Performing urine tests to find other underlying health conditions.  Performing an ultrasound exam to check for scarring.  Performing a test to check blood flow to the penis.  Doing a sleep study at home to measure nighttime erections. TREATMENT   You may be prescribed medicines by mouth.  You may be given medicine injections into the penis.  You may be prescribed a vacuum pump with a ring.  Penile implant surgery may be performed. You may receive:  An inflatable implant.  A semirigid implant.  Blood vessel surgery may be performed. HOME CARE INSTRUCTIONS  If you are prescribed oral medicine, you should take the medicine as prescribed. Do not increase the dosage without first discussing it with your physician.  If you are using self-injections, be careful to avoid any veins that are on the surface of the penis. Apply pressure to the injection site for 5 minutes.  If you are using a vacuum pump, make sure you have read the instructions before using it. Discuss any questions with your physician before taking the pump home. SEEK MEDICAL CARE IF:  You experience pain that is not responsive to the pain medicine you have been prescribed.  You experience nausea or vomiting. SEEK IMMEDIATE MEDICAL CARE IF:   When taking oral or injectable medications, you experience an erection that lasts longer than 4 hours. If your physician is unavailable, go to the nearest emergency room for  evaluation. An erection that lasts much longer than 4 hours can result in permanent damage to your penis.  You have pain that is severe.  You develop redness, severe pain, or severe swelling of your  penis.  You have redness spreading up into your groin or lower abdomen.  You are unable to pass your urine. Document Released: 08/06/2000 Document Revised: 04/11/2013 Document Reviewed: 01/11/2013 [facility] Patient Information 2015 [place], [place]. This information is not intended to replace advice given to you by your health care provider. Make sure you discuss any questions you have with your health care provider.

## 2014-04-19 NOTE — Assessment & Plan Note (Addendum)
Pt reports having difficulty w/ erections and ejaculation since February. His girlfriend is in the room with him and pt seem unwilling to discuss this issue. His girlfriend was answering most of the questions for him, and is actually adamant that he get help soon.   - discussed with patient that the majority of ED is due to psychological reasons but that is a dx of exclusion. Informed him of erectile dysfunction evaluation including checking for inguinal lymph nodes, testicular volume, etc and pt would like to schedule a separate appointment for ED evaluation.  - informed patient he may ask to see a male provider if that would make him feel more comfortable - offered to have testosterone lab drawn today so that lab will be available during next appointment. Pt feeling frustrated and wants to get lab at another visit.  - will schedule a separate appointment to discuss this issue

## 2014-04-19 NOTE — Assessment & Plan Note (Signed)
Pt follows w/ Dr. Alexandria Lodge for INR checks and coumadin adjustments. He denies any trouble taking coumadin or getting INR checks. We discussed need for lifelong anticoagulation and patient has been informed before that he will be lifelong coumadin. He does not have any problems with being on lifelong coumadin. Currently taking 1.5mg  coumadin for INR of 4.7 on 8/21.

## 2014-04-23 NOTE — Progress Notes (Signed)
Internal Medicine Clinic Attending Date of visit: 04/19/2014  I saw and evaluated the patient.  I personally confirmed the key portions of the history and exam documented by Dr. Truong and I reviewed pertinent patient test results.  The assessment, diagnosis, and plan were formulated together and I agree with the documentation in the resident's note. 

## 2014-04-23 NOTE — Addendum Note (Signed)
Addended by: Debe Coder B on: 04/23/2014 09:25 AM   Modules accepted: Level of Service

## 2014-05-03 ENCOUNTER — Other Ambulatory Visit (INDEPENDENT_AMBULATORY_CARE_PROVIDER_SITE_OTHER): Payer: BC Managed Care – PPO

## 2014-05-03 ENCOUNTER — Ambulatory Visit (INDEPENDENT_AMBULATORY_CARE_PROVIDER_SITE_OTHER): Payer: BC Managed Care – PPO | Admitting: Pharmacist

## 2014-05-03 DIAGNOSIS — I2699 Other pulmonary embolism without acute cor pulmonale: Secondary | ICD-10-CM | POA: Diagnosis not present

## 2014-05-03 DIAGNOSIS — I80299 Phlebitis and thrombophlebitis of other deep vessels of unspecified lower extremity: Secondary | ICD-10-CM

## 2014-05-03 DIAGNOSIS — Z7901 Long term (current) use of anticoagulants: Secondary | ICD-10-CM

## 2014-05-03 LAB — POCT INR: INR: 2.5

## 2014-05-03 NOTE — Patient Instructions (Signed)
Patient instructed to take medications as defined in the Anti-coagulation Track section of this encounter.  Patient instructed to take today's dose.  Patient verbalized understanding of these instructions.    

## 2014-05-03 NOTE — Progress Notes (Signed)
Anti-Coagulation Progress Note  Richard [name] is a 38 y.o. male who is currently on an anti-coagulation regimen.    RECENT RESULTS: Recent results are below, the most recent result is correlated with a dose of 62.5 mg. per week: Lab Results  Component Value Date   INR 2.5 05/03/2014   INR 4.7 04/12/2014   INR 3.4 03/15/2014   PROTIME 20.4* 03/16/2011    ANTI-COAG DOSE: Anticoagulation Dose Instructions as of 05/03/2014     [name] Tue Wed Thu Fri Sat   New Dose 10 mg 7.5 mg 10 mg 7.5 mg 10 mg 7.5 mg 10 mg       ANTICOAG SUMMARY: Anticoagulation Episode Summary   Current INR goal 2.0-3.0  Next INR check 06/07/2014  INR from last check 2.5 (05/03/2014)  Weekly max dose   Target end date Indefinite  [facility]  Preferred lab   Send INR reminders to    Indications  Pulmonary embolism and infarction [415.19] D V T [451.19] Encounter for long-term (current) use of anticoagulants [V58.61]        Comments         ANTICOAG TODAY: Anticoagulation Summary as of 05/03/2014   INR goal 2.0-3.0  Selected INR 2.5 (05/03/2014)  Next INR check 06/07/2014  Target end date Indefinite   Indications  Pulmonary embolism and infarction [415.19] D V T [451.19] Encounter for long-term (current) use of anticoagulants [V58.61]      Anticoagulation Episode Summary   [facility]   Preferred lab    Send INR reminders to    Comments       PATIENT INSTRUCTIONS: Patient Instructions  Patient instructed to take medications as defined in the Anti-coagulation Track section of this encounter.  Patient instructed to take today's dose.  Patient verbalized understanding of these instructions.       FOLLOW-UP Return in 5 weeks (on 06/07/2014) for Follow up INR at 4:30PM.  [name], III Pharm.D., CACP

## 2014-05-08 ENCOUNTER — Ambulatory Visit (INDEPENDENT_AMBULATORY_CARE_PROVIDER_SITE_OTHER): Payer: BC Managed Care – PPO | Admitting: Internal Medicine

## 2014-05-08 ENCOUNTER — Encounter: Payer: Self-pay | Admitting: Internal Medicine

## 2014-05-08 VITALS — BP 120/73 | HR 78 | Temp 98.0°F | Wt 202.0 lb

## 2014-05-08 DIAGNOSIS — N529 Male erectile dysfunction, unspecified: Secondary | ICD-10-CM

## 2014-05-08 DIAGNOSIS — N528 Other male erectile dysfunction: Secondary | ICD-10-CM

## 2014-05-08 NOTE — Progress Notes (Signed)
Subjective:     Patient ID: Richard [name], male   DOB: 11/29/1975, 38 y.o.   MRN: [id number]  HPI Pt is a 38 y/o male w/ PMHx of recurrent PE who presents to clinic for erectile dysfunction. Pt states that he is unable to get an erection when he wants to. He reports this has been going on around 6 months. He has nocturnal erections and is able to ejaculate. He denies hx of STDs, last STD check was last year which was negative per patient. He denies genitourinary birth issues. Denies stress, depression, and guilt. He is emotionally satisfied w/ his relationship w/ his girlfriend. His only medications are coumadin and zyrtec. IIEF-5 questionnaire score was 9 out of 25 reflecting moderate ED. Denies any cardiac history, discharge from nipples, illicit drug use, and decreased vision.    Review of Systems  Constitutional: Negative for fever.  Eyes: Negative for visual disturbance.  Genitourinary: Negative for discharge, scrotal swelling and genital sores.  Psychiatric/Behavioral: Negative for dysphoric mood.       Objective:   Physical Exam  Constitutional: He appears well-developed and well-nourished.  Cardiovascular: Normal rate and regular rhythm.   Pulmonary/Chest: Effort normal and breath sounds normal.  Abdominal: Soft. Bowel sounds are normal.  Genitourinary:  Testicular volume equal, negative for varicocele, no lesions, hair pattern unable to assess 2/2 to patient shaving, rt side femoral pulse 2+, unable to palpate lt side femoral pulse, 2+ dorsalis pedis pulse b/l, negative cremasteric reflex b/l  Musculoskeletal: He exhibits no edema.       Assessment:     Please see problem based assessment and plan.        Plan:     Please see problem based assessment and plan.

## 2014-05-08 NOTE — Progress Notes (Signed)
INTERNAL MEDICINE TEACHING ATTENDING ADDENDUM - Earl Lagos, MD: I personally saw and evaluated Mr. Aughenbaugh in this clinic visit in conjunction with the resident, Dr. Danella Penton. I have discussed patient's plan of care with medical resident during this visit. I have confirmed the physical exam findings and have read and agree with the clinic note including the plan with the following addition: - Pt with difficulty maintaining and also achieving erections - denies any drug use, no medical conditions contributing to this - Possibly psychogenic but will need further w/u - check prolactin, TSH and testosterone levels - refer to uro for further w/u

## 2014-05-08 NOTE — Assessment & Plan Note (Signed)
Pt reports having erectile dysfunction however he is able to have nocturnal erections. He is able to stimulate himself to get an erection. He does report on the IIEF-5 questionnaire that he almost never or never is able to maintain an erection after penetrating his partner. He is only on 2 medications that do not have sexual dysfunction side effects (coumadin and zyrtec). Likely erectile dysfunction is situational.   - prolactin, TSH, and testosterone labs today - referral to urology for further work up.

## 2014-05-09 LAB — TESTOSTERONE: Testosterone: 311 ng/dL (ref 300–890)

## 2014-05-09 LAB — TSH: TSH: 0.909 u[IU]/mL (ref 0.350–4.500)

## 2014-05-09 LAB — PROLACTIN: Prolactin: 7.5 ng/mL (ref 2.1–17.1)

## 2014-05-16 ENCOUNTER — Encounter: Payer: Self-pay | Admitting: Internal Medicine

## 2014-05-16 ENCOUNTER — Ambulatory Visit (INDEPENDENT_AMBULATORY_CARE_PROVIDER_SITE_OTHER): Payer: BC Managed Care – PPO | Admitting: Internal Medicine

## 2014-05-16 ENCOUNTER — Telehealth: Payer: Self-pay | Admitting: *Deleted

## 2014-05-16 VITALS — BP 129/73 | HR 83 | Temp 97.6°F | Ht 72.0 in | Wt 204.4 lb

## 2014-05-16 DIAGNOSIS — J069 Acute upper respiratory infection, unspecified: Secondary | ICD-10-CM | POA: Diagnosis not present

## 2014-05-16 MED ORDER — GUAIFENESIN-CODEINE 400-10 MG PO TABS: 1.0000 | ORAL_TABLET | Freq: Four times a day (QID) | ORAL | Status: DC | PRN

## 2014-05-16 NOTE — Patient Instructions (Signed)
General Instructions: We will be prescribing a medication to help with your Cold. You can take one tablet 4 times a day.  If bleeding gets worse, come back in immediately to see [place].  For now continue with your coumadin, and have your INR checked as previously scheduled.  If your cough is not better in 2-3 weeks come back and see [place].  It was nice meeting you today.   Please bring your medicines with you each time you come to clinic.  Medicines may include prescription medications, over-the-counter medications, herbal remedies, eye drops, vitamins, or other pills.  Upper Respiratory Infection, Adult An upper respiratory infection (URI) is also sometimes known as the common cold. The upper respiratory tract includes the nose, sinuses, throat, trachea, and bronchi. Bronchi are the airways leading to the lungs. Most people improve within 1 week, but symptoms can last up to 2 weeks. A residual cough may last even longer.  CAUSES Many different viruses can infect the tissues lining the upper respiratory tract. The tissues become irritated and inflamed and often become very moist. Mucus production is also common. A cold is contagious. You can easily spread the virus to others by oral contact. This includes kissing, sharing a glass, coughing, or sneezing. Touching your mouth or nose and then touching a surface, which is then touched by another person, can also spread the virus. SYMPTOMS  Symptoms typically develop 1 to 3 days after you come in contact with a cold virus. Symptoms vary from person to person. They may include:  Runny nose.  Sneezing.  Nasal congestion.  Sinus irritation.  Sore throat.  Loss of voice (laryngitis).  Cough.  Fatigue.  Muscle aches.  Loss of appetite.  Headache.  Low-grade fever. DIAGNOSIS  You might diagnose your own cold based on familiar symptoms, since most people get a cold 2 to 3 times a year. Your caregiver can confirm this based on your exam. Most  importantly, your caregiver can check that your symptoms are not due to another disease such as strep throat, sinusitis, pneumonia, asthma, or epiglottitis. Blood tests, throat tests, and X-rays are not necessary to diagnose a common cold, but they may sometimes be helpful in excluding other more serious diseases. Your caregiver will decide if any further tests are required. RISKS AND COMPLICATIONS  You may be at risk for a more severe case of the common cold if you smoke cigarettes, have chronic heart disease (such as heart failure) or lung disease (such as asthma), or if you have a weakened immune system. The very young and very old are also at risk for more serious infections. Bacterial sinusitis, middle ear infections, and bacterial pneumonia can complicate the common cold. The common cold can worsen asthma and chronic obstructive pulmonary disease (COPD). Sometimes, these complications can require emergency medical care and may be life-threatening. PREVENTION  The best way to protect against getting a cold is to practice good hygiene. Avoid oral or hand contact with people with cold symptoms. Wash your hands often if contact occurs. There is no clear evidence that vitamin C, vitamin E, echinacea, or exercise reduces the chance of developing a cold. However, it is always recommended to get plenty of rest and practice good nutrition. TREATMENT  Treatment is directed at relieving symptoms. There is no cure. Antibiotics are not effective, because the infection is caused by a virus, not by bacteria. Treatment may include:  Increased fluid intake. Sports drinks offer valuable electrolytes, sugars, and fluids.  Breathing heated mist  or steam (vaporizer or shower).  Eating chicken soup or other clear broths, and maintaining good nutrition.  Getting plenty of rest.  Using gargles or lozenges for comfort.  Controlling fevers with ibuprofen or acetaminophen as directed by your caregiver.  Increasing  usage of your inhaler if you have asthma. Zinc gel and zinc lozenges, taken in the first 24 hours of the common cold, can shorten the duration and lessen the severity of symptoms. Pain medicines may help with fever, muscle aches, and throat pain. A variety of non-prescription medicines are available to treat congestion and runny nose. Your caregiver can make recommendations and may suggest nasal or lung inhalers for other symptoms.  HOME CARE INSTRUCTIONS   Only take over-the-counter or prescription medicines for pain, discomfort, or fever as directed by your caregiver.  Use a warm mist humidifier or inhale steam from a shower to increase air moisture. This may keep secretions moist and make it easier to breathe.  Drink enough water and fluids to keep your urine clear or pale yellow.  Rest as needed.  Return to work when your temperature has returned to normal or as your caregiver advises. You may need to stay home longer to avoid infecting others. You can also use a face mask and careful hand washing to prevent spread of the virus. SEEK MEDICAL CARE IF:   After the first few days, you feel you are getting worse rather than better.  You need your caregiver's advice about medicines to control symptoms.  You develop chills, worsening shortness of breath, or brown or red sputum. These may be signs of pneumonia.  You develop yellow or brown nasal discharge or pain in the face, especially when you bend forward. These may be signs of sinusitis.  You develop a fever, swollen neck glands, pain with swallowing, or white areas in the back of your throat. These may be signs of strep throat. SEEK IMMEDIATE MEDICAL CARE IF:   You have a fever.  You develop severe or persistent headache, ear pain, sinus pain, or chest pain.  You develop wheezing, a prolonged cough, cough up blood, or have a change in your usual mucus (if you have chronic lung disease).  You develop sore muscles or a stiff  neck.

## 2014-05-16 NOTE — Telephone Encounter (Signed)
Pt presents at front desk c/o cough with speckled blood in it, scant amts since Saturday, worried, desires to be seen, appt [phone] dr Mariea Clonts

## 2014-05-16 NOTE — Progress Notes (Signed)
Patient ID: Richard [name], male   DOB: 1975/11/05, 38 y.o.   MRN: [id number]   Subjective:   Patient ID: Richard [name] male   DOB: 09/30/1975 38 y.o.   MRN: [id number]  HPI: Richard [name] is a 38 y.o. with PMH of Pe and DVT on chronic anticoag- warfarin. Presented today with complaints of cough thaty started over the weekend- 4 days ago. Initialy productive of clear sputum and this morning was greenish with speckles of blood. Pt has associated nasal congestion, and initial sorethroat that has resolved. Pt has tried several over the counter products without relief- Alka-seltza, tylenol-cold, nyquill, teraflu. Pt had just the one episode of speckles of blood. No blood in stools, no nose bleeds, no blood in urine. No fever, no SOB. Pt otherside feels fine. No chest pain or leg swelling. No dizziness. Compalints of some mild redness in his eye this morning- Left eye, with mild crusting this morning that has cleared. No eye pain. Discharge from eye not purulent, was clear. Right eye without symptoms.  Pt last had his INR checked- 05/03/2014, he is on warfarin, which he has been compliant with.  Past Medical History  Diagnosis Date  . DVT (deep venous thrombosis)   . Seasonal allergies    Current Outpatient Prescriptions  Medication Sig Dispense Refill  . cetirizine (ZYRTEC) 10 MG tablet Take 1 tablet (10 mg total) by mouth daily.  100 tablet  0  . warfarin (COUMADIN) 5 MG tablet Take 1&1/2 tablets on Sundays and Wednesdays; 2 tablets all other days of week.  50 tablet  2   No current facility-administered medications for this visit.   Family History  Problem Relation Age of Onset  . Other Mother   . Other Father   . Pulmonary embolism Maternal Uncle    History   Social History  . Marital Status: Single    Spouse Name: N/A    Number of Children: N/A  . Years of Education: N/A   Social History Main Topics  . Smoking status: Former Smoker -- 1 years    Types: Cigarettes, Cigars   Quit date: 04/05/2012  . Smokeless tobacco: None  . Alcohol Use: Yes  . Drug Use: No  . Sexual Activity: None   Other Topics Concern  . None   Social History Narrative  . None   Review of Systems: CONSTITUTIONAL- No Fever, weightloss, night sweat or change in appetite. SKIN- No Rash, colour changes or itching. HEAD- No Headache or dizziness. EYES-  No Vision loss, pain, redness, double or blurred vision. CARDIAC- No Palpitations, DOE, PND or chest pain. GI- No nausea, vomiting, diarrhoea, constipation, abd pain. URINARY- No Frequency, urgency, straining or dysuria. NEUROLOGIC- No Numbness, syncope, seizures or burning. [facility]- Denies depression or anxiety.  Objective:  Physical Exam: Filed Vitals:   05/16/14 1004  BP: 129/73  Pulse: 83  Temp: 97.6 F (36.4 C)  TempSrc: Oral  Height: 6' (1.829 m)  Weight: 204 lb 6.4 oz (92.715 kg)  SpO2: 98%   GENERAL- alert, pleasant, co-operative, appears as stated age, not in any distress. HEENT- Atraumatic, normocephalic, PERRL, EOMI, oral mucosa appears moist, no pharyngeal erythema, no cervical LN enlargement, no obvious bleeding, neck supple. CARDIAC- RRR, no murmurs, rubs or gallops. RESP- Moving equal volumes of air, and clear to auscultation bilaterally, no wheezes or crackles. ABDOMEN- Soft, nontender, no guarding or rebound, no palpable masses or organomegaly, bowel sounds present. BACK- Normal curvature of the spine, No tenderness along the vertebrae, no CVA  tenderness. NEURO- No obvious Cr N abnormality, strenght upper and lower extremities- 5/5,  Gait- Normal. EXTREMITIES- pulse 2+, symmetric, no pedal edema. SKIN- Warm, dry, No rash or lesion. PSYCH- Normal mood and affect, appropriate thought content and speech.  Assessment & Plan:   The patient's case and plan of care was discussed with attending physician, Dr. Cyndie Chime.  Upper respiratory tract infection- Sore throat, Cough, congestion, and mild conjunctivitis  also present- 4 days.Marland Kitchen Appears viral. Symptomatic treatment for now. Most likely bleeding due to cough, irritation or respiratory tract. - Pt encouraged to continue his coumadin and if bleeding worsens, to come back to clinic immediately. - Guaifenasin-Codeine- 400-10mg  Tabs Q6H. - Also warned if drainage form eye is purulent or eye is painful to come back or go to ED.

## 2014-05-16 NOTE — Progress Notes (Signed)
Attending Medicine: Medical history, presenting complaints, physical findings, and medications reviewed with resident physician Dr Mariea Clonts and I concur with her evaluation and management plan, Cephas Darby, MD, FACP  Hematology-Oncology/Internal Medicine

## 2014-06-07 ENCOUNTER — Ambulatory Visit: Payer: BC Managed Care – PPO

## 2014-06-07 DIAGNOSIS — I2699 Other pulmonary embolism without acute cor pulmonale: Secondary | ICD-10-CM

## 2014-06-14 NOTE — Addendum Note (Signed)
Addended by: Neomia DearPOWERS, Keithon Mccoin E on: 06/14/2014 05:22 PM   Modules accepted: Orders

## 2014-06-21 ENCOUNTER — Ambulatory Visit (INDEPENDENT_AMBULATORY_CARE_PROVIDER_SITE_OTHER): Payer: BC Managed Care – PPO | Admitting: Pharmacist

## 2014-06-21 ENCOUNTER — Other Ambulatory Visit: Payer: BC Managed Care – PPO

## 2014-06-21 DIAGNOSIS — I2699 Other pulmonary embolism without acute cor pulmonale: Secondary | ICD-10-CM | POA: Diagnosis not present

## 2014-06-21 LAB — POCT INR: INR: 2.8

## 2014-06-21 NOTE — Patient Instructions (Signed)
Patient instructed to take medications as defined in the Anti-coagulation Track section of this encounter.  Patient instructed to take today's dose.  Patient verbalized understanding of these instructions.    

## 2014-06-21 NOTE — Progress Notes (Signed)
Anti-Coagulation Progress Note  Berneice GandyMicha Heyne is a 38 y.o. male who reports to the clinic for monitoring of anticoagulation treatment.    RECENT RESULTS: Recent results are below, the most recent result is correlated with a dose of 57.5 mg. per week: Lab Results  Component Value Date   INR 2.8 06/21/2014   INR 2.5 05/03/2014   INR 4.7 04/12/2014   PROTIME 20.4* 03/16/2011    Weekly dose was unchanged  ANTI-COAG DOSE: INR as of 06/21/2014 and Previous Dosing Information   IN[facility]t [facility] [name]   06/21/2014 2.8 2.0-3.0 57.5 mg 7.5 mg 7.5 mg 10 mg 7.5 mg 10 mg 7.5 mg 7.5 mg    Anticoagulation Dose Instructions as of 06/21/2014     Total [name]   New Dose 57.5 mg 7.5 mg 7.5 mg 10 mg 7.5 mg 10 mg 7.5 mg 7.5 mg     (5 mg x 1.5)  (5 mg x 1.5)  (5 mg x 2)  (5 mg x 1.5)  (5 mg x 2)  (5 mg x 1.5)  (5 mg x 1.5)     ANTICOAG SUMMARY: Anticoagulation Episode Summary   Current INR goal 2.0-3.0  Next INR check 07/12/2014  INR from last check 2.8 (06/21/2014)  Weekly max dose   Target end date Indefinite  [facility] check [facility]  Preferred lab   Send INR reminders to    Indications  Pulmonary embolism and infarction [I26.99] D V T [I80.299] Encounter for long-term (current) use of anticoagulants [Z79.01]        Comments        PATIENT INSTRUCTIONS: Patient Instructions  Patient instructed to take medications as defined in the Anti-coagulation Track section of this encounter.  Patient instructed to take today's dose.  Patient verbalized understanding of these instructions.       FOLLOW-UP 07/12/14 (3 weeks)  Marzetta BoardJennifer Arzell Mcgeehan, PharmD BCPS, BCACP

## 2014-06-26 NOTE — Progress Notes (Signed)
Indication: Recurrent venous thromboembolism. Duration: Lifelong. INR: At target. Agree with Dr. Groce's assessment and plan. 

## 2014-07-12 ENCOUNTER — Ambulatory Visit (INDEPENDENT_AMBULATORY_CARE_PROVIDER_SITE_OTHER): Payer: BC Managed Care – PPO | Admitting: Pharmacist

## 2014-07-12 DIAGNOSIS — Z7901 Long term (current) use of anticoagulants: Secondary | ICD-10-CM

## 2014-07-12 DIAGNOSIS — I80299 Phlebitis and thrombophlebitis of other deep vessels of unspecified lower extremity: Secondary | ICD-10-CM | POA: Diagnosis not present

## 2014-07-12 DIAGNOSIS — I2699 Other pulmonary embolism without acute cor pulmonale: Secondary | ICD-10-CM

## 2014-07-12 LAB — POCT INR: INR: 1.5

## 2014-07-13 NOTE — Patient Instructions (Signed)
Patient instructed to take medications as defined in the Anti-coagulation Track section of this encounter.  Patient instructed to take today's dose.  Patient verbalized understanding of these instructions.    

## 2014-07-13 NOTE — Progress Notes (Signed)
Anti-Coagulation Progress Note  Richard [name] is a 38 y.o. male who reports to the clinic for monitoring of anticoagulation treatment.    RECENT RESULTS: Recent results are below, the most recent result is correlated with a dose of 57.5 mg. per week: Lab Results  Component Value Date   INR 1.5 07/12/2014   INR 2.8 06/21/2014   INR 2.5 05/03/2014   PROTIME 20.4* 03/16/2011    Weekly dose was unchanged due to patient reporting missing 1 or more doses in the past week.  ANTI-COAG DOSE: INR as of 07/12/2014 and Previous Dosing Information    [name]   07/12/2014 1.5 2.0-3.0 57.5 mg 7.5 mg 7.5 mg 10 mg 7.5 mg 10 mg 7.5 mg 7.5 mg   Patient deviated from recommended dosing.       Anticoagulation Dose Instructions as of 07/12/2014      Total [name]   New Dose 57.5 mg 7.5 mg 7.5 mg 10 mg 7.5 mg 10 mg 7.5 mg 7.5 mg     (5 mg x 1.5)  (5 mg x 1.5)  (5 mg x 2)  (5 mg x 1.5)  (5 mg x 2)  (5 mg x 1.5)  (5 mg x 1.5)                         Description        Reinforced education on importance of adherence. Continue same dose and return in 2 weeks.      ANTICOAG SUMMARY: Anticoagulation Episode Summary    Current INR goal 2.0-3.0  Next INR check 07/26/2014  INR from last check 1.5! (07/12/2014)  Weekly max dose   Target end date Indefinite  [facility]  Preferred lab   Send INR reminders to    Indications  Pulmonary embolism and infarction [I26.99] D V T [I80.299] Encounter for long-term (current) use of anticoagulants [Z79.01]        Comments        PATIENT INSTRUCTIONS: Patient Instructions  Patient instructed to take medications as defined in the Anti-coagulation Track section of this encounter.  Patient instructed to take today's dose.  Patient verbalized understanding of these instructions.  Reinforced the importance of medication adherence   FOLLOW-UP 2 weeks  Marzetta BoardJennifer  Kim, PharmD BCPS, BCACP

## 2014-07-17 ENCOUNTER — Other Ambulatory Visit: Payer: Self-pay | Admitting: *Deleted

## 2014-07-17 MED ORDER — WARFARIN SODIUM 5 MG PO TABS: ORAL_TABLET | ORAL | Status: DC

## 2014-07-24 ENCOUNTER — Ambulatory Visit (INDEPENDENT_AMBULATORY_CARE_PROVIDER_SITE_OTHER): Payer: BC Managed Care – PPO | Admitting: Pharmacist

## 2014-07-24 DIAGNOSIS — Z7901 Long term (current) use of anticoagulants: Secondary | ICD-10-CM

## 2014-07-24 DIAGNOSIS — I2699 Other pulmonary embolism without acute cor pulmonale: Secondary | ICD-10-CM | POA: Diagnosis not present

## 2014-07-24 LAB — POCT INR: INR: 1.9

## 2014-07-24 NOTE — Patient Instructions (Signed)
Patient instructed to take medications as defined in the Anti-coagulation Track section of this encounter.  Patient instructed to take today's dose.  Patient verbalized understanding of these instructions.    

## 2014-07-24 NOTE — Progress Notes (Signed)
Anti-Coagulation Progress Note  Berneice GandyMicha Borgwardt is a 38 y.o. male who reports to the clinic for monitoring of anticoagulation treatment.    RECENT RESULTS: Recent results are below, the most recent result is correlated with a dose of 57.5 mg. per week: Lab Results  Component Value Date   INR 1.9 07/24/2014   INR 1.5 07/12/2014   INR 2.8 06/21/2014   PROTIME 20.4* 03/16/2011    Weekly dose was unchanged.  ANTI-COAG DOSE: INR as of 07/24/2014 and Previous Dosing Information    INR Dt INR Goal [facility] Tot [name]   07/24/2014 1.9 2.0-3.0 57.5 mg 7.5 mg 7.5 mg 10 mg 7.5 mg 10 mg 7.5 mg 7.5 mg    Previous description        Reinforced education on importance of adherence. Continue same dose and return in 2 weeks.    Anticoagulation Dose Instructions as of 07/24/2014      Total [name]   New Dose 57.5 mg 7.5 mg 7.5 mg 10 mg 7.5 mg 10 mg 7.5 mg 7.5 mg     (5 mg x 1.5)  (5 mg x 1.5)  (5 mg x 2)  (5 mg x 1.5)  (5 mg x 2)  (5 mg x 1.5)  (5 mg x 1.5)                         Description        Reinforced education on importance of adherence. Continue same dose and return in 2 weeks.      ANTICOAG SUMMARY: Anticoagulation Episode Summary    Current INR goal 2.0-3.0  Next INR check 08/07/2014  INR from last check 1.9! (07/24/2014)  Weekly max dose   Target end date Indefinite  [facility]  Preferred lab   Send INR reminders to    Indications  Pulmonary embolism and infarction [I26.99] D V T [I80.299] Encounter for long-term (current) use of anticoagulants [Z79.01]        Comments        PATIENT INSTRUCTIONS: Patient Instructions  Patient instructed to take medications as defined in the Anti-coagulation Track section of this encounter.  Patient instructed to take today's dose.  Patient verbalized understanding of these instructions.      FOLLOW-UP No Follow-up on file.  Marzetta BoardJennifer Kim, PharmD BCPS,  BCACP

## 2014-07-25 MED ORDER — WARFARIN SODIUM 5 MG PO TABS: ORAL_TABLET | ORAL | Status: DC

## 2014-07-26 NOTE — Addendum Note (Signed)
Addended by: Bufford SpikesFULCHER, Jaxsyn Catalfamo N on: 07/26/2014 12:32 PM   Modules accepted: Orders

## 2014-07-26 NOTE — Progress Notes (Signed)
INTERNAL MEDICINE TEACHING ATTENDING ADDENDUM - Earl LagosNischal Devony Mcgrady M.D  Duration- indefinite, Indication- PE, DVT, INR- subtherapeutic. Agree with Dr. Elmyra RicksKim's recommendations as outlined in her note.

## 2014-08-07 ENCOUNTER — Ambulatory Visit: Payer: BC Managed Care – PPO

## 2014-08-09 ENCOUNTER — Ambulatory Visit (INDEPENDENT_AMBULATORY_CARE_PROVIDER_SITE_OTHER): Payer: BC Managed Care – PPO | Admitting: Pharmacist

## 2014-08-09 DIAGNOSIS — Z7901 Long term (current) use of anticoagulants: Secondary | ICD-10-CM

## 2014-08-09 DIAGNOSIS — I2699 Other pulmonary embolism without acute cor pulmonale: Secondary | ICD-10-CM

## 2014-08-09 DIAGNOSIS — I80299 Phlebitis and thrombophlebitis of other deep vessels of unspecified lower extremity: Secondary | ICD-10-CM

## 2014-08-09 LAB — POCT INR: INR: 2

## 2014-08-09 NOTE — Progress Notes (Signed)
Anti-Coagulation Progress Note  Richard [name] is a 38 y.o. male who reports to the clinic for monitoring of anticoagulation treatment.    RECENT RESULTS: Recent results are below, the most recent result is correlated with a dose of 57.5 mg. per week: Lab Results  Component Value Date   INR 2.0 08/09/2014   INR 1.9 07/24/2014   INR 1.5 07/12/2014   PROTIME 20.4* 03/16/2011    Weekly dose was unchanged  ANTI-COAG DOSE: INR as of 08/09/2014 and Previous Dosing Information    [name]   08/09/2014 2.0 2.0-3.0 57.5 mg 7.5 mg 7.5 mg 10 mg 7.5 mg 10 mg 7.5 mg 7.5 mg    Previous description        Reinforced education on importance of adherence. Continue same dose and return in 2 weeks.    Anticoagulation Dose Instructions as of 08/09/2014      Total [name]   New Dose 57.5 mg 7.5 mg 7.5 mg 10 mg 7.5 mg 10 mg 7.5 mg 7.5 mg     (5 mg x 1.5)  (5 mg x 1.5)  (5 mg x 2)  (5 mg x 1.5)  (5 mg x 2)  (5 mg x 1.5)  (5 mg x 1.5)                           ANTICOAG SUMMARY: Anticoagulation Episode Summary    Current INR goal 2.0-3.0  Next INR check 09/06/2014  INR from last check 2.0 (08/09/2014)  Weekly max dose   Target end date Indefinite  [facility] check location [facility]  Preferred lab   Send INR reminders to    Indications  Pulmonary embolism and infarction [I26.99] D V T [I80.299] Encounter for long-term (current) use of anticoagulants [Z79.01]        Comments        PATIENT INSTRUCTIONS: Patient Instructions  Patient instructed to take medications as defined in the Anti-coagulation Track section of this encounter.  Patient instructed to continue today's dose.  Patient verbalized understanding of these instructions.      FOLLOW-UP 4 weeks  Marzetta BoardJennifer Hiram Mciver, PharmD BCPS, BCACP

## 2014-08-09 NOTE — Patient Instructions (Signed)
Patient instructed to take medications as defined in the Anti-coagulation Track section of this encounter.  Patient instructed to continue today's dose.  Patient verbalized understanding of these instructions.    

## 2014-09-06 ENCOUNTER — Ambulatory Visit (INDEPENDENT_AMBULATORY_CARE_PROVIDER_SITE_OTHER): Payer: Self-pay | Admitting: Pharmacist

## 2014-09-06 DIAGNOSIS — I2699 Other pulmonary embolism without acute cor pulmonale: Secondary | ICD-10-CM

## 2014-09-06 LAB — POCT INR: INR: 2.5

## 2014-09-06 MED ORDER — WARFARIN SODIUM 5 MG PO TABS: ORAL_TABLET | ORAL | Status: DC

## 2014-09-06 NOTE — Patient Instructions (Signed)
Patient instructed to take medications as defined in the Anti-coagulation Track section of this encounter.  Patient instructed to take today's dose.  Patient verbalized understanding of these instructions.    

## 2014-09-06 NOTE — Progress Notes (Signed)
Anti-Coagulation Progress Note  Berneice GandyMicha Dinino is a 39 y.o. male who is currently on an anti-coagulation regimen.    RECENT RESULTS: Recent results are below, the most recent result is correlated with a dose of 57.5 mg. per week: Lab Results  Component Value Date   INR 2.50 09/06/2014   INR 2.0 08/09/2014   INR 1.9 07/24/2014   PROTIME 20.4* 03/16/2011    ANTI-COAG DOSE: Anticoagulation Dose Instructions as of 09/06/2014      [name] Mon Tue Wed Thu Fri Sat   New Dose 7.5 mg 7.5 mg 10 mg 7.5 mg 10 mg 7.5 mg 7.5 mg       ANTICOAG SUMMARY: Anticoagulation Episode Summary    Current INR goal 2.0-3.0  Next INR check 10/08/2014  INR from last check 2.50 (09/06/2014)  Weekly max dose   Target end date Indefinite  [facility]  Preferred lab   Send INR reminders to    Indications  Pulmonary embolism and infarction [I26.99] D V T [I80.299] Encounter for long-term (current) use of anticoagulants [Z79.01]        Comments         ANTICOAG TODAY: Anticoagulation Summary as of 09/06/2014    INR goal 2.0-3.0  Selected INR 2.50 (09/06/2014)  Next INR check 10/08/2014  Target end date Indefinite   Indications  Pulmonary embolism and infarction [I26.99] D V T [I80.299] Encounter for long-term (current) use of anticoagulants [Z79.01]      Anticoagulation Episode Summary    [facility] check [facility]   Preferred lab    Send INR reminders to    Comments       PATIENT INSTRUCTIONS: Patient Instructions  Patient instructed to take medications as defined in the Anti-coagulation Track section of this encounter.  Patient instructed to take today's dose.  Patient verbalized understanding of these instructions.       FOLLOW-UP Return in 5 weeks (on 10/08/2014) for Follow up INR at 4PM.  Hulen LusterJames Dominigue Gellner, III Pharm.D., CACP

## 2014-09-11 NOTE — Progress Notes (Signed)
INTERNAL MEDICINE TEACHING ATTENDING ADDENDUM - Deontra Pereyra M.D  Duration- indefinite, Indication- PE, DVT, INR- therapeutic. Agree with Dr. Groce's recommendations as outlined in his note.      

## 2014-10-08 ENCOUNTER — Ambulatory Visit (INDEPENDENT_AMBULATORY_CARE_PROVIDER_SITE_OTHER): Payer: BLUE CROSS/BLUE SHIELD | Admitting: Pharmacist

## 2014-10-08 DIAGNOSIS — I2699 Other pulmonary embolism without acute cor pulmonale: Secondary | ICD-10-CM

## 2014-10-08 LAB — POCT INR: INR: 2.4

## 2014-10-08 MED ORDER — WARFARIN SODIUM 5 MG PO TABS: ORAL_TABLET | ORAL | Status: DC

## 2014-10-08 NOTE — Progress Notes (Signed)
Anti-Coagulation Progress Note  Richard [name] is a 39 y.o. male who is currently on an anti-coagulation regimen.    RECENT RESULTS: Recent results are below, the most recent result is correlated with a dose of 57.5 mg. per week: Lab Results  Component Value Date   INR 2.40 10/08/2014   INR 2.50 09/06/2014   INR 2.0 08/09/2014   PROTIME 20.4* 03/16/2011    ANTI-COAG DOSE: Anticoagulation Dose Instructions as of 10/08/2014      [name] Mon Tue Wed Thu Fri Sat   New Dose 7.5 mg 7.5 mg 10 mg 7.5 mg 10 mg 7.5 mg 7.5 mg       ANTICOAG SUMMARY: Anticoagulation Episode Summary    Current INR goal 2.0-3.0  Next INR check 11/04/2014  INR from last check 2.40 (10/08/2014)  Weekly max dose   Target end date Indefinite  INR check location [facility]  Preferred lab   Send INR reminders to    Indications  Pulmonary embolism and infarction [I26.99] D V T [I80.299] Encounter for long-term (current) use of anticoagulants [Z79.01]        Comments         ANTICOAG TODAY: Anticoagulation Summary as of 10/08/2014    INR goal 2.0-3.0  Selected INR 2.40 (10/08/2014)  Next INR check 11/04/2014  Target end date Indefinite   Indications  Pulmonary embolism and infarction [I26.99] D V T [I80.299] Encounter for long-term (current) use of anticoagulants [Z79.01]      Anticoagulation Episode Summary    INR check location [facility]   Preferred lab    Send INR reminders to    Comments       PATIENT INSTRUCTIONS: Patient Instructions  Patient instructed to take medications as defined in the Anti-coagulation Track section of this encounter.  Patient instructed to take today's dose.  Patient verbalized understanding of these instructions.       FOLLOW-UP Return in 4 weeks (on 11/04/2014) for Follow up INR at 4:15PM.  Hulen LusterJames Groce, III Pharm.D., CACP

## 2014-10-08 NOTE — Patient Instructions (Signed)
Patient instructed to take medications as defined in the Anti-coagulation Track section of this encounter.  Patient instructed to take today's dose.  Patient verbalized understanding of these instructions.    

## 2014-10-08 NOTE — Addendum Note (Signed)
Addended by: Hulen LusterGROCE, Aarianna Hoadley B on: 10/08/2014 04:32 PM   Modules accepted: Orders

## 2014-10-31 ENCOUNTER — Telehealth: Payer: Self-pay | Admitting: Pharmacist

## 2014-10-31 NOTE — Telephone Encounter (Signed)
Call to patient to confirm appointment for 11/04/14 at 4:30 lmtcb

## 2014-11-04 ENCOUNTER — Ambulatory Visit (INDEPENDENT_AMBULATORY_CARE_PROVIDER_SITE_OTHER): Payer: BLUE CROSS/BLUE SHIELD | Admitting: Pharmacist

## 2014-11-04 DIAGNOSIS — I2699 Other pulmonary embolism without acute cor pulmonale: Secondary | ICD-10-CM

## 2014-11-04 DIAGNOSIS — I80299 Phlebitis and thrombophlebitis of other deep vessels of unspecified lower extremity: Secondary | ICD-10-CM | POA: Diagnosis not present

## 2014-11-04 DIAGNOSIS — Z7901 Long term (current) use of anticoagulants: Secondary | ICD-10-CM | POA: Diagnosis not present

## 2014-11-04 LAB — POCT INR: INR: 3.7

## 2014-11-04 NOTE — Patient Instructions (Signed)
Patient instructed to take medications as defined in the Anti-coagulation Track section of this encounter.  Patient instructed to take today's dose.  Patient verbalized understanding of these instructions.    

## 2014-11-04 NOTE — Progress Notes (Signed)
Anti-Coagulation Progress Note  Richard [name] is a 39 y.o. male who is currently on an anti-coagulation regimen.    RECENT RESULTS: Recent results are below, the most recent result is correlated with a dose of 57.5 mg. per week: Lab Results  Component Value Date   INR 3.70 11/04/2014   INR 2.40 10/08/2014   INR 2.50 09/06/2014   PROTIME 20.4* 03/16/2011    ANTI-COAG DOSE: Anticoagulation Dose Instructions as of 11/04/2014      [name] Mon Tue Wed Thu Fri Sat   New Dose 7.5 mg 7.5 mg 7.5 mg 7.5 mg 7.5 mg 7.5 mg 7.5 mg       ANTICOAG SUMMARY: Anticoagulation Episode Summary    Current INR goal 2.0-3.0  Next INR check 12/02/2014  INR from last check 3.70! (11/04/2014)  Weekly max dose   Target end date Indefinite  [facility]R check location [facility]  Preferred lab   Send INR reminders to    Indications  Pulmonary embolism and infarction [I26.99] D V T [I80.299] Encounter for long-term (current) use of anticoagulants [Z79.01]        Comments         ANTICOAG TODAY: Anticoagulation Summary as of 11/04/2014    INR goal 2.0-3.0  Selected INR 3.70! (11/04/2014)  Next INR check 12/02/2014  Target end date Indefinite   Indications  Pulmonary embolism and infarction [I26.99] D V T [I80.299] Encounter for long-term (current) use of anticoagulants [Z79.01]      Anticoagulation Episode Summary    INR check location [facility]   Preferred lab    Send INR reminders to    Comments       PATIENT INSTRUCTIONS: Patient Instructions  Patient instructed to take medications as defined in the Anti-coagulation Track section of this encounter.  Patient instructed to take today's dose.  Patient verbalized understanding of these instructions.       FOLLOW-UP Return in 4 weeks (on 12/02/2014) for Follow up INR at 4:00PM.  Hulen LusterJames Vyctoria Dickman, III Pharm.D., CACP

## 2014-11-28 ENCOUNTER — Telehealth: Payer: Self-pay | Admitting: Pharmacist

## 2014-11-28 NOTE — Telephone Encounter (Signed)
Call to patient to confirm appointment for 12/02/14 at 4:00 lmtcb

## 2014-12-02 ENCOUNTER — Ambulatory Visit (INDEPENDENT_AMBULATORY_CARE_PROVIDER_SITE_OTHER): Payer: BLUE CROSS/BLUE SHIELD | Admitting: Pharmacist

## 2014-12-02 DIAGNOSIS — I2699 Other pulmonary embolism without acute cor pulmonale: Secondary | ICD-10-CM | POA: Diagnosis not present

## 2014-12-02 LAB — POCT INR: INR: 2.4

## 2014-12-02 MED ORDER — WARFARIN SODIUM 5 MG PO TABS: ORAL_TABLET | ORAL | Status: DC

## 2014-12-02 NOTE — Patient Instructions (Signed)
Patient instructed to take medications as defined in the Anti-coagulation Track section of this encounter.  Patient instructed to take today's dose.  Patient verbalized understanding of these instructions.    

## 2014-12-02 NOTE — Progress Notes (Signed)
Anti-Coagulation Progress Note  Richard [name] is a 39 y.o. male who is currently on an anti-coagulation regimen.  RECENT RESULTS:  Recent results are below, the most recent result is correlated with a dose of 52.5 mg. per week:  Lab Results  Component Value Date   INR 2.4 12/02/2014   INR 3.70 11/04/2014   INR 2.40 10/08/2014   PROTIME 20.4* 03/16/2011    ANTI-COAG DOSE:  Anticoagulation Dose Instructions as of 12/02/2014      [name] Mon Tue Wed Thu Fri Sat   New Dose 7.5 mg 7.5 mg 7.5 mg 7.5 mg 7.5 mg 7.5 mg 7.5 mg      ANTICOAG SUMMARY:  Anticoagulation Episode Summary    Current INR goal 2.0-3.0  Next INR check 12/23/2014  INR from last check 2.4 (12/02/2014)  Weekly max dose   Target end date Indefinite  [facility]R check location [facility]  Preferred lab   Send INR reminders to    Indications  Pulmonary embolism and infarction [I26.99] D V T [I80.299] Encounter for long-term (current) use of anticoagulants [Z79.01]        Comments         ANTICOAG TODAY:  Anticoagulation Summary as of 12/02/2014    INR goal 2.0-3.0  Selected INR 2.4 (12/02/2014)  Next INR check 12/23/2014  Target end date Indefinite   Indications  Pulmonary embolism and infarction [I26.99] D V T [I80.299] Encounter for long-term (current) use of anticoagulants [Z79.01]      Anticoagulation Episode Summary    INR check location [facility]   Preferred lab    Send INR reminders to    Comments       PATIENT INSTRUCTIONS:  Patient Instructions  Patient instructed to take medications as defined in the Anti-coagulation Track section of this encounter.  Patient instructed to take today's dose.  Patient verbalized understanding of these instructions.        FOLLOW-UP  Return in 3 weeks (on 12/23/2014) for Follow up INR @ 4:00pm.    Thank you for allowing pharmacy to be part of this patient's care team  Leaner Morici M. Brieanne Mignone, Pharm.D Clinical Pharmacy Resident Pager: [phone] 12/02/2014 .4:41  PM

## 2014-12-23 ENCOUNTER — Ambulatory Visit (INDEPENDENT_AMBULATORY_CARE_PROVIDER_SITE_OTHER): Payer: BLUE CROSS/BLUE SHIELD | Admitting: Pharmacist

## 2014-12-23 DIAGNOSIS — I2699 Other pulmonary embolism without acute cor pulmonale: Secondary | ICD-10-CM

## 2014-12-23 DIAGNOSIS — Z7901 Long term (current) use of anticoagulants: Secondary | ICD-10-CM

## 2014-12-23 LAB — POCT INR: INR: 5.3

## 2014-12-23 NOTE — Patient Instructions (Signed)
Patient instructed to take medications as defined in the Anti-coagulation Track section of this encounter.  Patient instructed to OMIT TOMORROW's dose (had already taken today's dose).   Patient verbalized understanding of these instructions.    

## 2014-12-23 NOTE — Progress Notes (Signed)
Anti-Coagulation Progress Note  Richard [name] is a 39 y.o. male who is currently on an anti-coagulation regimen.    RECENT RESULTS: Recent results are below, the most recent result is correlated with a dose of 52.5 mg. per week: Lab Results  Component Value Date   INR 5.30 12/23/2014   INR 2.4 12/02/2014   INR 3.70 11/04/2014   PROTIME 20.4* 03/16/2011    ANTI-COAG DOSE: Anticoagulation Dose Instructions as of 12/23/2014      [name] Mon Tue Wed Thu Fri Sat   New Dose 7.5 mg 7.5 mg 7.5 mg 5 mg 7.5 mg 7.5 mg 7.5 mg       ANTICOAG SUMMARY: Anticoagulation Episode Summary    Current INR goal 2.0-3.0  Next INR check 12/30/2014  INR from last check 5.30! (12/23/2014)  Weekly max dose   Target end date Indefinite  [facility]R check location [facility]  Preferred lab   Send INR reminders to    Indications  Pulmonary embolism and infarction [I26.99] D V T [I80.299] Long term (current) use of anticoagulants [Z79.01]        Comments         ANTICOAG TODAY: Anticoagulation Summary as of 12/23/2014    INR goal 2.0-3.0  Selected INR 5.30! (12/23/2014)  Next INR check 12/30/2014  Target end date Indefinite   Indications  Pulmonary embolism and infarction [I26.99] D V T [I80.299] Long term (current) use of anticoagulants [Z79.01]      Anticoagulation Episode Summary    INR check location [facility]   Preferred lab    Send INR reminders to    Comments       PATIENT INSTRUCTIONS: Patient Instructions  Patient instructed to take medications as defined in the Anti-coagulation Track section of this encounter.  Patient instructed to OMIT TOMORROW's dose (had already taken today's dose).  Patient verbalized understanding of these instructions.       FOLLOW-UP Return in 7 days (on 12/30/2014) for Follow up INR at 4PM.  Hulen LusterJames Kadija Cruzen, III Pharm.D., CACP

## 2014-12-24 NOTE — Progress Notes (Signed)
INTERNAL MEDICINE TEACHING ATTENDING ADDENDUM - Ademide Schaberg M.D  Duration- indefinite, Indication- PE, DVT, INR- supratherapeutic. Agree with Dr. Groce's recommendations as outlined in his note.  

## 2014-12-26 ENCOUNTER — Telehealth: Payer: Self-pay | Admitting: Pharmacist

## 2014-12-26 NOTE — Telephone Encounter (Signed)
Call to patient to confirm appointment for 12/30/14 at 4:00 lmtcb

## 2014-12-30 ENCOUNTER — Ambulatory Visit (INDEPENDENT_AMBULATORY_CARE_PROVIDER_SITE_OTHER): Payer: BLUE CROSS/BLUE SHIELD | Admitting: Pharmacist

## 2014-12-30 DIAGNOSIS — I2699 Other pulmonary embolism without acute cor pulmonale: Secondary | ICD-10-CM

## 2014-12-30 DIAGNOSIS — I80299 Phlebitis and thrombophlebitis of other deep vessels of unspecified lower extremity: Secondary | ICD-10-CM | POA: Diagnosis not present

## 2014-12-30 DIAGNOSIS — Z7901 Long term (current) use of anticoagulants: Secondary | ICD-10-CM

## 2014-12-30 LAB — POCT INR: INR: 1.1

## 2014-12-30 NOTE — Progress Notes (Signed)
Anti-Coagulation Progress Note  Richard [name] is a 39 y.o. male who is currently on an anti-coagulation regimen.    RECENT RESULTS: Recent results are below, the most recent result is correlated with a dose of 50 mg. per week: Lab Results  Component Value Date   INR 1.10 12/30/2014   INR 5.30 12/23/2014   INR 2.4 12/02/2014   PROTIME 20.4* 03/16/2011    ANTI-COAG DOSE: Anticoagulation Dose Instructions as of 12/30/2014      [name] Mon Tue Wed Thu Fri Sat   New Dose 5 mg 7.5 mg 7.5 mg 7.5 mg 7.5 mg 7.5 mg 7.5 mg       ANTICOAG SUMMARY: Anticoagulation Episode Summary    Current INR goal 2.0-3.0  Next INR check 01/06/2015  INR from last check 1.10! (12/30/2014)  Weekly max dose   Target end date Indefinite  INR check location [facility]  Preferred lab   Send INR reminders to    Indications  Pulmonary embolism and infarction [I26.99] D V T [I80.299] Long term (current) use of anticoagulants [Z79.01]        Comments         ANTICOAG TODAY: Anticoagulation Summary as of 12/30/2014    INR goal 2.0-3.0  Selected INR 1.10! (12/30/2014)  Next INR check 01/06/2015  Target end date Indefinite   Indications  Pulmonary embolism and infarction [I26.99] D V T [I80.299] Long term (current) use of anticoagulants [Z79.01]      Anticoagulation Episode Summary    INR check location [facility]   Preferred lab    Send INR reminders to    Comments       PATIENT INSTRUCTIONS: Patient Instructions  Patient instructed to take medications as defined in the Anti-coagulation Track section of this encounter.  Patient instructed to take today's dose.  Patient verbalized understanding of these instructions.       FOLLOW-UP Return in 7 days (on 01/06/2015) for Follow up INR at 4PM.  Hulen LusterJames Ahmiyah Coil, III Pharm.D., CACP

## 2014-12-30 NOTE — Addendum Note (Signed)
Addended by: Hulen LusterGROCE, Durk Carmen B on: 12/30/2014 05:07 PM   Modules accepted: Level of Service

## 2014-12-30 NOTE — Patient Instructions (Signed)
Patient instructed to take medications as defined in the Anti-coagulation Track section of this encounter.  Patient instructed to take today's dose.  Patient verbalized understanding of these instructions.    

## 2014-12-31 NOTE — Progress Notes (Signed)
INTERNAL MEDICINE TEACHING ATTENDING ADDENDUM - Betzy Barbier M.D  Duration- indefinite, Indication- PE, DVT, INR- sub therapeutic. Agree with pharmacy recommendations as outlined in their note.      

## 2015-01-06 ENCOUNTER — Ambulatory Visit (INDEPENDENT_AMBULATORY_CARE_PROVIDER_SITE_OTHER): Payer: BLUE CROSS/BLUE SHIELD | Admitting: Pharmacist

## 2015-01-06 DIAGNOSIS — Z7901 Long term (current) use of anticoagulants: Secondary | ICD-10-CM

## 2015-01-06 DIAGNOSIS — I2699 Other pulmonary embolism without acute cor pulmonale: Secondary | ICD-10-CM

## 2015-01-06 LAB — POCT INR: INR: 2.1

## 2015-01-06 NOTE — Patient Instructions (Signed)
Patient instructed to take medications as defined in the Anti-coagulation Track section of this encounter.  Patient instructed to take today's dose.  Patient verbalized understanding of these instructions.    

## 2015-01-06 NOTE — Progress Notes (Signed)
Anti-Coagulation Progress Note  Richard [name] is a 39 y.o. male who is currently on an anti-coagulation regimen.    RECENT RESULTS: Recent results are below, the most recent result is correlated with a dose of 50 mg. per week: Lab Results  Component Value Date   INR 2.10 01/06/2015   INR 1.10 12/30/2014   INR 5.30 12/23/2014   PROTIME 20.4* 03/16/2011    ANTI-COAG DOSE: Anticoagulation Dose Instructions as of 01/06/2015      [name] Mon Tue Wed Thu Fri Sat   New Dose 7.5 mg 7.5 mg 10 mg 7.5 mg 7.5 mg 10 mg 7.5 mg       ANTICOAG SUMMARY: Anticoagulation Episode Summary    Current INR goal 2.0-3.0  Next INR check 01/27/2015  INR from last check 2.10 (01/06/2015)  Weekly max dose   Target end date Indefinite  INR check location [facility]  Preferred lab   Send INR reminders to    Indications  Pulmonary embolism and infarction [I26.99] D V T [I80.299] Long term (current) use of anticoagulants [Z79.01]        Comments         ANTICOAG TODAY: Anticoagulation Summary as of 01/06/2015    INR goal 2.0-3.0  Selected INR 2.10 (01/06/2015)  Next INR check 01/27/2015  Target end date Indefinite   Indications  Pulmonary embolism and infarction [I26.99] D V T [I80.299] Long term (current) use of anticoagulants [Z79.01]      Anticoagulation Episode Summary    INR check location [facility]   Preferred lab    Send INR reminders to    Comments       PATIENT INSTRUCTIONS: Patient Instructions  Patient instructed to take medications as defined in the Anti-coagulation Track section of this encounter.  Patient instructed to take today's dose.  Patient verbalized understanding of these instructions.       FOLLOW-UP Return in 3 weeks (on 01/27/2015) for Follow up INR at 4PM.  Hulen LusterJames Ronnie Doo, III Pharm.D., CACP

## 2015-01-07 NOTE — Progress Notes (Signed)
I have reviewed Dr. Saralyn PilarGroce's note.  Patient is on anticoagulation for recurrent PE.

## 2015-01-23 ENCOUNTER — Telehealth: Payer: Self-pay | Admitting: Pharmacist

## 2015-01-23 NOTE — Telephone Encounter (Signed)
Call to patient to confirm appointment for 01/27/15 at 4:00 lmtcb

## 2015-01-27 ENCOUNTER — Ambulatory Visit (INDEPENDENT_AMBULATORY_CARE_PROVIDER_SITE_OTHER): Payer: BLUE CROSS/BLUE SHIELD | Admitting: Pharmacist

## 2015-01-27 DIAGNOSIS — Z7901 Long term (current) use of anticoagulants: Secondary | ICD-10-CM | POA: Diagnosis not present

## 2015-01-27 DIAGNOSIS — I2699 Other pulmonary embolism without acute cor pulmonale: Secondary | ICD-10-CM | POA: Diagnosis not present

## 2015-01-27 LAB — POCT INR: INR: 3.4

## 2015-01-27 MED ORDER — WARFARIN SODIUM 5 MG PO TABS: ORAL_TABLET | ORAL | Status: DC

## 2015-01-27 NOTE — Patient Instructions (Signed)
Patient instructed to take medications as defined in the Anti-coagulation Track section of this encounter.  Patient instructed to take today's dose.  Patient verbalized understanding of these instructions.    

## 2015-01-27 NOTE — Progress Notes (Signed)
Anti-Coagulation Progress Note  Richard GandyMicha [name] is a 39 y.o. male who is currently on an anti-coagulation regimen.    RECENT RESULTS: Recent results are below, the most recent result is correlated with a dose of 57.5 mg. per week: Lab Results  Component Value Date   INR 3.40 01/27/2015   INR 2.10 01/06/2015   INR 1.10 12/30/2014   PROTIME 20.4* 03/16/2011    ANTI-COAG DOSE: Anticoagulation Dose Instructions as of 01/27/2015      [name] Mon Tue Wed Thu Fri Sat   New Dose 7.5 mg 7.5 mg 7.5 mg 7.5 mg 7.5 mg 7.5 mg 7.5 mg       ANTICOAG SUMMARY: Anticoagulation Episode Summary    Current INR goal 2.0-3.0  Next INR check 02/17/2015  INR from last check 3.40! (01/27/2015)  Weekly max dose   Target end date Indefinite  INR check location [facility]  Preferred lab   Send INR reminders to    Indications  Pulmonary embolism and infarction [I26.99] D V T [I80.299] Long term (current) use of anticoagulants [Z79.01]        Comments         ANTICOAG TODAY: Anticoagulation Summary as of 01/27/2015    INR goal 2.0-3.0  Selected INR 3.40! (01/27/2015)  Next INR check 02/17/2015  Target end date Indefinite   Indications  Pulmonary embolism and infarction [I26.99] D V T [I80.299] Long term (current) use of anticoagulants [Z79.01]      Anticoagulation Episode Summary    INR check location [facility]   Preferred lab    Send INR reminders to    Comments       PATIENT INSTRUCTIONS: Patient Instructions  Patient instructed to take medications as defined in the Anti-coagulation Track section of this encounter.  Patient instructed to take today's dose.  Patient verbalized understanding of these instructions.       FOLLOW-UP Return in 3 weeks (on 02/17/2015) for Follow up INR at 4:30PM.  Hulen LusterJames Shayne Deerman, III Pharm.D., CACP

## 2015-01-30 ENCOUNTER — Emergency Department (HOSPITAL_COMMUNITY)
Admission: EM | Admit: 2015-01-30 | Discharge: 2015-01-30 | Disposition: A | Discharge status: Home / Self Care | Payer: BLUE CROSS/BLUE SHIELD | Attending: Emergency Medicine | Admitting: Emergency Medicine

## 2015-01-30 ENCOUNTER — Emergency Department (HOSPITAL_COMMUNITY): Payer: BLUE CROSS/BLUE SHIELD

## 2015-01-30 ENCOUNTER — Encounter (HOSPITAL_COMMUNITY): Payer: Self-pay

## 2015-01-30 DIAGNOSIS — Y92410 Unspecified street and highway as the place of occurrence of the external cause: Secondary | ICD-10-CM | POA: Diagnosis not present

## 2015-01-30 DIAGNOSIS — Y9389 Activity, other specified: Secondary | ICD-10-CM | POA: Insufficient documentation

## 2015-01-30 DIAGNOSIS — Y999 Unspecified external cause status: Secondary | ICD-10-CM | POA: Diagnosis not present

## 2015-01-30 DIAGNOSIS — Z7901 Long term (current) use of anticoagulants: Secondary | ICD-10-CM | POA: Diagnosis not present

## 2015-01-30 DIAGNOSIS — Z87891 Personal history of nicotine dependence: Secondary | ICD-10-CM | POA: Diagnosis not present

## 2015-01-30 DIAGNOSIS — S6992XA Unspecified injury of left wrist, hand and finger(s), initial encounter: Secondary | ICD-10-CM | POA: Diagnosis present

## 2015-01-30 DIAGNOSIS — S62011A Displaced fracture of distal pole of navicular [scaphoid] bone of right wrist, initial encounter for closed fracture: Secondary | ICD-10-CM | POA: Diagnosis not present

## 2015-01-30 DIAGNOSIS — Z79899 Other long term (current) drug therapy: Secondary | ICD-10-CM | POA: Diagnosis not present

## 2015-01-30 DIAGNOSIS — S62002A Unspecified fracture of navicular [scaphoid] bone of left wrist, initial encounter for closed fracture: Secondary | ICD-10-CM

## 2015-01-30 DIAGNOSIS — Z86718 Personal history of other venous thrombosis and embolism: Secondary | ICD-10-CM | POA: Diagnosis not present

## 2015-01-30 MED ORDER — HYDROCODONE-ACETAMINOPHEN 5-325 MG PO TABS: 1.0000 | ORAL_TABLET | Freq: Four times a day (QID) | ORAL | Status: DC | PRN

## 2015-01-30 NOTE — ED Notes (Signed)
PA Joe at the bedside.  

## 2015-01-30 NOTE — ED Notes (Signed)
Pt verbalizes understanding of d/c instructions and denies any further need at this time. 
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
## 2015-01-30 NOTE — Progress Notes (Signed)
Orthopedic Tech Progress Note Patient Details:  Richard [name] Mar 24, 1976 [id number]  Ortho Devices Type of Ortho Device: Ace wrap, Thumb spica splint Splint Material: Fiberglass Ortho Device/Splint Location: LUE Ortho Device/Splint Interventions: Ordered, Application   [name] 01/30/2015, 8:04 PM
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
## 2015-01-30 NOTE — ED Notes (Signed)
Patient returned from [place].

## 2015-02-14 ENCOUNTER — Telehealth: Payer: Self-pay | Admitting: Pharmacist

## 2015-02-14 NOTE — Telephone Encounter (Signed)
Call to patient to confirm appointment for 02/17/15 at 4:30 lmtcb

## 2015-02-17 ENCOUNTER — Ambulatory Visit (INDEPENDENT_AMBULATORY_CARE_PROVIDER_SITE_OTHER): Payer: BLUE CROSS/BLUE SHIELD | Admitting: Pharmacist

## 2015-02-17 DIAGNOSIS — I2699 Other pulmonary embolism without acute cor pulmonale: Secondary | ICD-10-CM | POA: Diagnosis not present

## 2015-02-17 DIAGNOSIS — Z7901 Long term (current) use of anticoagulants: Secondary | ICD-10-CM

## 2015-02-17 LAB — POCT INR: INR: 2.5

## 2015-02-17 NOTE — Progress Notes (Signed)
Anti-Coagulation Progress Note  Richard [name] is a 39 y.o. male who is currently on an anti-coagulation regimen.    RECENT RESULTS: Recent results are below, the most recent result is correlated with a dose of 52.5 mg. per week: Lab Results  Component Value Date   INR 2.50 02/17/2015   INR 3.40 01/27/2015   INR 2.10 01/06/2015   PROTIME 20.4* 03/16/2011    ANTI-COAG DOSE: Anticoagulation Dose Instructions as of 02/17/2015      [name] Mon Tue Wed Thu Fri Sat   New Dose 7.5 mg 7.5 mg 7.5 mg 7.5 mg 7.5 mg 7.5 mg 7.5 mg       ANTICOAG SUMMARY: Anticoagulation Episode Summary    Current INR goal 2.0-3.0  Next INR check 03/17/2015  INR from last check 2.50 (02/17/2015)  Weekly max dose   Target end date Indefinite  [facility]  Preferred lab   Send INR reminders to    Indications  Pulmonary embolism and infarction [I26.99] D V T [I80.299] Long term (current) use of anticoagulants [Z79.01]        Comments         ANTICOAG TODAY: Anticoagulation Summary as of 02/17/2015    INR goal 2.0-3.0  Selected INR 2.50 (02/17/2015)  Next INR check 03/17/2015  Target end date Indefinite   Indications  Pulmonary embolism and infarction [I26.99] D V T [I80.299] Long term (current) use of anticoagulants [Z79.01]      Anticoagulation Episode Summary    [facility]   Preferred lab    Send INR reminders to    Comments       PATIENT INSTRUCTIONS: Patient Instructions  Patient instructed to take medications as defined in the Anti-coagulation Track section of this encounter.  Patient instructed to take today's dose.  Patient verbalized understanding of these instructions.       FOLLOW-UP Return in 4 weeks (on 03/17/2015) for Follow up INR at 4:30PM.  Hulen LusterJames Groce, III Pharm.D., CACP

## 2015-02-17 NOTE — Patient Instructions (Signed)
Patient instructed to take medications as defined in the Anti-coagulation Track section of this encounter.  Patient instructed to take today's dose.  Patient verbalized understanding of these instructions.    

## 2015-02-26 ENCOUNTER — Encounter: Payer: BLUE CROSS/BLUE SHIELD | Admitting: Internal Medicine

## 2015-03-17 ENCOUNTER — Ambulatory Visit (INDEPENDENT_AMBULATORY_CARE_PROVIDER_SITE_OTHER): Payer: BLUE CROSS/BLUE SHIELD | Admitting: Pharmacist

## 2015-03-17 DIAGNOSIS — I2699 Other pulmonary embolism without acute cor pulmonale: Secondary | ICD-10-CM

## 2015-03-17 DIAGNOSIS — Z7901 Long term (current) use of anticoagulants: Secondary | ICD-10-CM

## 2015-03-17 LAB — POCT INR: INR: 3

## 2015-03-17 NOTE — Patient Instructions (Signed)
Patient instructed to take medications as defined in the Anti-coagulation Track section of this encounter.  Patient instructed to take today's dose.  Patient verbalized understanding of these instructions.    

## 2015-03-17 NOTE — Progress Notes (Signed)
Anti-Coagulation Progress Note  Richard [name] is a 39 y.o. male who is currently on an anti-coagulation regimen.    RECENT RESULTS: Recent results are below, the most recent result is correlated with a dose of 52.5 mg. per week: Lab Results  Component Value Date   INR 3.0 03/17/2015   INR 2.50 02/17/2015   INR 3.40 01/27/2015   PROTIME 20.4* 03/16/2011    ANTI-COAG DOSE: Anticoagulation Dose Instructions as of 03/17/2015      [name] Tue Wed Thu Fri Sat   New Dose 7.5 mg 7.5 mg 5 mg 7.5 mg 7.5 mg 7.5 mg 7.5 mg       ANTICOAG SUMMARY: Anticoagulation Episode Summary    Current INR goal 2.0-3.0  Next INR check 04/14/2015  INR from last check 3.0 (03/17/2015)  Weekly max dose   Target end date Indefinite  [facility]  Preferred lab   Send INR reminders to    Indications  Pulmonary embolism and infarction [I26.99] D V T [I80.299] Long term (current) use of anticoagulants [Z79.01]        Comments         ANTICOAG TODAY: Anticoagulation Summary as of 03/17/2015    INR goal 2.0-3.0  Selected INR 3.0 (03/17/2015)  Next INR check 04/14/2015  Target end date Indefinite   Indications  Pulmonary embolism and infarction [I26.99] D V T [I80.299] Long term (current) use of anticoagulants [Z79.01]      Anticoagulation Episode Summary    [facility]   Preferred lab    Send INR reminders to    Comments       PATIENT INSTRUCTIONS: Patient Instructions  Patient instructed to take medications as defined in the Anti-coagulation Track section of this encounter.  Patient instructed to take today's dose.  Patient verbalized understanding of these instructions.       FOLLOW-UP Return in 4 weeks (on 04/14/2015) for Follow up INR at 4:30PM.  [name], III Pharm.D., CACP

## 2015-03-21 NOTE — Progress Notes (Signed)
I have reviewed Dr. Saralyn Pilar note.  Patient is on anticoagulation for VTE.  INR 3.0, coumadin decreased.

## 2015-04-14 ENCOUNTER — Ambulatory Visit (INDEPENDENT_AMBULATORY_CARE_PROVIDER_SITE_OTHER): Payer: BLUE CROSS/BLUE SHIELD | Admitting: Pharmacist

## 2015-04-14 DIAGNOSIS — I2699 Other pulmonary embolism without acute cor pulmonale: Secondary | ICD-10-CM | POA: Diagnosis not present

## 2015-04-14 LAB — POCT INR: INR: 2.8

## 2015-04-14 NOTE — Progress Notes (Signed)
Anti-Coagulation Progress Note  Richard [name] is a 39 y.o. male who is currently on an anti-coagulation regimen.    RECENT RESULTS: Recent results are below, the most recent result is correlated with a dose of 50 mg. per week: Lab Results  Component Value Date   INR 2.80 04/14/2015   INR 3.0 03/17/2015   INR 2.50 02/17/2015   PROTIME 20.4* 03/16/2011    ANTI-COAG DOSE: Anticoagulation Dose Instructions as of 04/14/2015      [name] Tue Wed Thu Fri Sat   New Dose 7.5 mg 7.5 mg 5 mg 7.5 mg 7.5 mg 7.5 mg 7.5 mg       ANTICOAG SUMMARY: Anticoagulation Episode Summary    Current INR goal 2.0-3.0  Next INR check 05/12/2015  INR from last check 2.80 (04/14/2015)  Weekly max dose   Target end date Indefinite  [facility]  Preferred lab   Send INR reminders to    Indications  Pulmonary embolism and infarction [I26.99] D V T [I80.299] Long term (current) use of anticoagulants [Z79.01]        Comments         ANTICOAG TODAY: Anticoagulation Summary as of 04/14/2015    INR goal 2.0-3.0  Selected INR 2.80 (04/14/2015)  Next INR check 05/12/2015  Target end date Indefinite   Indications  Pulmonary embolism and infarction [I26.99] D V T [I80.299] Long term (current) use of anticoagulants [Z79.01]      Anticoagulation Episode Summary    [facility] check [facility]   Preferred lab    Send INR reminders to    Comments       PATIENT INSTRUCTIONS: Patient Instructions  Patient instructed to take medications as defined in the Anti-coagulation Track section of this encounter.  Patient instructed to take today's dose.  Patient verbalized understanding of these instructions.       FOLLOW-UP Return in 4 weeks (on 05/12/2015) for Follow up INR at 4:15PM.  [name], III Pharm.D., CACP

## 2015-04-14 NOTE — Progress Notes (Signed)
Indication: Recurrent venous thromboembolism. Duration: Lifelong. INR: At target. Agree with Dr. Groce's assessment and plan. 

## 2015-04-14 NOTE — Patient Instructions (Signed)
Patient instructed to take medications as defined in the Anti-coagulation Track section of this encounter.  Patient instructed to take today's dose.  Patient verbalized understanding of these instructions.    

## 2015-05-01 ENCOUNTER — Other Ambulatory Visit: Payer: Self-pay | Admitting: Internal Medicine

## 2015-05-12 ENCOUNTER — Ambulatory Visit (INDEPENDENT_AMBULATORY_CARE_PROVIDER_SITE_OTHER): Payer: BLUE CROSS/BLUE SHIELD | Admitting: Pharmacist

## 2015-05-12 DIAGNOSIS — Z7901 Long term (current) use of anticoagulants: Secondary | ICD-10-CM | POA: Diagnosis not present

## 2015-05-12 DIAGNOSIS — I2699 Other pulmonary embolism without acute cor pulmonale: Secondary | ICD-10-CM | POA: Diagnosis not present

## 2015-05-12 LAB — POCT INR: INR: 2.9

## 2015-05-12 MED ORDER — WARFARIN SODIUM 5 MG PO TABS: ORAL_TABLET | ORAL | Status: DC

## 2015-05-12 NOTE — Progress Notes (Signed)
Anti-Coagulation Progress Note  Richard [name] is a 39 y.o. male who is currently on an anti-coagulation regimen.    RECENT RESULTS: Recent results are below, the most recent result is correlated with a dose of 50 mg. per week: Lab Results  Component Value Date   INR 2.90 05/12/2015   INR 2.80 04/14/2015   INR 3.0 03/17/2015   PROTIME 20.4* 03/16/2011    ANTI-COAG DOSE: Anticoagulation Dose Instructions as of 05/12/2015      [name] Tue Wed Thu Fri Sat   New Dose 7.5 mg 7.5 mg 5 mg 5 mg 7.5 mg 5 mg 7.5 mg       ANTICOAG SUMMARY: Anticoagulation Episode Summary    Current INR goal 2.0-3.0  Next INR check 06/09/2015  INR from last check 2.90 (05/12/2015)  Weekly max dose   Target end date Indefinite  [facility] check location [facility]  Preferred lab   Send INR reminders to    Indications  Pulmonary embolism and infarction [I26.99] D V T [I80.299] Long term (current) use of anticoagulants [Z79.01]        Comments         ANTICOAG TODAY: Anticoagulation Summary as of 05/12/2015    INR goal 2.0-3.0  Selected INR 2.90 (05/12/2015)  Next INR check 06/09/2015  Target end date Indefinite   Indications  Pulmonary embolism and infarction [I26.99] D V T [I80.299] Long term (current) use of anticoagulants [Z79.01]      Anticoagulation Episode Summary    [facility]   Preferred lab    Send INR reminders to    Comments       PATIENT INSTRUCTIONS: Patient Instructions  Patient instructed to take medications as defined in the Anti-coagulation Track section of this encounter.  Patient instructed to take today's dose.  Patient verbalized understanding of these instructions.       FOLLOW-UP Return in 4 weeks (on 06/09/2015) for Follow up INR at 4PM.  [name], III Pharm.D., CACP

## 2015-05-12 NOTE — Patient Instructions (Signed)
Patient instructed to take medications as defined in the Anti-coagulation Track section of this encounter.  Patient instructed to take today's dose.  Patient verbalized understanding of these instructions.    

## 2015-06-09 ENCOUNTER — Ambulatory Visit (INDEPENDENT_AMBULATORY_CARE_PROVIDER_SITE_OTHER): Payer: BLUE CROSS/BLUE SHIELD | Admitting: Pharmacist

## 2015-06-09 DIAGNOSIS — I2699 Other pulmonary embolism without acute cor pulmonale: Secondary | ICD-10-CM | POA: Diagnosis not present

## 2015-06-09 DIAGNOSIS — Z7901 Long term (current) use of anticoagulants: Secondary | ICD-10-CM | POA: Diagnosis not present

## 2015-06-09 LAB — POCT INR: INR: 2.3

## 2015-06-09 NOTE — Progress Notes (Signed)
Anti-Coagulation Progress Note  Richard GandyMicha [name] is a 39 y.o. male who is currently on an anti-coagulation regimen.    RECENT RESULTS: Recent results are below, the most recent result is correlated with a dose of 45 mg. per week: Lab Results  Component Value Date   INR 2.30 06/09/2015   INR 2.90 05/12/2015   INR 2.80 04/14/2015   PROTIME 20.4* 03/16/2011    ANTI-COAG DOSE: Anticoagulation Dose Instructions as of 06/09/2015      [name] Mon Tue Wed Thu Fri Sat   New Dose 7.5 mg 7.5 mg 5 mg 5 mg 7.5 mg 5 mg 7.5 mg       ANTICOAG SUMMARY: Anticoagulation Episode Summary    Current INR goal 2.0-3.0  Next INR check 07/07/2015  INR from last check 2.30 (06/09/2015)  Weekly max dose   Target end date Indefinite  [facility] check [facility]  Preferred lab   Send INR reminders to    Indications  Pulmonary embolism and infarction (HCC) [I26.99] D V T [I80.299] Long term (current) use of anticoagulants [Z79.01]        Comments         ANTICOAG TODAY: Anticoagulation Summary as of 06/09/2015    INR goal 2.0-3.0  Selected INR 2.30 (06/09/2015)  Next INR check 07/07/2015  Target end date Indefinite   Indications  Pulmonary embolism and infarction (HCC) [I26.99] D V T [I80.299] Long term (current) use of anticoagulants [Z79.01]      Anticoagulation Episode Summary    [facility] check [facility]   Preferred lab    Send INR reminders to    Comments       PATIENT INSTRUCTIONS: Patient Instructions  Patient instructed to take medications as defined in the Anti-coagulation Track section of this encounter.  Patient instructed to take today's dose.  Patient verbalized understanding of these instructions.       FOLLOW-UP Return in 4 weeks (on 07/07/2015) for Follow up INR at 3PM.  Hulen LusterJames Jerimey Burridge, III Pharm.D., CACP

## 2015-06-09 NOTE — Patient Instructions (Signed)
Patient instructed to take medications as defined in the Anti-coagulation Track section of this encounter.  Patient instructed to take today's dose.  Patient verbalized understanding of these instructions.    

## 2015-06-11 NOTE — Progress Notes (Signed)
INTERNAL MEDICINE TEACHING ATTENDING ADDENDUM - Amea Mcphail M.D  Duration- indefinite, Indication- recurrent PE, INR- therapeutic. Agree with pharmacy recommendations as outlined in their note.      

## 2015-07-07 ENCOUNTER — Ambulatory Visit (INDEPENDENT_AMBULATORY_CARE_PROVIDER_SITE_OTHER): Payer: BLUE CROSS/BLUE SHIELD | Admitting: Pharmacist

## 2015-07-07 DIAGNOSIS — I2699 Other pulmonary embolism without acute cor pulmonale: Secondary | ICD-10-CM | POA: Diagnosis not present

## 2015-07-07 DIAGNOSIS — Z7901 Long term (current) use of anticoagulants: Secondary | ICD-10-CM

## 2015-07-07 LAB — POCT INR: INR: 3.5

## 2015-07-07 NOTE — Patient Instructions (Signed)
Patient educated about medication as defined in this encounter and verbalized understanding by repeating back instructions provided.   

## 2015-07-07 NOTE — Progress Notes (Signed)
Anticoagulation Management Richard [name] is a 39 y.o. male who reports to the clinic for monitoring of warfarin treatment.    Indication: DVT and PE Duration: indefinite  Anticoagulation Clinic Visit History: Anticoagulation Episode Summary    Current INR goal 2.0-3.0  Next INR check 07/22/2015  INR from last check 3.5! (07/07/2015)  Weekly max dose   Target end date Indefinite  [facility]  Preferred lab   Send INR reminders to    Indications  Pulmonary embolism and infarction (HCC) [I26.99] D V T [I80.299] Long term (current) use of anticoagulants [Z79.01]        Comments        ASSESSMENT Recent Results: Recent results are below, the most recent result is correlated with a dose of 45 mg per week: Lab Results  Component Value Date   INR 3.5 07/07/2015   INR 2.30 06/09/2015   INR 2.90 05/12/2015   PROTIME 20.4* 03/16/2011   INR today: Supratherapeutic  Anticoagulation Dosing: INR as of 07/07/2015 and Previous Dosing Information    INR Dt [facility] Goal [facility] Tot [name]   07/07/2015 3.5 2.0-3.0 45 mg 7.5 mg 7.5 mg 5 mg 5 mg 7.5 mg 5 mg 7.5 mg    Anticoagulation Dose Instructions as of 07/07/2015      Total [name]   New Dose 42.5 mg 7.5 mg 5 mg 5 mg 5 mg 7.5 mg 5 mg 7.5 mg     (5 mg x 1.5)  (5 mg x 1)  (5 mg x 1)  (5 mg x 1)  (5 mg x 1.5)  (5 mg x 1)  (5 mg x 1.5)                           PLAN Weekly dose was decreased by 6% to 42.5 mg per week  Patient Instructions  Patient educated about medication as defined in this encounter and verbalized understanding by repeating back instructions provided.   Follow-up Return in about 2 weeks (around 07/22/2015) for Follow up INR on 07/22/15 at 4pm.  Kim,Jennifer J  15 minutes spent face-to-face with the patient during the encounter. 50% of time spent on education. 50% of time was spent on assessment and plan.

## 2015-07-08 NOTE — Progress Notes (Signed)
I have reviewed Dr. Elmyra RicksKim's note.  INR was supratherapeutic and the coumadin dose was decreased.  She has a history of VTE.

## 2015-07-09 ENCOUNTER — Encounter: Payer: BLUE CROSS/BLUE SHIELD | Admitting: Internal Medicine

## 2015-07-09 ENCOUNTER — Encounter: Payer: Self-pay | Admitting: Internal Medicine

## 2015-07-22 ENCOUNTER — Ambulatory Visit (INDEPENDENT_AMBULATORY_CARE_PROVIDER_SITE_OTHER): Payer: BLUE CROSS/BLUE SHIELD | Admitting: Pharmacist

## 2015-07-22 DIAGNOSIS — I2699 Other pulmonary embolism without acute cor pulmonale: Secondary | ICD-10-CM | POA: Diagnosis not present

## 2015-07-22 DIAGNOSIS — Z7901 Long term (current) use of anticoagulants: Secondary | ICD-10-CM | POA: Diagnosis not present

## 2015-07-22 LAB — POCT INR: INR: 1.9

## 2015-07-22 NOTE — Patient Instructions (Signed)
Patient educated about medication as defined in this encounter and verbalized understanding by repeating back instructions provided.   

## 2015-07-22 NOTE — Progress Notes (Signed)
Indication: Recurrent venous thromboembolism Duration: Lifelong INR: Below target.  Dr. Elmyra RicksKim's assessment and plan were reviewed and I agree with his documentation.

## 2015-07-22 NOTE — Progress Notes (Signed)
Anticoagulation Management [name] is a 39 y.o. male who reports to the clinic for monitoring of warfarin treatment.    Indication: DVT and PE Duration: indefinite  Anticoagulation Clinic Visit History: Anticoagulation Episode Summary    Current INR goal 2.0-3.0  Next INR check 08/20/2015  INR from last check 1.9! (07/22/2015)  Weekly max dose   Target end date Indefinite  [facility]  Preferred lab   Send INR reminders to    Indications  Pulmonary embolism and infarction (HCC) [I26.99] D V T [I80.299] Long term (current) use of anticoagulants [Z79.01]        Comments        ASSESSMENT Recent Results: Recent results are below, the most recent result is correlated with a dose of 42.5 mg per week: Lab Results  Component Value Date   INR 1.9 07/22/2015   INR 3.5 07/07/2015   INR 2.30 06/09/2015   PROTIME 20.4* 03/16/2011   INR today: slightly subtherapeutic, will not change dose today but if continues to be subtherapeutic at next visit, will increase dose  Anticoagulation Dosing: INR as of 07/22/2015 and Previous Dosing Information    INR Dt [facility]al [facility] Tot [name]   07/22/2015 1.9 2.0-3.0 42.5 mg 7.5 mg 5 mg 5 mg 5 mg 7.5 mg 5 mg 7.5 mg    Anticoagulation Dose Instructions as of 07/22/2015      Total [name]   New Dose 42.5 mg 7.5 mg 5 mg 5 mg 5 mg 7.5 mg 5 mg 7.5 mg     (5 mg x 1.5)  (5 mg x 1)  (5 mg x 1)  (5 mg x 1)  (5 mg x 1.5)  (5 mg x 1)  (5 mg x 1.5)                           PLAN Weekly dose was unchanged pending INR result at next visit.  Patient Instructions  Patient educated about medication as defined in this encounter and verbalized understanding by repeating back instructions provided.   Follow-up Return in about 4 weeks (around 08/20/2015) for Follow up INR on 08/20/15 at 3pm.  Emmaus Brandi J  15 minutes spent face-to-face with the patient during the encounter.  50% of time spent on education. 50% of time was spent on assessment and plan.

## 2015-08-13 ENCOUNTER — Other Ambulatory Visit: Payer: Self-pay | Admitting: Internal Medicine

## 2015-08-14 ENCOUNTER — Ambulatory Visit (INDEPENDENT_AMBULATORY_CARE_PROVIDER_SITE_OTHER): Payer: BLUE CROSS/BLUE SHIELD | Admitting: Internal Medicine

## 2015-08-14 ENCOUNTER — Encounter: Payer: Self-pay | Admitting: Internal Medicine

## 2015-08-14 VITALS — BP 129/65 | HR 76 | Temp 98.3°F | Ht 72.0 in | Wt 215.6 lb

## 2015-08-14 DIAGNOSIS — L03818 Cellulitis of other sites: Secondary | ICD-10-CM

## 2015-08-14 DIAGNOSIS — R21 Rash and other nonspecific skin eruption: Secondary | ICD-10-CM | POA: Diagnosis not present

## 2015-08-14 MED ORDER — IVERMECTIN 3 MG PO TABS: 200.0000 ug/kg | ORAL_TABLET | Freq: Once | ORAL | Status: DC

## 2015-08-14 NOTE — Progress Notes (Signed)
Subjective:   Patient ID: Richard [name] male   DOB: 08-24-1975 39 y.o.   MRN: [id number]  HPI: [name] is a 39 y.o. with past medical history as outlined below who presents to clinic for rash on trunk, extremities, and groin. The rash on his trunk started 2 months ago, and the rash on his groin started 2 weeks ago. He states he was seen at urgent care in [facility] for a non painful bump on his penile shaft and was given valtrex which helped heal the rash. Also told at that time it could be molluscum contagiosum due to the indention at the top of the bump.  He was tested for herpes at that time which came back as low titer, the rest of his STD tests came back negative. His previous partner was HPV positive and he recently noted 3 dark flat spots on the right side of his penis. Denies fevers, chills, new sexual partners, dysuria, and penile discharge.   Please see problem list for status of the pt's chronic medical problems.  Past Medical History  Diagnosis Date  . DVT (deep venous thrombosis)   . Seasonal allergies    Current Outpatient Prescriptions  Medication Sig Dispense Refill  . cetirizine (ZYRTEC) 10 MG tablet Take 1 tablet (10 mg total) by mouth daily. 100 tablet 0  . Guaifenesin-Codeine 400-10 MG TABS Take 1 tablet by mouth every 6 (six) hours as needed. 30 each 0  . HYDROcodone-acetaminophen (NORCO/VICODIN) 5-325 MG per tablet Take 1-2 tablets by mouth every 6 (six) hours as needed. 20 tablet 0  . warfarin (COUMADIN) 5 MG tablet Take 1 tablet on Tuesdays/Wednesdays/Fridays; take 1&1/2 tablets all other days. 44 tablet 2  . warfarin (COUMADIN) 5 MG tablet TAKE 1 AND ONE-HALF TABLETS ON SUNDAYS AND WEDNESDAYS. 2 TABLETS ALL OTHER DAYS OF WEEK 50 tablet 11   No current facility-administered medications for this visit.   Family History  Problem Relation Age of Onset  . Other Mother   . Other Father   . Pulmonary embolism Maternal Uncle    Social History   Social History   . Marital Status: Single    Spouse Name: N/A  . Number of Children: N/A  . Years of Education: N/A   Social History Main Topics  . Smoking status: Former Smoker -- 1 years    Types: Cigarettes, Cigars    Quit date: 04/05/2012  . Smokeless tobacco: Not on file  . Alcohol Use: Yes  . Drug Use: No  . Sexual Activity: Not on file   Other Topics Concern  . Not on file   Social History Narrative   Review of Systems: Review of Systems  Constitutional: Negative for fever and chills.  Genitourinary: Negative for dysuria.       Neg for discharge, new sexual partners, remote hx of unprotected sex   Musculoskeletal:       Rash x 2 months that has never fully healed.     Objective:  Physical Exam: Filed Vitals:   08/14/15 1044  BP: 129/65  Pulse: 76  Temp: 98.3 F (36.8 C)  TempSrc: Oral  Height: 6' (1.829 m)  Weight: 215 lb 9.6 oz (97.796 kg)  SpO2: 99%   Physical Exam  Constitutional: He appears well-developed and well-nourished. No distress.  Cardiovascular: Normal rate, regular rhythm and normal heart sounds.  Exam reveals no gallop and no friction rub.   No murmur heard. Pulmonary/Chest: Effort normal and breath sounds normal. No respiratory distress. He  has no wheezes. He has no rales. He exhibits no tenderness.  Abdominal: Soft. Bowel sounds are normal. He exhibits no distension and no mass. There is no tenderness. There is no rebound and no guarding.  Musculoskeletal: He exhibits no edema.  Skin: Skin is warm and dry. Rash (multiple 2-3 mm lesions on all extemities and lower trunk at varying stages of healing, three 2mm flat dark macules on rt side of penile shaft, nonspecific erythema of pubic region w/o any raised lesions. ) noted. He is not diaphoretic.   Assessment & Plan:   Please see problem based assessment and plan.

## 2015-08-14 NOTE — Patient Instructions (Signed)
Take 6 tabs of ivermectin, then next week take another 6 tabs.   Scabies, Adult Scabies is a skin condition that happens when very small insects get under the skin (infestation). This causes a rash and severe itchiness. Scabies can spread from person to person (is contagious). If you get scabies, it is common for others in your household to get scabies too. With proper treatment, symptoms usually go away in 2-4 weeks. Scabies usually does not cause lasting problems. CAUSES This condition is caused by mites (Sarcoptes scabiei, or human itch mites) that can only be seen with a microscope. The mites get into the top layer of skin and lay eggs. Scabies can spread from person to person through:  Close contact with a person who has scabies.  Contact with infested items, such as towels, bedding, or clothing. RISK FACTORS This condition is more likely to develop in:  People who live in nursing homes and other extended-care facilities.  People who have sexual contact with a partner who has scabies.  Young children who attend child care facilities.  People who care for others who are at increased risk for scabies. SYMPTOMS Symptoms of this condition may include:  Severe itchiness. This is often worse at night.  A rash that includes tiny red bumps or blisters. The rash commonly occurs on the wrist, elbow, armpit, fingers, waist, groin, or buttocks. Bumps may form a line (burrow) in some areas.  Skin irritation. This can include scaly patches or sores. DIAGNOSIS This condition is diagnosed with a physical exam. Your health care provider will look closely at your skin. In some cases, your health care provider may take a sample of your affected skin (skin scraping) and have it examined under a microscope. TREATMENT This condition may be treated with:  Medicated cream or lotion that kills the mites. This is spread on the entire body and left on for several hours. Usually, one treatment with  medicated cream or lotion is enough to kill all of the mites. In severe cases, the treatment may be repeated.  Medicated cream that relieves itching.  Medicines that help to relieve itching.  Medicines that kill the mites. This treatment is rarely used. HOME CARE INSTRUCTIONS Medicines  Take or apply over-the-counter and prescription medicines as told by your health care provider.  Apply medicated cream or lotion as told by your health care provider.  Do not wash off the medicated cream or lotion until the necessary amount of time has passed. Skin Care  Avoid scratching your affected skin.  Keep your fingernails closely trimmed to reduce injury from scratching.  Take cool baths or apply cool washcloths to help reduce itching. General Instructions  Clean all items that you recently had contact with, including bedding, clothing, and furniture. Do this on the same day that your treatment starts.  Use hot water when you wash items.  Place unwashable items into closed, airtight plastic bags for at least 3 days. The mites cannot live for more than 3 days away from human skin.  Vacuum furniture and mattresses that you use.  Make sure that other people who may have been infested are examined by a health care provider. These include members of your household and anyone who may have had contact with infested items.  Keep all follow-up visits as told by your health care provider. This is important. SEEK MEDICAL CARE IF:  You have itching that does not go away after 4 weeks of treatment.  You continue to develop new  bumps or burrows.  You have redness, swelling, or pain in your rash area after treatment.  You have fluid, blood, or pus coming from your rash.   This information is not intended to replace advice given to you by your health care provider. Make sure you discuss any questions you have with your health care provider.   Document Released: 04/30/2015 Document Reviewed:  03/11/2015 Elsevier Interactive Patient Education 2016 [employer] Inc[employer]   Molluscum Contagiosum, Adult Molluscum contagiosum is a skin infection that can cause a rash. When molluscum contagiosum affects the genital area, it is called a sexually transmitted disease (STD). CAUSES Molluscum contagiosum is caused by a virus. The virus can spread easily from person to person through:  Skin-to-skin contact with an infected person.  Contact with an infected object, such as a towel or clothing. RISK FACTORS You may be at higher risk for molluscum contagiosum if you:  Live in an area where the weather is moist and warm.  Have a weak body defense system (immune system). SIGNS AND SYMPTOMS The main symptom is a rash that appears 2-7 weeks after exposure to the virus. It is made up of 2-20 small, firm, dome-shaped bumps that may:  Be pink or flesh-colored.  Appear alone or in groups.  Range from the size of a pinhead to the size of a pencil eraser.  Feel smooth and waxy.  Have a pit in the middle.  Itch. The rash does not itch for most people. The bumps often appears on the genitals, thighs, face, neck, and abdomen. DIAGNOSIS  A health care provider can usually diagnose molluscum contagiosum by looking at the bumps on your skin. To confirm the diagnosis, your health care provider may scrape the bumps to collect a skin sample to examine under a microscope. TREATMENT The bumps may go away on their own, but people often have treatment to keep the virus from infecting someone else or to keep the rash from spreading to other body parts. Treatment may include:  Surgery to remove the bumps by freezing them (cryosurgery).  A procedure to scrape off the bumps (curettage).  A procedure to remove the bumps with a laser.  Putting medicine on the bumps (topical treatment). Sometimes no treatment is needed.  HOME CARE INSTRUCTIONS  Take medicines only as directed by your health care  provider.  As long as you have bumps on your skin, the infection can spread to others and to other parts of your body. To prevent this from happening:  Do not scratch the bumps.  Do not share clothing or towels with others until the bumps disappear.   Avoid close contact with others until the bumps disappear. This includes sexual contact.  Wash your hands often.  Cover the bumps with clothing or a bandage when you will be near other people. SEEK MEDICAL CARE IF:  The bumps are spreading.  The bumps are becoming red and sore.  The bumps have not gone away after 12 months.   This information is not intended to replace advice given to you by your health care provider. Make sure you discuss any questions you have with your health care provider.   Document Released: 03/06/2014 Document Reviewed: 03/06/2014 Elsevier Interactive Patient Education [employer]

## 2015-08-15 LAB — HSV 2 ANTIBODY, IGG: HSV 2 Glycoprotein G Ab, IgG: 3.62 index — ABNORMAL HIGH (ref 0.00–0.90)

## 2015-08-15 LAB — HSV 1 ANTIBODY, IGG

## 2015-08-15 LAB — RPR: RPR: NONREACTIVE

## 2015-08-15 LAB — HIV ANTIBODY (ROUTINE TESTING W REFLEX): HIV SCREEN 4TH GENERATION: NONREACTIVE

## 2015-08-16 ENCOUNTER — Telehealth: Payer: Self-pay | Admitting: Internal Medicine

## 2015-08-16 DIAGNOSIS — R768 Other specified abnormal immunological findings in serum: Secondary | ICD-10-CM | POA: Insufficient documentation

## 2015-08-16 LAB — HIV-1 RNA QUANT-NO REFLEX-BLD

## 2015-08-16 LAB — FLUORESCENT TREPONEMAL AB(FTA)-IGG-BLD: Fluorescent Treponemal Ab, IgG: NONREACTIVE

## 2015-08-16 NOTE — Assessment & Plan Note (Signed)
Pt presents with a rash that started on all 4 extremities and lower trunk 2 months ago. It was difficult to get a good hx from patient as he was not forthcoming with information. He states rash started 2 months ago and never fully resolved with lesions healing and new lesions popping up. He lives at home with his mother who does not have a rash. Rash is itching and lesions would come to a head that he would scratch off. A solid white head would come off. He also had a pearly white blister on his penile shaft and was seen at urgent care in [facility] for this. Blister was non painful and he did not have any tingling prodrome prior to its appearance. The He gets cold sores on his mouth that are non painful. He was given valtrex at urgent care which help the blister go away and was told that it could have also been molluscum contagiosum. The blister was around 3mm in size per patient. He has not had any new sexual partners since the rash on his extremities began and has a remote hx of unprotected sex in the past but now uses condoms. He states his previous partner had HPV. On first examination pt has multiple 2-92mm lesions that look almost like bug bites that are hyperpigmented and at various stages of healing ( some are scabbed while other look freshly scratched off w/ 1mm red center). They are located on all 4 extremities and lower trunk. On his groin area he has erythema without any lesions. Then after the examination was over he reports that he has new spots on the right side of his penis that he is worried is from HPV from his previous partner. He noticed these spots yesterday. On re examination with RN he has 3 hyperpigmented flat macules on the rt side of his shaft that does not appear like verruca. With hx of painless blister on his penis syphilis is as concern. He states blister went away with valtrex treatment and he reports having low normal titers of HSV labs at urgent care thus herpes is another  consideration however he denies oozing or crusting of blister and it was painless. Could also be scabies however he does not have any frank signs of borrowing or aggregations of lesions between finger space webbing, groin near shaft, or wrist. His mother who lives with him also does not have any rash. Could also be mollusum as he states that he will scratch off a white solid body however lesions he currently has are flat without a dimple in the center. Also could be bug bites however will ordered STD labs and treat for scabies.   - pt given choice of permethrin vs oral ivermectin and he preferred po. Rx for ivermectin  once today then another 6 mg in 1-2 weeks for tx of scabies - checking HIV, HSV, ABS- FTA, and RPR which all came back negative except for HSV2. He does have hx of cold sores which could be the cause of the elevated HSV2 lab however oral herpes is usually caused by HSV1. This is most likely not a primary infxn w/ HSV as pt states he has a previous + HSV labs and the blister was asymptomatic. Will inform patient and continue to observe for future lesions and treat with acyclovir as appropriate.  - also informed pt that we are his primary care clinic and that he can make same day appointments with [place] if he ever does have urgent medical  issues, this will also save him money from copays at urgent care clinics.

## 2015-08-16 NOTE — Telephone Encounter (Signed)
Informed pt of +HSV2 test today. Pt reports rash and extremities is improving. Will f/u in 2 weeks to take a look at rash.  Gara Kronerruong, Karlisha Mathena, MD Internal Medicine Resident, PGY [facility] Pager: [phone]

## 2015-08-19 ENCOUNTER — Other Ambulatory Visit: Payer: Self-pay | Admitting: Internal Medicine

## 2015-08-19 ENCOUNTER — Telehealth: Payer: Self-pay | Admitting: Pharmacist

## 2015-08-19 DIAGNOSIS — H1012 Acute atopic conjunctivitis, left eye: Secondary | ICD-10-CM

## 2015-08-19 MED ORDER — CETIRIZINE HCL 10 MG PO TABS: 10.0000 mg | ORAL_TABLET | Freq: Every day | ORAL | Status: DC

## 2015-08-19 NOTE — Telephone Encounter (Signed)
Call to patient to confirm appointment for 08/20/15 at 3:00 lmtcb

## 2015-08-19 NOTE — Telephone Encounter (Signed)
Pt requesting zyrtec to be filled @ [facility].

## 2015-08-20 ENCOUNTER — Telehealth: Payer: Self-pay

## 2015-08-20 ENCOUNTER — Telehealth: Payer: Self-pay | Admitting: Internal Medicine

## 2015-08-20 ENCOUNTER — Ambulatory Visit (INDEPENDENT_AMBULATORY_CARE_PROVIDER_SITE_OTHER): Payer: BLUE CROSS/BLUE SHIELD | Admitting: Pharmacist

## 2015-08-20 ENCOUNTER — Ambulatory Visit (INDEPENDENT_AMBULATORY_CARE_PROVIDER_SITE_OTHER): Payer: BLUE CROSS/BLUE SHIELD | Admitting: Internal Medicine

## 2015-08-20 ENCOUNTER — Encounter: Payer: Self-pay | Admitting: Internal Medicine

## 2015-08-20 VITALS — BP 132/86 | HR 87 | Temp 98.4°F | Resp 18 | Ht 72.0 in | Wt 210.4 lb

## 2015-08-20 DIAGNOSIS — R202 Paresthesia of skin: Secondary | ICD-10-CM | POA: Diagnosis not present

## 2015-08-20 DIAGNOSIS — R894 Abnormal immunological findings in specimens from other organs, systems and tissues: Secondary | ICD-10-CM

## 2015-08-20 DIAGNOSIS — Z7901 Long term (current) use of anticoagulants: Secondary | ICD-10-CM

## 2015-08-20 DIAGNOSIS — R768 Other specified abnormal immunological findings in serum: Secondary | ICD-10-CM

## 2015-08-20 DIAGNOSIS — I2699 Other pulmonary embolism without acute cor pulmonale: Secondary | ICD-10-CM | POA: Diagnosis not present

## 2015-08-20 LAB — POCT INR: INR: 2.2

## 2015-08-20 MED ORDER — VALACYCLOVIR HCL 500 MG PO TABS: 500.0000 mg | ORAL_TABLET | Freq: Two times a day (BID) | ORAL | Status: AC

## 2015-08-20 MED ORDER — VALACYCLOVIR HCL 500 MG PO TABS: 500.0000 mg | ORAL_TABLET | Freq: Two times a day (BID) | ORAL | Status: DC

## 2015-08-20 MED ORDER — WARFARIN SODIUM 5 MG PO TABS: 5.0000 mg | ORAL_TABLET | Freq: Every day | ORAL | Status: DC

## 2015-08-20 NOTE — Telephone Encounter (Signed)
Pt called - no answer; left message he needs to schedule an appt to be seen by a doctor before anyone can prescribe any medication. And to call if he has any questions.

## 2015-08-20 NOTE — Telephone Encounter (Signed)
Per note by Dr. Danella Pentonruong, patient reported 3 days ago that rash was improving.  Is it now worsening?  If continuing to get better, no reason to start valtrex.  Also, she is due to come in today at 3pm for labs, if rash is worsening should probably be seen by an MD before starting new medication.

## 2015-08-20 NOTE — Addendum Note (Signed)
Addended by: Debe CoderMULLEN, Samentha Perham B on: 08/20/2015 03:22 PM   Modules accepted: Orders

## 2015-08-20 NOTE — Telephone Encounter (Signed)
Reviewed notes. Not clear that this was the treatment plan based on Dr. Roxan Hockeyruong's note.  I saw Richard [name] with Richard [name] at his clinic appointment.  I am not convinced that he has herpes genitalis.  He reports that he had bullous lesions on his penis and bilateral legs and groin which were not painful but were itchy.  [place] [facility] [place] and was told he had herpes genitalis.  IgG was positive by his report and they did a swab of one of the lesions.  We do not have the results of this swab.  He reports his "bumps" improved, but now he has recurrence of a pain in his upper legs and back which he thinks is the herpes recurring.  He is insistent that Richard [name] told him that all he had to do was show up in clinic to get more Valtrex.  Given the low liklihood of side effect from this medication and that we do not have any appointments, will do the following.   Valtrex 500mg  BID X 3 days for recurrent outbreak Contact [facility] when possible to get records (will need exact location from patient) Any further rash or pending outbreak symptoms he experiences, he should call the clinic to be seen by a physician.  We will need confirmation that this rash is actually herpes genitalis prior to any further treatment.    He is apparently refusing appointment for tomorrow as he cannot get off of work.    Thank you for your help Richard [name].

## 2015-08-20 NOTE — Telephone Encounter (Signed)
Pt requesting valtrex to be filled @ [facility] in [place].

## 2015-08-20 NOTE — Progress Notes (Signed)
Anticoagulation Management Richard BirksMicha Normand [name] is a 39 y.o. male who reports to the clinic for monitoring of warfarin treatment.    Indication: DVT and PEhistory Duration: indefinite  Anticoagulation Clinic Visit History: Patient does not report signs/symptoms of bleeding or thromboembolism   Anticoagulation Episode Summary    Current INR goal 2.0-3.0  Next INR check 09/15/2015  INR from last check 2.2 (08/20/2015)  Weekly max dose   Target end date Indefinite  INR check location [facility]  Preferred lab   Send INR reminders to    Indications  Pulmonary embolism and infarction (HCC) [I26.99] D V T [I80.299] Long term (current) use of anticoagulants [Z79.01]        Comments        ASSESSMENT Recent Results: Recent results are below, the most recent result is correlated with a dose of 42.5 mg per week: Lab Results  Component Value Date   INR 2.2 08/20/2015   INR 1.9 07/22/2015   INR 3.5 07/07/2015   PROTIME 20.4* 03/16/2011   INR today: Therapeutic  Anticoagulation Dosing: INR as of 08/20/2015 and Previous Dosing Information    INR Dt INR Goal [facility] Tot [name]   08/20/2015 2.2 2.0-3.0 42.5 mg 7.5 mg 5 mg 5 mg 5 mg 7.5 mg 5 mg 7.5 mg    Anticoagulation Dose Instructions as of 08/20/2015      Total [name]   New Dose 42.5 mg 7.5 mg 5 mg 5 mg 5 mg 7.5 mg 5 mg 7.5 mg     (5 mg x 1.5)  (5 mg x 1)  (5 mg x 1)  (5 mg x 1)  (5 mg x 1.5)  (5 mg x 1)  (5 mg x 1.5)                           PLAN Weekly dose was unchanged   Patient Instructions  Patient educated about medication as defined in this encounter and verbalized understanding by repeating back instructions provided.   Follow-up Return in about 4 weeks (around 09/15/2015) for Follow up INR on 09/15/15 at 3pm.  Kim,Jennifer J  15 minutes spent face-to-face with the patient during the encounter. 50% of time spent on education. 50% of time was spent on assessment and  plan.

## 2015-08-20 NOTE — Telephone Encounter (Signed)
Pt walked into clinic with request for a refill on his Valtrex.  States he rec'd a call from Dr. Danella Pentonruong over the weekend and was told to call or present if hs symptoms of herpes return and we would give him a refill.  Pt denies any bumps at the moment but "feels like an outbreak is coming because of pain to his thighs and lower back" with occasional pains to his genital area.  Pt is not receptive to be added to the schedule to be seen tomorrow for formal evaluation.  Pt references an appointment with doctor in [place] where he was originally given the rx for Valtrex and was "swabbed" and had a positive Igg test.

## 2015-08-20 NOTE — Telephone Encounter (Signed)
Stated he had talked to Dr Danella Pentonruong on Saturday and if had another out break, she would prescribe Valtrex. He had taken this med before but under another doctor.

## 2015-08-20 NOTE — Patient Instructions (Signed)
Patient educated about medication as defined in this encounter and verbalized understanding by repeating back instructions provided.   

## 2015-08-20 NOTE — Progress Notes (Signed)
Subjective:    Patient ID: Richard GandyMicha [name], male    DOB: [birth year], 39 y.o.   MRN: [id number]  HPI Comments: Richard [name] is a 39 year old male with PMH as below here for follow-up of possible HSV infection.  No new STD concern and says rash has resolved but has new c/o of tingling at tip of penis and B/L thighs.  Otherwise, no fever, chills, genital ulcers, itching or dysuria.  He seems anxious about recent HSV2 serum + results and says he does not understand why serum HSV2 was 1.56 when checked in [place] but is 3.62 at our office.      Past Medical History  Diagnosis Date  . DVT (deep venous thrombosis) (HCC)   . Seasonal allergies    Current Outpatient Prescriptions on File Prior to Visit  Medication Sig Dispense Refill  . cetirizine (ZYRTEC) 10 MG tablet Take 1 tablet (10 mg total) by mouth daily. 100 tablet 0  . valACYclovir (VALTREX) 500 MG tablet Take 1 tablet (500 mg total) by mouth 2 (two) times daily. 6 tablet 0  . warfarin (COUMADIN) 5 MG tablet Take 1 tablet (5 mg total) by mouth daily. Except take 1.5 tablets (7.5 mg) on Thursdays, Saturdays, and Sundays 35 tablet 3   No current facility-administered medications on file prior to visit.    Review of Systems  Constitutional: Negative for fever, chills, appetite change and unexpected weight change.  HENT: Positive for sore throat. Negative for congestion, postnasal drip, rhinorrhea and sneezing.   Respiratory: Positive for cough. Negative for shortness of breath.        Slight cough started today.  Cardiovascular: Negative for chest pain, palpitations and leg swelling.  Gastrointestinal: Negative for nausea and vomiting.  Genitourinary: Negative for dysuria, discharge and genital sores.  Musculoskeletal: Negative for back pain.  Skin: Negative for rash.  Neurological: Negative for weakness.       Burning/tingling B/L legs.         Filed Vitals:   08/20/15 1545  BP: 132/86  Pulse: 87  Temp: 98.4 F (36.9 C)    TempSrc: Oral  Resp: 18  Height: 6' (1.829 m)  Weight: 210 lb 6.4 oz (95.437 kg)  SpO2: 100%     Objective:   Physical Exam  Constitutional: He is oriented to person, place, and time. He appears well-developed. No distress.  HENT:  Head: Normocephalic and atraumatic.  Mouth/Throat: Oropharynx is clear and moist. No oropharyngeal exudate.  Eyes: Conjunctivae and EOM are normal. Pupils are equal, round, and reactive to light. Right eye exhibits no discharge. No scleral icterus.  Cardiovascular: Normal rate, regular rhythm and normal heart sounds.  Exam reveals no gallop and no friction rub.   No murmur heard. Pulmonary/Chest: Breath sounds normal. No respiratory distress. He has no wheezes. He has no rales.  Abdominal: Soft. Bowel sounds are normal. He exhibits no distension and no mass. There is no tenderness. There is no rebound and no guarding.  Genitourinary:  GU deferred.   Musculoskeletal: Normal range of motion. He exhibits no edema or tenderness.  Lymphadenopathy:    He has no cervical adenopathy.  Neurological: He is alert and oriented to person, place, and time. No cranial nerve deficit.  Skin: Skin is warm. No rash noted. He is not diaphoretic.  Small red papule on his thigh that looks like insect bite or follicle.  No vesicular lesions on the legs.   Psychiatric: His behavior is normal.  Vitals reviewed.  Assessment & Plan:  Please see problem based charting for A&P.

## 2015-08-20 NOTE — Progress Notes (Signed)
Internal Medicine Clinic Attending  I saw and evaluated the patient.  I personally confirmed the key portions of the history and exam documented by Dr. Truong and I reviewed pertinent patient test results.  The assessment, diagnosis, and plan were formulated together and I agree with the documentation in the resident's note.  

## 2015-08-21 LAB — CMP14 + ANION GAP
ALBUMIN: 4.5 g/dL (ref 3.5–5.5)
ALT: 22 IU/L (ref 0–44)
ANION GAP: 21 mmol/L — AB (ref 10.0–18.0)
AST: 27 IU/L (ref 0–40)
Albumin/Globulin Ratio: 2.1 (ref 1.1–2.5)
Alkaline Phosphatase: 55 IU/L (ref 39–117)
BUN / CREAT RATIO: 17 (ref 8–19)
BUN: 21 mg/dL — ABNORMAL HIGH (ref 6–20)
Bilirubin Total: 0.4 mg/dL (ref 0.0–1.2)
CALCIUM: 9.2 mg/dL (ref 8.7–10.2)
CO2: 19 mmol/L (ref 18–29)
CREATININE: 1.24 mg/dL (ref 0.76–1.27)
Chloride: 102 mmol/L (ref 96–106)
GFR calc Af Amer: 85 mL/min/{1.73_m2} (ref >59)
GFR, EST NON AFRICAN AMERICAN: 73 mL/min/{1.73_m2} (ref >59)
Globulin, Total: 2.1 g/dL (ref 1.5–4.5)
Glucose: 101 mg/dL — ABNORMAL HIGH (ref 65–99)
Potassium: 4.1 mmol/L (ref 3.5–5.2)
SODIUM: 142 mmol/L (ref 134–144)
Total Protein: 6.6 g/dL (ref 6.0–8.5)

## 2015-08-21 LAB — VITAMIN B12: Vitamin B-12: 354 pg/mL (ref 211–946)

## 2015-08-21 LAB — TSH: TSH: 1.57 u[IU]/mL (ref 0.450–4.500)

## 2015-08-21 NOTE — Telephone Encounter (Signed)
Pt was seen yesterday by Dr Andrey CampanileWilson.

## 2015-08-21 NOTE — Assessment & Plan Note (Addendum)
Assessment:  Rash has resolved but patient seem anxious about lab results.  Results from [place] have been reviewed and reveal serum HSV1 neg, serum HSV2 pos, HSV culture neg.  I explained to the patient that the blood test is not the best test for acute HSV and may just indicate prior exposure and not truly be the cause of the rash/lesions he had last week, especially with culture negative.  He feels symptoms did improve with recent Valtrex rx.  He is reporting vague tingling of thighs and at the urethral meatus that started today.  I explained that their may be tingling prior to an HSV outbreak but that HSV usually prefers mucous membranes like oral mucosa and genitalia. Apparently, there may tingling in buttocks, thighs during prodrome as well.  An rx for course of valtrex has already been provided by another provider prior to him getting this acute visit today so I advised that he can take it just in case his genital tingling is a prodrome of a impending recurrence. Plan: Valtrex 500mg  BID x 3 days.  He will return to clinic if symptoms persists or if he does develop lesion so cultures can be sent.  Advised on condom use.

## 2015-08-28 NOTE — Progress Notes (Signed)
I have reviewed Dr. Kim's note.  

## 2015-08-28 NOTE — Progress Notes (Signed)
Internal Medicine Clinic Attending  Case discussed with Dr. Wilson at the time of the visit.  We reviewed the resident's history and exam and pertinent patient test results.  I agree with the assessment, diagnosis, and plan of care documented in the resident's note.  

## 2015-09-01 ENCOUNTER — Encounter: Payer: Self-pay | Admitting: Internal Medicine

## 2015-09-01 ENCOUNTER — Ambulatory Visit (INDEPENDENT_AMBULATORY_CARE_PROVIDER_SITE_OTHER): Payer: BLUE CROSS/BLUE SHIELD | Admitting: Internal Medicine

## 2015-09-01 ENCOUNTER — Telehealth: Payer: Self-pay | Admitting: *Deleted

## 2015-09-01 VITALS — BP 138/71 | HR 95 | Temp 97.9°F | Ht 72.0 in | Wt 213.0 lb

## 2015-09-01 DIAGNOSIS — G629 Polyneuropathy, unspecified: Secondary | ICD-10-CM | POA: Insufficient documentation

## 2015-09-01 DIAGNOSIS — R202 Paresthesia of skin: Secondary | ICD-10-CM

## 2015-09-01 NOTE — Progress Notes (Signed)
Patient ID: Richard [name], male   DOB: 10-23-1975, 40 y.o.   MRN: [id number] [facility] Subjective:   Patient ID: Richard [name] male   DOB: Jun 04, 1976 40 y.o.   MRN: [id number]  HPI: Richard [name] is a 40 y.o. male with a history of a pulmonary embolus currently being treated with warfarin, seasonal allergies, and genital herpes simplex presenting to clinic with intermittent bilateral inner thigh paresthesias.  For the last 3 weeks, about one week after stopping his valacyclovir for genital herpes, he has had intermittent paresthesias in his bilateral inner thighs that come and go randomly. Since being treated with the valacyclovir, his genital herpes lesions have completely resolved but he now is experiencing this odd tingling sensation. He does not have any lesions on his inner thighs. He also denies any back pain or injury, bowel or bladder incontinence, leg weakness, claudication-like symptoms in his buttocks, or impotence. He has been gaining some weight, but his belts do not fit more tightly, and he does not have tingling on his outer thighs.  I reviewed his medications and he is currently not smoking.  Review of Systems  Constitutional: Negative for fever, chills and weight loss.  HENT: Negative for congestion.   Eyes: Negative for blurred vision and double vision.  Respiratory: Negative for hemoptysis, sputum production and shortness of breath.   Cardiovascular: Negative for chest pain, palpitations and leg swelling.  Gastrointestinal: Negative for nausea, vomiting, abdominal pain and diarrhea.  Genitourinary: Negative for dysuria and urgency.  Musculoskeletal: Negative for myalgias and joint pain.  Skin: Negative for itching and rash.  Neurological: Positive for tingling. Negative for dizziness, sensory change, focal weakness and headaches.  Psychiatric/Behavioral: Negative for depression.    Objective:  Physical Exam: Filed Vitals:   09/01/15 1409   BP: 138/71  Pulse: 95  Temp: 97.9 F (36.6 C)  TempSrc: Oral  Height: 6' (1.829 m)  Weight: 96.616 kg (213 lb)  SpO2: 98%   General: resting in bed comfortably, appropriately conversational, with a reserved affect HEENT: no scleral icterus, extra-ocular muscles intact, oropharynx without lesions Cardiac: regular rate and rhythm, 3/6 systolic murmur loudest at the right upper sternal border with radiation into the carotids, within normal S1 and S2, without S3 or S4. Normal PMI just below the left areola. No JVD. Radial pulses 2+ bilaterally. Pulm: breathing well, clear to auscultation bilaterally Abd: bowel sounds normal, soft, nondistended, non-tender Ext: warm and well perfused, without pedal edema Lymph: no cervical or supraclavicular lymphadenopathy Skin: Digital clubbing on both hands and feet, diffuse xerosis, but no other skin, hair, or nail changes Neuro: alert and oriented X3, cranial nerves II-XII grossly intact, sensation of inner thighs intact to light touch and pinprick through all dermatomes, patellar reflexes 2+, strength 5/5 in bilateral lower legs, moving all extremities well   Assessment & Plan:  Case discussed with Dr. Josem Kaufmann  Acquired polyneuropathy [facility]) Richard [name] is here for evaluation of bilateral inner thigh paresthesias that I believe are most likely due to neuropathy from his recently treated genital herpes, but the diagnosis is not clear-cut. He denies any back pain or injuries to suspect a spinal cord injury or mass. He does have digital clubbing, so I considered an occult malignancy-induced sensory neuropathy, but he tells me he has had a clubbing his whole life, and his family members do as well, he doesn't have any localizing signs of cancer or lymphadenopathy on exam to suggest malignancy. He also denies any rashes, joint pains,  arthralgias, or history of autoimmune diseases suggestive of lupus, Sjogren's, or rheumatoid arthritis. He doesn't spend much time in  the woods, and has never had a tick bite or rash, so I don't think this is Lyme's. Also, I don't think this is meralgia paresthetica because the distribution is on the inner, not the outer thighs. Nor do I think this is Leriche syndrome, as he denies claudication symptoms or impotence, and does not have any other known vasculopathy such as coronary artery disease, claudication, or strokes.  All in all, the differential for his polyneuropathy is broad but the temporal relationship between his herpes and onset of neuropathy lead me to believe this is somehow neuralgia caused by his herpes. Fortunately, this is not debilitating for him. He was okay with a watchful waiting approach for now. If his paresthesias become more painful and/or persistent, we could consider an EMG, autoimmune panel, B12 level, and Lyme titers.   Follow Up: Return in about 4 weeks (around 09/29/2015), or follow-up thigh paresthesias.

## 2015-09-01 NOTE — Telephone Encounter (Signed)
For appr 2 weeks back of pt's legs bilat has been tingling, he has regular movement, regular sensation but an additional tingling feeling to the back of his legs he also has pain in mid thigh at these times at about a 5 on pain scale, achiness, no burning, stabbing. Denies chest pain, h/a, shortness of breath. R leg seems worse than L. Nothing helps, nothing makes it worse. Has not tried any meds or activities to see if he can decrease the tingling. He would like to be seen asap. appt dr Earnest Conroyflores 1/9 at 1545

## 2015-09-01 NOTE — Assessment & Plan Note (Signed)
[name] is here for evaluation of bilateral inner thigh paresthesias that I believe are most likely due to neuropathy from his recently treated genital herpes, but the diagnosis is not clear-cut. He denies any back pain or injuries to suspect a spinal cord injury or mass. He does have digital clubbing, so I considered an occult malignancy-induced sensory neuropathy, but he tells me he has had a clubbing his whole life, and his family members do as well, he doesn't have any localizing signs of cancer or lymphadenopathy on exam to suggest malignancy. He also denies any rashes, joint pains, arthralgias, or history of autoimmune diseases suggestive of lupus, Sjogren's, or rheumatoid arthritis. He doesn't spend much time in the woods, and has never had a tick bite or rash, so I don't think this is Lyme's. Also, I don't think this is meralgia paresthetica because the distribution is on the inner, not the outer thighs. Nor do I think this is Leriche syndrome, as he denies claudication symptoms or impotence, and does not have any other known vasculopathy such as coronary artery disease, claudication, or strokes.  All in all, the differential for his polyneuropathy is broad but the temporal relationship between his herpes and onset of neuropathy lead me to believe this is somehow neuralgia caused by his herpes. Fortunately, this is not debilitating for him. He was okay with a watchful waiting approach for now. If his paresthesias become more painful and/or persistent, we could consider an EMG, autoimmune panel, B12 level, and Lyme titers.

## 2015-09-01 NOTE — Patient Instructions (Signed)
[name],  It was a pleasure to meet you today.  The tingling on her inner thigh seems most likely to be related to your herpes infection in the past. I suspect this will get better on its own, but if it starts hurting, or begins bothering you even more, or if he started having blisters on this area, don't hesitate to give us a call to be seen again.  Also, I heard a murmur on your heart that is very common in lots of people. If he started feeling chest pain, you faint, or feel very short of breath when you walk, again, please give us a call to come be seen in our clinic. I'll tell Dr. Danella Pentonruong about this as well.  Take care, and had a great day, Dr. Earnest ConroyFlores

## 2015-09-01 NOTE — Progress Notes (Signed)
Case discussed with Dr. Flores at the time of the visit. We reviewed the resident's history and exam and pertinent patient test results. I agree with the assessment, diagnosis, and plan of care documented in the resident's note. 
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
## 2015-09-15 ENCOUNTER — Ambulatory Visit (INDEPENDENT_AMBULATORY_CARE_PROVIDER_SITE_OTHER): Payer: BLUE CROSS/BLUE SHIELD | Admitting: Internal Medicine

## 2015-09-15 ENCOUNTER — Ambulatory Visit (INDEPENDENT_AMBULATORY_CARE_PROVIDER_SITE_OTHER): Payer: BLUE CROSS/BLUE SHIELD | Admitting: Pharmacist

## 2015-09-15 ENCOUNTER — Encounter: Payer: Self-pay | Admitting: Internal Medicine

## 2015-09-15 VITALS — BP 127/71 | HR 95 | Temp 98.2°F | Ht 72.0 in | Wt 211.4 lb

## 2015-09-15 DIAGNOSIS — Z8619 Personal history of other infectious and parasitic diseases: Secondary | ICD-10-CM | POA: Diagnosis not present

## 2015-09-15 DIAGNOSIS — Z7901 Long term (current) use of anticoagulants: Secondary | ICD-10-CM | POA: Diagnosis not present

## 2015-09-15 DIAGNOSIS — G629 Polyneuropathy, unspecified: Secondary | ICD-10-CM

## 2015-09-15 DIAGNOSIS — R768 Other specified abnormal immunological findings in serum: Secondary | ICD-10-CM

## 2015-09-15 DIAGNOSIS — N509 Disorder of male genital organs, unspecified: Secondary | ICD-10-CM

## 2015-09-15 DIAGNOSIS — I2699 Other pulmonary embolism without acute cor pulmonale: Secondary | ICD-10-CM | POA: Diagnosis not present

## 2015-09-15 DIAGNOSIS — R894 Abnormal immunological findings in specimens from other organs, systems and tissues: Secondary | ICD-10-CM

## 2015-09-15 DIAGNOSIS — A6000 Herpesviral infection of urogenital system, unspecified: Secondary | ICD-10-CM

## 2015-09-15 LAB — POCT INR: INR: 1.4

## 2015-09-15 MED ORDER — VALACYCLOVIR HCL 500 MG PO TABS: 500.0000 mg | ORAL_TABLET | Freq: Two times a day (BID) | ORAL | Status: DC

## 2015-09-15 NOTE — Progress Notes (Signed)
[facility] Subjective:   Patient ID: Richard [name] male   DOB: [birth year] 40 y.o.   MRN: [id number]  HPI: [name] is a 40 y.o. male with a PMH detailed below who presents for penile lesions.  He was seen about a month ago after being Dx with Genital herpes.  At that time he had an IgG Ab for HSV2 positive and other STD testing negative.  He was prescribed a course of Valrex whic hhe completed.  He was then seen for follow up about 2 weeks later complaining of some numbness and tingling on his inner thighs that started 2 weeks prior.  He returns today reporting that about 2 weeks ago he again noticed some lesions at the tip of his penis, he describes these as small bumps, he does not think they were painful.  He did intermittently have some numbness and tingling in his inner thighs but not at the penis itself.  He did not appreciate any redness or drainage at the area.  He has not had any weakness.    Past Medical History  Diagnosis Date  . DVT (deep venous thrombosis) (HCC)   . Seasonal allergies    Current Outpatient Prescriptions  Medication Sig Dispense Refill  . cetirizine (ZYRTEC) 10 MG tablet Take 1 tablet (10 mg total) by mouth daily. 100 tablet 0  . warfarin (COUMADIN) 5 MG tablet Take 1 tablet (5 mg total) by mouth daily. Except take 1.5 tablets (7.5 mg) on Thursdays, Saturdays, and Sundays 35 tablet 3   No current facility-administered medications for this visit.   Family History  Problem Relation Age of Onset  . Other Mother   . Other Father   . Pulmonary embolism Maternal Uncle    Social History   Social History  . Marital Status: Single    Spouse Name: N/A  . Number of Children: N/A  . Years of Education: N/A   Social History Main Topics  . Smoking status: Former Smoker -- 1 years    Types: Cigarettes, Cigars    Quit date: 04/05/2012  . Smokeless tobacco: None  . Alcohol Use: No  . Drug Use: No  . Sexual Activity: Not Asked    Other Topics Concern  . None   Social History Narrative   Review of Systems: Review of Systems  Constitutional: Negative for fever, chills, weight loss and malaise/fatigue.  Gastrointestinal: Negative for nausea, vomiting and abdominal pain.  Musculoskeletal: Negative for back pain.  Neurological: Positive for tingling and sensory change. Negative for dizziness, focal weakness and weakness.  Psychiatric/Behavioral: Negative for substance abuse.     Objective:  Physical Exam: Filed Vitals:   09/15/15 1534  BP: 127/71  Pulse: 95  Temp: 98.2 F (36.8 C)  TempSrc: Oral  Height: 6' (1.829 m)  Weight: 211 lb 6.4 oz (95.89 kg)  SpO2: 98%   Physical Exam  Constitutional: He is well-developed, well-nourished, and in no distress.  Genitourinary: Penis exhibits lesions (healing minor lesion at anterior corona of penis, additional healing lesion on anterior glans of penis, no obvious vesicle but may be in healing phase). No discharge found.  Neurological:  Reflex Scores:      Patellar reflexes are 2+ on the right side and 2+ on the left side. Normal sensation to light touch of inner thighs bilaterally  Nursing note and vitals reviewed.   Assessment & Plan:  Case discussed with Dr. [name]  Seropositive for herpes simplex 2 infection A: Genital herpes/ seropositive  for HSV2  P: Patient appears to have healing lesions on the glans of his penis which may reflect an HSV outbreak especially given his HSV-2 ab.  He previously was responsive to Valacyclovir and I will prescribe him Valtrex  BID for 3-5 days,  I have also given him refills to take for episodic therapy. - I am somewhat confused about his inner thigh numbness but it appears to be related to the probable HSV outbreak.    Acquired polyneuropathy (HCC) A; Acquired polyneuropathy  P: His neuropathy is somewhat confusion and appears related to probably HSV outbreak.  We have discussed treatment of HSV and monitoring of  the neuropathy as he has no concerning features.  We will follow up in 2-4 weeks per Dr Earnest Conroy recommendation. - Of Note RPR and HIV are negative.    Medications Ordered Meds ordered this encounter  Medications  . valACYclovir (VALTREX) 500 MG tablet    Sig: Take 1 tablet (500 mg total) by mouth 2 (two) times daily.    Dispense:  10 tablet    Refill:  3   Other Orders No orders of the defined types were placed in this encounter.   Follow Up: Return in about 1 month (around 10/16/2015).

## 2015-09-15 NOTE — Assessment & Plan Note (Signed)
A; Acquired polyneuropathy  P: His neuropathy is somewhat confusion and appears related to probably HSV outbreak.  We have discussed treatment of HSV and monitoring of the neuropathy as he has no concerning features.  We will follow up in 2-4 weeks per Dr Earnest Conroy recommendation. - Of Note RPR and HIV are negative.

## 2015-09-15 NOTE — Assessment & Plan Note (Signed)
A: Genital herpes/ seropositive for HSV2  P: Patient appears to have healing lesions on the glans of his penis which may reflect an HSV outbreak especially given his HSV-2 ab.  He previously was responsive to Valacyclovir and I will prescribe him Valtrex  BID for 3-5 days,  I have also given him refills to take for episodic therapy. - I am somewhat confused about his inner thigh numbness but it appears to be related to the probable HSV outbreak.

## 2015-09-15 NOTE — Patient Instructions (Signed)
Patient educated about medication as defined in this encounter and verbalized understanding by repeating back instructions provided.   

## 2015-09-15 NOTE — Progress Notes (Signed)
Anticoagulation Management Richard [name] is a 40 y.o. male who reports to the clinic for monitoring of warfarin treatment.    Indication: DVT and PE Duration: indefinite  Anticoagulation Clinic Visit History: Patient does not report signs/symptoms of bleeding or thromboembolism. Notably, patient completed therapy with ivermectin on 08/20/15 which can increase effects of warfarin. It is possible that now being off of the ivermectin could result in subtherapeutic INR. Re-started patient on a dose that was therapeutic prior to ivermectin therapy.   Anticoagulation Episode Summary    Current INR goal 2.0-3.0  Next INR check 09/29/2015  INR from last check 1.4! (09/15/2015)  Weekly max dose   Target end date Indefinite  [facility]  Preferred lab   Send INR reminders to    Indications  Pulmonary embolism and infarction (HCC) [I26.99] D V T [I80.299] Long term (current) use of anticoagulants [Z79.01]        Comments        ASSESSMENT Recent Results: Recent results are below, the most recent result is correlated with a dose of 42.5 mg per week: Lab Results  Component Value Date   INR 1.4 09/15/2015   INR 2.2 08/20/2015   INR 1.9 07/22/2015   PROTIME 20.4* 03/16/2011   INR today: Subtherapeutic  Anticoagulation Dosing: INR as of 09/15/2015 and Previous Dosing Information    INR Dt INR Goal [facility] [name]   09/15/2015 1.4 2.0-3.0 42.5 mg 7.5 mg 5 mg 5 mg 5 mg 7.5 mg 5 mg 7.5 mg    Anticoagulation Dose Instructions as of 09/15/2015      Total [name]   New Dose 45 mg 7.5 mg 5 mg 7.5 mg 5 mg 7.5 mg 5 mg 7.5 mg     (5 mg x 1.5)  (5 mg x 1)  (5 mg x 1.5)  (5 mg x 1)  (5 mg x 1.5)  (5 mg x 1)  (5 mg x 1.5)                           PLAN Weekly dose was increased by 6%   Patient Instructions  Patient educated about medication as defined in this encounter and verbalized understanding by repeating back  instructions provided.   Patient advised to contact clinic or seek medical attention if signs/symptoms of bleeding or thromboembolism occur.  Follow-up Return in about 2 weeks (around 09/29/2015) for Follow up 09/29/15 at 4pm.  Kim,Jennifer J

## 2015-09-15 NOTE — Patient Instructions (Signed)
General Instructions:  I want you to take Valtrex  twice a day for at least 3 days to see if the bumps to go away.  Please return in a month to follow up your numbness of you thighs.  Please bring your medicines with you each time you come to clinic.  Medicines may include prescription medications, over-the-counter medications, herbal remedies, eye drops, vitamins, or other pills.   Progress Toward Treatment Goals:  No flowsheet data found.  Self Care Goals & Plans:  No flowsheet data found.  No flowsheet data found.   Care Management & Community Referrals:  Referral 08/20/2015  Referrals made to community resources immunization information

## 2015-09-16 NOTE — Progress Notes (Signed)
Indication: Recurrent venous thromboembolism. Duration: Lifelong. INR: Below target. Agree with Dr. Elmyra Ricks assessment and plan.

## 2015-09-17 NOTE — Progress Notes (Signed)
Internal Medicine Clinic Attending  Case discussed with Dr. Hoffman at the time of the visit.  We reviewed the resident's history and exam and pertinent patient test results.  I agree with the assessment, diagnosis, and plan of care documented in the resident's note.  

## 2015-09-22 ENCOUNTER — Ambulatory Visit (INDEPENDENT_AMBULATORY_CARE_PROVIDER_SITE_OTHER): Payer: BLUE CROSS/BLUE SHIELD | Admitting: Pharmacist

## 2015-09-22 DIAGNOSIS — Z7901 Long term (current) use of anticoagulants: Secondary | ICD-10-CM

## 2015-09-22 DIAGNOSIS — I2699 Other pulmonary embolism without acute cor pulmonale: Secondary | ICD-10-CM

## 2015-09-22 LAB — POCT INR: INR: 3.8

## 2015-09-22 MED ORDER — WARFARIN SODIUM 5 MG PO TABS: ORAL_TABLET | ORAL | Status: DC

## 2015-09-22 NOTE — Progress Notes (Signed)
Anti-Coagulation Progress Note  Richard [name] is a 40 y.o. male who is currently on an anti-coagulation regimen.    RECENT RESULTS: Recent results are below, the most recent result is correlated with a dose of 45 mg. per week: Lab Results  Component Value Date   INR 3.80 09/22/2015   INR 1.4 09/15/2015   INR 2.2 08/20/2015   PROTIME 20.4* 03/16/2011    ANTI-COAG DOSE: Anticoagulation Dose Instructions as of 09/22/2015      [name] Tue Wed Thu Fri Sat   New Dose 5 mg 7.5 mg 5 mg 7.5 mg 5 mg 7.5 mg 5 mg    Description        OMIT today's dose (Monday, September 22, 2015)       ANTICOAG SUMMARY: Anticoagulation Episode Summary    Current INR goal 2.0-3.0  Next INR check 10/13/2015  INR from last check 3.80! (09/22/2015)  Weekly max dose   Target end date Indefinite  [facility]  Preferred lab   Send INR reminders to    Indications  Pulmonary embolism and infarction (HCC) [I26.99] D V T [I80.299] Long term (current) use of anticoagulants [Z79.01]        Comments         ANTICOAG TODAY: Anticoagulation Summary as of 09/22/2015    INR goal 2.0-3.0  Selected INR 3.80! (09/22/2015)  Next INR check 10/13/2015  Target end date Indefinite   Indications  Pulmonary embolism and infarction (HCC) [I26.99] D V T [I80.299] Long term (current) use of anticoagulants [Z79.01]      Anticoagulation Episode Summary    [facility]   Preferred lab    Send INR reminders to    Comments       PATIENT INSTRUCTIONS: Patient Instructions  Patient instructed to take medications as defined in the Anti-coagulation Track section of this encounter.  Patient instructed to OMIT today's dose.  Patient verbalized understanding of these instructions.       FOLLOW-UP Return in 3 weeks (on 10/13/2015) for Follow up INR at 4:30PM.  [name], III Pharm.D., CACP

## 2015-09-22 NOTE — Patient Instructions (Signed)
Patient instructed to take medications as defined in the Anti-coagulation Track section of this encounter.  Patient instructed to OMIT today's dose.  Patient verbalized understanding of these instructions.    

## 2015-09-24 NOTE — Progress Notes (Signed)
INTERNAL MEDICINE TEACHING ATTENDING ADDENDUM - Duncan Vincent M.D  Duration- indefinite, Indication- recurrent VTE, INR- supratherapeutic. Agree with pharmacy recommendations as outlined in their note.   

## 2015-09-29 ENCOUNTER — Ambulatory Visit: Payer: BLUE CROSS/BLUE SHIELD

## 2015-10-13 ENCOUNTER — Ambulatory Visit (INDEPENDENT_AMBULATORY_CARE_PROVIDER_SITE_OTHER): Payer: BLUE CROSS/BLUE SHIELD | Admitting: Pharmacist

## 2015-10-13 DIAGNOSIS — Z7901 Long term (current) use of anticoagulants: Secondary | ICD-10-CM | POA: Diagnosis not present

## 2015-10-13 DIAGNOSIS — I2699 Other pulmonary embolism without acute cor pulmonale: Secondary | ICD-10-CM | POA: Diagnosis not present

## 2015-10-13 LAB — POCT INR: INR: 2.6

## 2015-10-13 NOTE — Patient Instructions (Signed)
Patient instructed to take medications as defined in the Anti-coagulation Track section of this encounter.  Patient instructed to take today's dose.  Patient verbalized understanding of these instructions.    

## 2015-10-13 NOTE — Progress Notes (Signed)
Anti-Coagulation Progress Note  Richard [name] is a 40 y.o. male who is currently on an anti-coagulation regimen.    RECENT RESULTS: Recent results are below, the most recent result is correlated with a dose of 42.5 mg. per week: Lab Results  Component Value Date   INR 2.60 10/13/2015   INR 3.80 09/22/2015   INR 1.4 09/15/2015   PROTIME 20.4* 03/16/2011    ANTI-COAG DOSE: Anticoagulation Dose Instructions as of 10/13/2015      [name] Tue Wed Thu Fri Sat   New Dose 5 mg 7.5 mg 5 mg 7.5 mg 5 mg 7.5 mg 5 mg       ANTICOAG SUMMARY: Anticoagulation Episode Summary    Current INR goal 2.0-3.0  Next INR check 11/10/2015  INR from last check 2.60 (10/13/2015)  Weekly max dose   Target end date Indefinite  [facility]  Preferred lab   Send INR reminders to    Indications  Pulmonary embolism and infarction (HCC) [I26.99] D V T [I80.299] Long term (current) use of anticoagulants [Z79.01]        Comments         ANTICOAG TODAY: Anticoagulation Summary as of 10/13/2015    INR goal 2.0-3.0  Selected INR 2.60 (10/13/2015)  Next INR check 11/10/2015  Target end date Indefinite   Indications  Pulmonary embolism and infarction (HCC) [I26.99] D V T [I80.299] Long term (current) use of anticoagulants [Z79.01]      Anticoagulation Episode Summary    [facility]   Preferred lab    Send INR reminders to    Comments       PATIENT INSTRUCTIONS: Patient Instructions  Patient instructed to take medications as defined in the Anti-coagulation Track section of this encounter.  Patient instructed to take today's dose.  Patient verbalized understanding of these instructions.       FOLLOW-UP Return in 4 weeks (on 11/10/2015) for Follow up INR at 3:30PM.  [name], III Pharm.D., CACP

## 2015-10-14 NOTE — Progress Notes (Signed)
I have reviewed Dr. Saralyn Pilar note.  INR at goal. ON [facility] for PE/DVT.

## 2015-10-23 ENCOUNTER — Ambulatory Visit (INDEPENDENT_AMBULATORY_CARE_PROVIDER_SITE_OTHER): Payer: BLUE CROSS/BLUE SHIELD | Admitting: Internal Medicine

## 2015-10-23 ENCOUNTER — Encounter: Payer: Self-pay | Admitting: Internal Medicine

## 2015-10-23 VITALS — BP 133/74 | HR 85 | Temp 98.0°F | Wt 215.4 lb

## 2015-10-23 DIAGNOSIS — Z8669 Personal history of other diseases of the nervous system and sense organs: Secondary | ICD-10-CM | POA: Diagnosis not present

## 2015-10-23 DIAGNOSIS — B309 Viral conjunctivitis, unspecified: Secondary | ICD-10-CM

## 2015-10-23 DIAGNOSIS — G629 Polyneuropathy, unspecified: Secondary | ICD-10-CM

## 2015-10-23 NOTE — Progress Notes (Signed)
 [facility] Subjective:   Patient ID: Richard [name] male   DOB: 12-Mar-1976 40 y.o.   MRN: [id number]  HPI: [name] is a 40 y.o. male with a PMH detailed below who presents for an acute visit for eye redness.  He reports his left eye became red on Monday, 2 days later the same thing happened to his right eye.  He has no pain, no change in vision.  The eyes do feel "gritty" but otherwise it is not very bothersome.  He does not feel it has gotten any better.  He has had some clear discharge from both eyes  He denies any recent upper respiratory infection and denies any specific sick contacts.     Past Medical History  Diagnosis Date  . DVT (deep venous thrombosis) (HCC)   . Seasonal allergies    Current Outpatient Prescriptions  Medication Sig Dispense Refill  . cetirizine (ZYRTEC) 10 MG tablet Take 1 tablet (10 mg total) by mouth daily. 100 tablet 0  . warfarin (COUMADIN) 5 MG tablet Except take 1& 1/2 tablets (7.5 mg) on Mondays, Wednesdays and Fridays; 1 tablet ( ) on all other days. 35 tablet 3   No current facility-administered medications for this visit.   Family History  Problem Relation Age of Onset  . Other Mother   . Other Father   . Pulmonary embolism Maternal Uncle    Social History   Social History  . Marital Status: Single    Spouse Name: N/A  . Number of Children: N/A  . Years of Education: N/A   Social History Main Topics  . Smoking status: Former Smoker -- 1 years    Types: Cigarettes, Cigars    Quit date: 04/05/2012  . Smokeless tobacco: None  . Alcohol Use: No  . Drug Use: No  . Sexual Activity: Not Asked   Other Topics Concern  . None   Social History Narrative   Review of Systems: Review of Systems  Constitutional: Negative for fever, chills and malaise/fatigue.  Eyes: Positive for redness. Negative for blurred vision, double vision, photophobia, pain and discharge.  Respiratory: Negative for cough.    Neurological: Negative for headaches.     Objective:  Physical Exam: Filed Vitals:   10/23/15 1344  BP: 133/74  Pulse: 85  Temp: 98 F (36.7 C)  TempSrc: Oral  Weight: 215 lb 6.4 oz (97.705 kg)  SpO2: 99%   Physical Exam  Constitutional: He is well-developed, well-nourished, and in no distress.  HENT:  Head: Normocephalic and atraumatic.  Eyes: EOM are normal. Pupils are equal, round, and reactive to light. Right eye exhibits discharge. Right eye exhibits no exudate. Left eye exhibits discharge. Left eye exhibits no exudate. Right conjunctiva is injected. Left conjunctiva is injected.    Assessment & Plan:  Case discussed with Dr. Heide Spark  Acute viral conjunctivitis of both eyes A: Viral Conjunctivitis  P: Reassured patient that this will resolve with time and discussed OTC medications to assist with symptoms.  Acquired polyneuropathy (HCC) He reports he did not follow up as his numbness has resolved completely.  I suspect it was related to his previous HSV outbreak.    Medications Ordered No orders of the defined types were placed in this encounter.   Other Orders No orders of the defined types were placed in this encounter.   Follow Up: Return if symptoms worsen or fail to improve.

## 2015-10-23 NOTE — Assessment & Plan Note (Signed)
He reports he did not follow up as his numbness has resolved completely.  I suspect it was related to his previous HSV outbreak.

## 2015-10-23 NOTE — Patient Instructions (Signed)
Viral Conjunctivitis Viral conjunctivitis is an inflammation of the clear membrane that covers the white part of your eye and the inner surface of your eyelid (conjunctiva). The inflammation is caused by a viral infection. The blood vessels in the conjunctiva become inflamed, causing the eye to become red or pink, and often itchy. Viral conjunctivitis can easily be passed from one person to another (contagious). CAUSES  Viral conjunctivitis is caused by a virus. A virus is a type of contagious germ. It can be spread by touching objects that have been contaminated with the virus, such as doorknobs or towels.  SYMPTOMS  Symptoms of viral conjunctivitis may include:   Eye redness.  Tearing or watery eyes.  Itchy eyes.  Burning feeling in the eyes.  Clear drainage from the eye.  Swollen eyelids.  A gritty feeling in the eye.  Light sensitivity. DIAGNOSIS  Viral conjunctivitis may be diagnosed with a medical history and physical exam. If you have discharge from your eye, the discharge may be tested to rule out other causes of conjunctivitis.  TREATMENT  Viral conjunctivitis does not respond to medicines that kill bacteria (antibiotics). Treatment for viral conjunctivitis is directed at stopping a bacterial infection from developing in addition to the viral infection. Treatment also aims to relieve your symptoms, such as itching. This may be done with antihistamine drops or other eye medicines. HOME CARE INSTRUCTIONS  Take medicines only as directed by your health care provider.  Avoid touching or rubbing your eyes.  Apply a warm, clean washcloth to your eye for 10-20 minutes, 3-4 times per day.  If you wear contact lenses, do not wear them until the inflammation is gone and your health care provider says it is safe to wear them again. Ask your health care provider how to sterilize or replace your contact lenses before using them again. Wear glasses until you can resume wearing  contacts.  Avoid wearing eye makeup until the inflammation is gone. Throw away any old eye cosmetics that may be contaminated.  Change or wash your pillowcase every day.  Do not share towels or washcloths. This may spread the infection.  Wash your hands often with soap and water. Use paper towels to dry your hands.  Gently wipe away any drainage from your eye with a warm, wet washcloth or a cotton ball.  Be very careful to avoid touching the edge of the eyelid with the eye drop bottle or ointment tube when applying medicines to the affected eye. This will stop you from spreading the infection to the other eye or to other people. SEEK MEDICAL CARE IF:   Your symptoms do not improve with treatment.  You have increased pain.  Your vision becomes blurry.  You have a fever.  You have facial pain, redness, or swelling.  You have new symptoms.  Your symptoms get worse.   This information is not intended to replace advice given to you by your health care provider. Make sure you discuss any questions you have with your health care provider.   Document Released: 10/30/2002 Document Revised: 01/31/2006 Document Reviewed: 05/21/2014 Elsevier Interactive Patient Education 2016 [employer].  You can buy Naphcon-A or Ocuhist Over the counter to help with the symptoms.

## 2015-10-23 NOTE — Assessment & Plan Note (Signed)
A: Viral Conjunctivitis  P: Reassured patient that this will resolve with time and discussed OTC medications to assist with symptoms.

## 2015-10-27 NOTE — Progress Notes (Signed)
Internal Medicine Clinic Attending  Case discussed with Dr. Hoffman at the time of the visit.  We reviewed the resident's history and exam and pertinent patient test results.  I agree with the assessment, diagnosis, and plan of care documented in the resident's note.  

## 2015-11-10 ENCOUNTER — Ambulatory Visit (INDEPENDENT_AMBULATORY_CARE_PROVIDER_SITE_OTHER): Payer: BLUE CROSS/BLUE SHIELD | Admitting: Pharmacist

## 2015-11-10 DIAGNOSIS — Z7901 Long term (current) use of anticoagulants: Secondary | ICD-10-CM | POA: Diagnosis not present

## 2015-11-10 DIAGNOSIS — I2699 Other pulmonary embolism without acute cor pulmonale: Secondary | ICD-10-CM | POA: Diagnosis not present

## 2015-11-10 LAB — POCT INR: INR: 2.7

## 2015-11-10 MED ORDER — WARFARIN SODIUM 5 MG PO TABS: ORAL_TABLET | ORAL | Status: DC

## 2015-11-10 NOTE — Progress Notes (Signed)
Anti-Coagulation Progress Note  Richard [name] is a 40 y.o. male who is currently on an anti-coagulation regimen.    RECENT RESULTS: Recent results are below, the most recent result is correlated with a dose of 42.5 mg. per week: Lab Results  Component Value Date   INR 2.70 11/10/2015   INR 2.60 10/13/2015   INR 3.80 09/22/2015   PROTIME 20.4* 03/16/2011    ANTI-COAG DOSE: Anticoagulation Dose Instructions as of 11/10/2015      [name] Mon Tue Wed Thu Fri Sat   New Dose 5 mg 7.5 mg 5 mg 7.5 mg 5 mg 7.5 mg 5 mg       ANTICOAG SUMMARY: Anticoagulation Episode Summary    Current INR goal 2.0-3.0  Next INR check 12/08/2015  INR from last check 2.70 (11/10/2015)  Weekly max dose   Target end date Indefinite  [facility]  Preferred lab   Send INR reminders to    Indications  Pulmonary embolism and infarction (HCC) [I26.99] D V T [I80.299] Long term (current) use of anticoagulants [Z79.01]        Comments         ANTICOAG TODAY: Anticoagulation Summary as of 11/10/2015    INR goal 2.0-3.0  Selected INR 2.70 (11/10/2015)  Next INR check 12/08/2015  Target end date Indefinite   Indications  Pulmonary embolism and infarction (HCC) [I26.99] D V T [I80.299] Long term (current) use of anticoagulants [Z79.01]      Anticoagulation Episode Summary    [facility]   Preferred lab    Send INR reminders to    Comments       PATIENT INSTRUCTIONS: Patient Instructions  Patient instructed to take medications as defined in the Anti-coagulation Track section of this encounter.  Patient instructed to take today's dose.  Patient verbalized understanding of these instructions.       FOLLOW-UP Return in 4 weeks (on 12/08/2015) for Follow up INR at 3:30PM.  Hulen LusterJames Bonne Whack, III Pharm.D., CACP

## 2015-11-10 NOTE — Patient Instructions (Signed)
Patient instructed to take medications as defined in the Anti-coagulation Track section of this encounter.  Patient instructed to take today's dose.  Patient verbalized understanding of these instructions.    

## 2015-12-08 ENCOUNTER — Ambulatory Visit (INDEPENDENT_AMBULATORY_CARE_PROVIDER_SITE_OTHER): Payer: BLUE CROSS/BLUE SHIELD | Admitting: Pharmacist

## 2015-12-08 DIAGNOSIS — I2699 Other pulmonary embolism without acute cor pulmonale: Secondary | ICD-10-CM | POA: Diagnosis not present

## 2015-12-08 DIAGNOSIS — Z7901 Long term (current) use of anticoagulants: Secondary | ICD-10-CM

## 2015-12-08 LAB — POCT INR: INR: 1.6

## 2015-12-08 MED ORDER — WARFARIN SODIUM 5 MG PO TABS: ORAL_TABLET | ORAL | Status: DC

## 2015-12-08 NOTE — Addendum Note (Signed)
Addended by: Hulen LusterGROCE, Octavian Godek B on: 12/08/2015 03:52 PM   Modules accepted: Orders

## 2015-12-08 NOTE — Progress Notes (Signed)
Anti-Coagulation Progress Note  Berneice GandyMicha Peckenpaugh is a 40 y.o. male who is currently on an anti-coagulation regimen.    RECENT RESULTS: Recent results are below, the most recent result is correlated with a dose of 42.5 mg. per week: Lab Results  Component Value Date   INR 1.60 12/08/2015   INR 2.70 11/10/2015   INR 2.60 10/13/2015   PROTIME 20.4* 03/16/2011    ANTI-COAG DOSE: Anticoagulation Dose Instructions as of 12/08/2015      [name] Mon Tue Wed Thu Fri Sat   New Dose 5 mg 7.5 mg 7.5 mg 7.5 mg 5 mg 7.5 mg 5 mg       ANTICOAG SUMMARY: Anticoagulation Episode Summary    Current INR goal 2.0-3.0  Next INR check 01/05/2016  INR from last check 1.60! (12/08/2015)  Weekly max dose   Target end date Indefinite  [facility]  Preferred lab   Send INR reminders to    Indications  Pulmonary embolism and infarction (HCC) [I26.99] D V T [I80.299] Long term (current) use of anticoagulants [Z79.01]        Comments         ANTICOAG TODAY: Anticoagulation Summary as of 12/08/2015    INR goal 2.0-3.0  Selected INR 1.60! (12/08/2015)  Next INR check 01/05/2016  Target end date Indefinite   Indications  Pulmonary embolism and infarction (HCC) [I26.99] D V T [I80.299] Long term (current) use of anticoagulants [Z79.01]      Anticoagulation Episode Summary    [facility]   Preferred lab    Send INR reminders to    Comments       PATIENT INSTRUCTIONS: Patient Instructions  Patient instructed to take medications as defined in the Anti-coagulation Track section of this encounter.  Patient instructed to take today's dose.  Patient verbalized understanding of these instructions.       FOLLOW-UP Return in 4 weeks (on 01/05/2016) for Follow up INR at 3:30PM.  Hulen LusterJames Gareth Fitzner, III Pharm.D., CACP

## 2015-12-08 NOTE — Patient Instructions (Signed)
Patient instructed to take medications as defined in the Anti-coagulation Track section of this encounter.  Patient instructed to take today's dose.  Patient verbalized understanding of these instructions.    

## 2015-12-09 NOTE — Progress Notes (Signed)
Indication: Recurrent venous thromboembolism. Duration: Indefinite. INR: Below target. Agree with Dr. Saralyn PilarGroce's assessment and plan.

## 2015-12-15 DIAGNOSIS — R21 Rash and other nonspecific skin eruption: Secondary | ICD-10-CM | POA: Diagnosis not present

## 2016-01-05 ENCOUNTER — Ambulatory Visit (INDEPENDENT_AMBULATORY_CARE_PROVIDER_SITE_OTHER): Payer: BLUE CROSS/BLUE SHIELD | Admitting: Pharmacist

## 2016-01-05 DIAGNOSIS — I2699 Other pulmonary embolism without acute cor pulmonale: Secondary | ICD-10-CM

## 2016-01-05 DIAGNOSIS — Z7901 Long term (current) use of anticoagulants: Secondary | ICD-10-CM | POA: Diagnosis not present

## 2016-01-05 LAB — POCT INR: INR: 3.6

## 2016-01-05 MED ORDER — WARFARIN SODIUM 5 MG PO TABS: ORAL_TABLET | ORAL | Status: DC

## 2016-01-05 NOTE — Progress Notes (Signed)
Indication: Recurrent venous thromboembolism Duration: Indefinite INR: Above target.  Dr. Saralyn PilarGroce's assessment and plan were reviewed and I agree with his documentation.

## 2016-01-05 NOTE — Progress Notes (Signed)
Anti-Coagulation Progress Note  Richard GandyMicha [name] is a 40 y.o. male who is currently on an anti-coagulation regimen.    RECENT RESULTS: Recent results are below, the most recent result is correlated with a dose of 45 mg. per week: Lab Results  Component Value Date   INR 3.60 01/05/2016   INR 1.60 12/08/2015   INR 2.70 11/10/2015   PROTIME 20.4* 03/16/2011    ANTI-COAG DOSE: Anticoagulation Dose Instructions as of 01/05/2016      [name] Mon Tue Wed Thu Fri Sat   New Dose 5 mg 5 mg 7.5 mg 5 mg 7.5 mg 5 mg 7.5 mg       ANTICOAG SUMMARY: Anticoagulation Episode Summary    Current INR goal 2.0-3.0  Next INR check 02/02/2016  INR from last check 3.60! (01/05/2016)  Weekly max dose   Target end date Indefinite  [facility] location [facility]  Preferred lab   Send INR reminders to    Indications  Pulmonary embolism and infarction (HCC) [I26.99] D V T [I80.299] Long term (current) use of anticoagulants [Z79.01]        Comments         ANTICOAG TODAY: Anticoagulation Summary as of 01/05/2016    INR goal 2.0-3.0  Selected INR 3.60! (01/05/2016)  Next INR check 02/02/2016  Target end date Indefinite   Indications  Pulmonary embolism and infarction (HCC) [I26.99] D V T [I80.299] Long term (current) use of anticoagulants [Z79.01]      Anticoagulation Episode Summary    [facility] check location [facility]   Preferred lab    Send INR reminders to    Comments       PATIENT INSTRUCTIONS: Patient Instructions  Patient instructed to take medications as defined in the Anti-coagulation Track section of this encounter.  Patient instructed to take today's dose.  Patient verbalized understanding of these instructions.       FOLLOW-UP Return in 4 weeks (on 02/02/2016) for Follow up INR at 3:30PM.  Hulen LusterJames Groce, III Pharm.D., CACP

## 2016-01-05 NOTE — Patient Instructions (Signed)
Patient instructed to take medications as defined in the Anti-coagulation Track section of this encounter.  Patient instructed to take today's dose.  Patient verbalized understanding of these instructions.    

## 2016-01-22 DIAGNOSIS — S80869A Insect bite (nonvenomous), unspecified lower leg, initial encounter: Secondary | ICD-10-CM | POA: Diagnosis not present

## 2016-01-22 DIAGNOSIS — S40861A Insect bite (nonvenomous) of right upper arm, initial encounter: Secondary | ICD-10-CM | POA: Diagnosis not present

## 2016-01-22 DIAGNOSIS — S40869A Insect bite (nonvenomous) of unspecified upper arm, initial encounter: Secondary | ICD-10-CM | POA: Diagnosis not present

## 2016-01-22 DIAGNOSIS — S40862A Insect bite (nonvenomous) of left upper arm, initial encounter: Secondary | ICD-10-CM | POA: Diagnosis not present

## 2016-02-02 ENCOUNTER — Ambulatory Visit (INDEPENDENT_AMBULATORY_CARE_PROVIDER_SITE_OTHER): Payer: BLUE CROSS/BLUE SHIELD | Admitting: Pharmacist

## 2016-02-02 DIAGNOSIS — Z7901 Long term (current) use of anticoagulants: Secondary | ICD-10-CM

## 2016-02-02 DIAGNOSIS — I2699 Other pulmonary embolism without acute cor pulmonale: Secondary | ICD-10-CM | POA: Diagnosis not present

## 2016-02-02 LAB — POCT INR: INR: 2

## 2016-02-02 MED ORDER — WARFARIN SODIUM 5 MG PO TABS: ORAL_TABLET | ORAL | Status: DC

## 2016-02-02 NOTE — Patient Instructions (Signed)
Patient instructed to take medications as defined in the Anti-coagulation Track section of this encounter.  Patient instructed to take today's dose.  Patient verbalized understanding of these instructions.    

## 2016-02-02 NOTE — Progress Notes (Signed)
Anti-Coagulation Progress Note  Richard GandyMicha [name] is a 40 y.o. male who is currently on an anti-coagulation regimen.    RECENT RESULTS: Recent results are below, the most recent result is correlated with a dose of 42.5 mg. per week: Lab Results  Component Value Date   INR 2.00 02/02/2016   INR 3.60 01/05/2016   INR 1.60 12/08/2015   PROTIME 20.4* 03/16/2011    ANTI-COAG DOSE: Anticoagulation Dose Instructions as of 02/02/2016      [name] Mon Tue Wed Thu Fri Sat   New Dose 7.5 mg 5 mg 7.5 mg 5 mg 7.5 mg 5 mg 7.5 mg       ANTICOAG SUMMARY: Anticoagulation Episode Summary    Current INR goal 2.0-3.0  Next INR check 03/01/2016  INR from last check 2.00 (02/02/2016)  Weekly max dose   Target end date Indefinite  [facility]  Preferred lab   Send INR reminders to    Indications  Pulmonary embolism and infarction (HCC) [I26.99] D V T [I80.299] Long term (current) use of anticoagulants [Z79.01]        Comments         ANTICOAG TODAY: Anticoagulation Summary as of 02/02/2016    INR goal 2.0-3.0  Selected INR 2.00 (02/02/2016)  Next INR check 03/01/2016  Target end date Indefinite   Indications  Pulmonary embolism and infarction (HCC) [I26.99] D V T [I80.299] Long term (current) use of anticoagulants [Z79.01]      Anticoagulation Episode Summary    [facility]   Preferred lab    Send INR reminders to    Comments       PATIENT INSTRUCTIONS: Patient Instructions  Patient instructed to take medications as defined in the Anti-coagulation Track section of this encounter.  Patient instructed to take today's dose.  Patient verbalized understanding of these instructions.       FOLLOW-UP Return in 4 weeks (on 03/01/2016) for Follow up INR at 3:15PM.  Hulen LusterJames Devine Dant, III Pharm.D., CACP

## 2016-03-01 ENCOUNTER — Ambulatory Visit (INDEPENDENT_AMBULATORY_CARE_PROVIDER_SITE_OTHER): Payer: BLUE CROSS/BLUE SHIELD | Admitting: Pharmacist

## 2016-03-01 DIAGNOSIS — I2699 Other pulmonary embolism without acute cor pulmonale: Secondary | ICD-10-CM

## 2016-03-01 DIAGNOSIS — Z7901 Long term (current) use of anticoagulants: Secondary | ICD-10-CM | POA: Diagnosis not present

## 2016-03-01 LAB — POCT INR: INR: 3.5

## 2016-03-01 MED ORDER — WARFARIN SODIUM 5 MG PO TABS: ORAL_TABLET | ORAL | Status: DC

## 2016-03-01 NOTE — Progress Notes (Signed)
Anti-Coagulation Progress Note  Berneice GandyMicha Polhemus is a 40 y.o. male who is currently on an anti-coagulation regimen.    RECENT RESULTS: Recent results are below, the most recent result is correlated with a dose of 45 mg. per week: Lab Results  Component Value Date   INR 3.50 03/01/2016   INR 2.00 02/02/2016   INR 3.60 01/05/2016   PROTIME 20.4* 03/16/2011    ANTI-COAG DOSE: Anticoagulation Dose Instructions as of 03/01/2016      [name] Mon Tue Wed Thu Fri Sat   New Dose 7.5 mg 5 mg 5 mg 5 mg 7.5 mg 5 mg 7.5 mg       ANTICOAG SUMMARY: Anticoagulation Episode Summary    Current INR goal 2.0-3.0  Next INR check 03/29/2016  INR from last check 3.50! (03/01/2016)  Weekly max dose   Target end date Indefinite  INR check location [facility]  Preferred lab   Send INR reminders to    Indications  Pulmonary embolism and infarction (HCC) [I26.99] D V T [I80.299] Long term (current) use of anticoagulants [Z79.01]        Comments         ANTICOAG TODAY: Anticoagulation Summary as of 03/01/2016    INR goal 2.0-3.0  Selected INR 3.50! (03/01/2016)  Next INR check 03/29/2016  Target end date Indefinite   Indications  Pulmonary embolism and infarction (HCC) [I26.99] D V T [I80.299] Long term (current) use of anticoagulants [Z79.01]      Anticoagulation Episode Summary    INR check location [facility]   Preferred lab    Send INR reminders to    Comments       PATIENT INSTRUCTIONS: Patient Instructions  Patient instructed to take medications as defined in the Anti-coagulation Track section of this encounter.  Patient instructed to take today's dose.  Patient verbalized understanding of these instructions.       FOLLOW-UP Return in 4 weeks (on 03/29/2016) for Follow up INR at 3:30PM.  Hulen LusterJames Groce, III Pharm.D., CACP

## 2016-03-01 NOTE — Patient Instructions (Signed)
Patient instructed to take medications as defined in the Anti-coagulation Track section of this encounter.  Patient instructed to take today's dose.  Patient verbalized understanding of these instructions.    

## 2016-03-01 NOTE — Progress Notes (Signed)
INTERNAL MEDICINE TEACHING ATTENDING ADDENDUM - Gust RungErik C [place] Nabor, DO  Duration- indefinate, Indication- recurrent VTE, INR- 3.5. Supratheraputic, slightly dose decrease and follow up in 4 weeks Agree with pharmacy recommendations as outlined in their note.

## 2016-03-01 NOTE — Addendum Note (Signed)
Addended by: Hulen LusterGROCE, Trek Kimball B on: 03/01/2016 04:11 PM   Modules accepted: Orders

## 2016-03-29 ENCOUNTER — Ambulatory Visit (INDEPENDENT_AMBULATORY_CARE_PROVIDER_SITE_OTHER): Payer: BLUE CROSS/BLUE SHIELD | Admitting: Pharmacist

## 2016-03-29 DIAGNOSIS — Z7901 Long term (current) use of anticoagulants: Secondary | ICD-10-CM | POA: Diagnosis not present

## 2016-03-29 DIAGNOSIS — I2699 Other pulmonary embolism without acute cor pulmonale: Secondary | ICD-10-CM

## 2016-03-29 LAB — POCT INR: INR: 3.1

## 2016-03-29 MED ORDER — WARFARIN SODIUM 5 MG PO TABS: ORAL_TABLET | ORAL | 2 refills | Status: DC

## 2016-03-29 NOTE — Progress Notes (Signed)
Anti-Coagulation Progress Note  Richard GandyMicha [name] is a 40 y.o. male who is currently on an anti-coagulation regimen.    RECENT RESULTS: Recent results are below, the most recent result is correlated with a dose of 42.5 mg. per week: Lab Results  Component Value Date   INR 3.10 03/29/2016   INR 3.50 03/01/2016   INR 2.00 02/02/2016   PROTIME 20.4 (H) 03/16/2011    ANTI-COAG DOSE: Anticoagulation Dose Instructions as of 03/29/2016      [name] Mon Tue Wed Thu Fri Sat   New Dose 5 mg 5 mg 5 mg 5 mg 7.5 mg 5 mg 7.5 mg       ANTICOAG SUMMARY: Anticoagulation Episode Summary    Current INR goal:   2.0-3.0  TTR:   58.4 % (3.9 y)  Next INR check:   05/03/2016  INR from last check:   3.10! (03/29/2016)  Weekly max dose:     Target end date:   Indefinite  INR check location:   [facility]  Preferred lab:     Send INR reminders to:      Indications   Pulmonary embolism and infarction (HCC) [I26.99] D V T [I80.299] Long term (current) use of anticoagulants [Z79.01]       Comments:           ANTICOAG TODAY: Anticoagulation Summary  As of 03/29/2016   INR goal:   2.0-3.0  TTR:     Today's INR:   3.10!  Next INR check:   05/03/2016  Target end date:   Indefinite   Indications   Pulmonary embolism and infarction (HCC) [I26.99] D V T [I80.299] Long term (current) use of anticoagulants [Z79.01]        Anticoagulation Episode Summary    INR check location:   [facility]   Preferred lab:      Send INR reminders to:      Comments:         PATIENT INSTRUCTIONS: There are no Patient Instructions on file for this visit.   FOLLOW-UP Return in 5 weeks (on 05/03/2016) for Follow up INR at 3:30PM.  Hulen LusterJames Mancel Lardizabal, III Pharm.D., CACP

## 2016-03-29 NOTE — Patient Instructions (Signed)
Patient instructed to take medications as defined in the Anti-coagulation Track section of this encounter.  Patient instructed to take today's dose.  Patient verbalized understanding of these instructions.    

## 2016-03-31 NOTE — Progress Notes (Signed)
INTERNAL MEDICINE TEACHING ATTENDING ADDENDUM - Annelisa Ryback M.D  Duration- indefinite, Indication- recurrent VTE, INR- supratherapeutic. Agree with pharmacy recommendations as outlined in their note.   

## 2016-05-03 ENCOUNTER — Ambulatory Visit (INDEPENDENT_AMBULATORY_CARE_PROVIDER_SITE_OTHER): Payer: BLUE CROSS/BLUE SHIELD

## 2016-05-03 DIAGNOSIS — Z7901 Long term (current) use of anticoagulants: Secondary | ICD-10-CM

## 2016-05-03 DIAGNOSIS — I2699 Other pulmonary embolism without acute cor pulmonale: Secondary | ICD-10-CM

## 2016-05-03 LAB — POCT INR: INR: 3.1

## 2016-05-03 MED ORDER — WARFARIN SODIUM 5 MG PO TABS: ORAL_TABLET | ORAL | 1 refills | Status: DC

## 2016-05-03 NOTE — Progress Notes (Signed)
Anti-Coagulation Progress Note  Richard [name] is a 40 y.o. male who is currently on an anti-coagulation regimen.    RECENT RESULTS: Recent results are below, the most recent result is correlated with a dose of 40 mg. per week: Lab Results  Component Value Date   INR 3.1 05/03/2016   INR 3.10 03/29/2016   INR 3.50 03/01/2016   PROTIME 20.4 (H) 03/16/2011    ANTI-COAG DOSE: Anticoagulation Dose Instructions as of 05/03/2016      [name] Mon Tue Wed Thu Fri Sat   New Dose 5 mg 5 mg 5 mg 7.5 mg 5 mg 5 mg 5 mg       ANTICOAG SUMMARY: Anticoagulation Episode Summary    Current INR goal:   2.0-3.0  TTR:   57.0 % (4 y)  Next INR check:   05/31/2016  INR from last check:   3.1! (05/03/2016)  Weekly max dose:     Target end date:   Indefinite  INR check location:   [facility]  Preferred lab:     Send INR reminders to:      Indications   Pulmonary embolism and infarction (HCC) [I26.99] D V T [I80.299] Long term (current) use of anticoagulants [Z79.01]       Comments:           ANTICOAG TODAY: Anticoagulation Summary  As of 05/03/2016   INR goal:   2.0-3.0  TTR:     Today's INR:   3.1!  Next INR check:   05/31/2016  Target end date:   Indefinite   Indications   Pulmonary embolism and infarction (HCC) [I26.99] D V T [I80.299] Long term (current) use of anticoagulants [Z79.01]        Anticoagulation Episode Summary    INR check location:   [facility]   Preferred lab:      Send INR reminders to:      Comments:         PATIENT INSTRUCTIONS: There are no Patient Instructions on file for this visit.   FOLLOW-UP Return in about 4 weeks (around 05/31/2016) for Follow up INR at 3:30 PM.  York CeriseKatherine Cook, PharmD Pharmacy Resident  Pager [phone] 05/03/16 4:07 PM

## 2016-05-03 NOTE — Patient Instructions (Signed)
Patient instructed to take medications as defined in the Anti-coagulation Track section of this encounter.  Patient instructed to take today's dose.  Patient verbalized understanding of these instructions.    

## 2016-05-04 NOTE — Progress Notes (Signed)
Indication: Recurrent venous thromboembolism. Duration: Indefinite. INR: Above target. Dr. Patsey Bertholdook's documentation reviewed.

## 2016-05-31 ENCOUNTER — Ambulatory Visit (INDEPENDENT_AMBULATORY_CARE_PROVIDER_SITE_OTHER): Payer: BLUE CROSS/BLUE SHIELD | Admitting: Pharmacist

## 2016-05-31 DIAGNOSIS — I2699 Other pulmonary embolism without acute cor pulmonale: Secondary | ICD-10-CM

## 2016-05-31 DIAGNOSIS — Z7901 Long term (current) use of anticoagulants: Secondary | ICD-10-CM | POA: Diagnosis not present

## 2016-05-31 LAB — POCT INR: INR: 2.1

## 2016-05-31 MED ORDER — WARFARIN SODIUM 5 MG PO TABS: ORAL_TABLET | ORAL | 1 refills | Status: DC

## 2016-05-31 NOTE — Progress Notes (Signed)
Anti-Coagulation Progress Note  Richard [name] is a 40 y.o. male who is currently on an anti-coagulation regimen.    RECENT RESULTS: Recent results are below, the most recent result is correlated with a dose of 37.5 mg. per week: Lab Results  Component Value Date   INR 2.10 05/31/2016   INR 3.1 05/03/2016   INR 3.10 03/29/2016   PROTIME 20.4 (H) 03/16/2011    ANTI-COAG DOSE: Anticoagulation Dose Instructions as of 05/31/2016      [name] Mon Tue Wed Thu Fri Sat   New Dose 5 mg 5 mg 5 mg 7.5 mg 5 mg 5 mg 5 mg       ANTICOAG SUMMARY: Anticoagulation Episode Summary    Current INR goal:   2.0-3.0  TTR:   57.6 % (4.1 y)  Next INR check:   07/05/2016  INR from last check:   2.10 (05/31/2016)  Weekly max dose:     Target end date:   Indefinite  INR check location:   [facility]  Preferred lab:     Send INR reminders to:      Indications   Pulmonary embolism and infarction (HCC) [I26.99] D V T [I80.299] Long term (current) use of anticoagulants [Z79.01]       Comments:           ANTICOAG TODAY: Anticoagulation Summary  As of 05/31/2016   INR goal:   2.0-3.0  TTR:     Today's INR:   2.10  Next INR check:   07/05/2016  Target end date:   Indefinite   Indications   Pulmonary embolism and infarction (HCC) [I26.99] D V T [I80.299] Long term (current) use of anticoagulants [Z79.01]        Anticoagulation Episode Summary    INR check location:   [facility]   Preferred lab:      Send INR reminders to:      Comments:         PATIENT INSTRUCTIONS: There are no Patient Instructions on file for this visit.   FOLLOW-UP Return in 5 weeks (on 07/05/2016) for Follow up INR at 3:45PM.  Hulen LusterJames Leannah Guse, III Pharm.D., CACP

## 2016-05-31 NOTE — Patient Instructions (Signed)
Patient instructed to take medications as defined in the Anti-coagulation Track section of this encounter.  Patient instructed to take today's dose.  Patient verbalized understanding of these instructions.    

## 2016-07-05 ENCOUNTER — Ambulatory Visit (INDEPENDENT_AMBULATORY_CARE_PROVIDER_SITE_OTHER): Payer: BLUE CROSS/BLUE SHIELD | Admitting: Pharmacist

## 2016-07-05 DIAGNOSIS — I2699 Other pulmonary embolism without acute cor pulmonale: Secondary | ICD-10-CM

## 2016-07-05 DIAGNOSIS — Z7901 Long term (current) use of anticoagulants: Secondary | ICD-10-CM

## 2016-07-05 LAB — POCT INR: INR: 1.9

## 2016-07-05 MED ORDER — WARFARIN SODIUM 5 MG PO TABS: ORAL_TABLET | ORAL | 1 refills | Status: DC

## 2016-07-05 NOTE — Patient Instructions (Signed)
Patient instructed to take medications as defined in the Anti-coagulation Track section of this encounter.  Patient instructed to take today's dose.  Patient verbalized understanding of these instructions.    

## 2016-07-05 NOTE — Progress Notes (Signed)
Anti-Coagulation Progress Note  Berneice GandyMicha Bussiere is a 40 y.o. male who is currently on an anti-coagulation regimen.    RECENT RESULTS: Recent results are below, the most recent result is correlated with a dose of 37.5 mg. per week: Lab Results  Component Value Date   INR 1.9 07/05/2016   INR 2.10 05/31/2016   INR 3.1 05/03/2016   PROTIME 20.4 (H) 03/16/2011    ANTI-COAG DOSE: Anticoagulation Dose Instructions as of 07/05/2016      [name] Mon Tue Wed Thu Fri Sat   New Dose 5 mg 7.5 mg 5 mg 7.5 mg 5 mg 5 mg 5 mg       ANTICOAG SUMMARY: Anticoagulation Episode Summary    Current INR goal:   2.0-3.0  TTR:   57.4 % (4.2 y)  Next INR check:   07/26/2016  INR from last check:     Weekly max dose:     Target end date:   Indefinite  INR check location:   [facility]  Preferred lab:     Send INR reminders to:      Indications   Pulmonary embolism and infarction (HCC) [I26.99] D V T [I80.299] Long term (current) use of anticoagulants [Z79.01]       Comments:           ANTICOAG TODAY: Anticoagulation Summary  As of 07/05/2016   INR goal:   2.0-3.0  TTR:     Today's INR:     Next INR check:   07/26/2016  Target end date:   Indefinite   Indications   Pulmonary embolism and infarction (HCC) [I26.99] D V T [I80.299] Long term (current) use of anticoagulants [Z79.01]        Anticoagulation Episode Summary    INR check location:   [facility]   Preferred lab:      Send INR reminders to:      Comments:         PATIENT INSTRUCTIONS:  Patient instructed to take medications as defined in the Anti-coagulation Track section of this encounter.  Patient instructed to take today's dose.  Patient verbalized understanding of these instructions.   FOLLOW-UP Return in about 3 weeks (around 07/26/2016) for INR Follow Up Appointment at 3:30PM.  Carylon PerchesMaggie Traeson Dusza, PharmD Acute Care Pharmacy Resident  Pager: [phone] 07/05/2016

## 2016-07-09 NOTE — Progress Notes (Signed)
I have reviewed the resident note.  INR 1.9.  Dose of coumadin unchanged.

## 2016-07-12 ENCOUNTER — Telehealth: Payer: Self-pay | Admitting: Pharmacist

## 2016-07-12 NOTE — Telephone Encounter (Signed)
Left message:  We need to re-schedule INR appointment that was made for 4-DEC-17 TO Monday 11-DEC-17 at 3:30PM.

## 2016-07-26 ENCOUNTER — Encounter: Payer: Self-pay | Admitting: *Deleted

## 2016-08-02 ENCOUNTER — Ambulatory Visit (INDEPENDENT_AMBULATORY_CARE_PROVIDER_SITE_OTHER): Payer: BLUE CROSS/BLUE SHIELD | Admitting: Pharmacist

## 2016-08-02 DIAGNOSIS — Z7901 Long term (current) use of anticoagulants: Secondary | ICD-10-CM

## 2016-08-02 DIAGNOSIS — I2699 Other pulmonary embolism without acute cor pulmonale: Secondary | ICD-10-CM

## 2016-08-02 LAB — POCT INR: INR: 1.1

## 2016-08-02 MED ORDER — WARFARIN SODIUM 5 MG PO TABS: ORAL_TABLET | ORAL | 2 refills | Status: DC

## 2016-08-02 NOTE — Progress Notes (Signed)
Anti-Coagulation Progress Note  Richard [name] is a 40 y.o. male who is currently on an anti-coagulation regimen.    RECENT RESULTS: Recent results are below, the most recent result is correlated with a dose of 40 mg. per week: Lab Results  Component Value Date   INR 1.10 08/02/2016   INR 1.9 07/05/2016   INR 2.10 05/31/2016   PROTIME 20.4 (H) 03/16/2011    ANTI-COAG DOSE: Anticoagulation Dose Instructions as of 08/02/2016      [name] Mon Tue Wed Thu Fri Sat   New Dose 5 mg 7.5 mg 7.5 mg 7.5 mg 5 mg 7.5 mg 5 mg       ANTICOAG SUMMARY: Anticoagulation Episode Summary    Current INR goal:   2.0-3.0  TTR:   56.4 % (4.3 y)  Next INR check:   08/30/2016  INR from last check:   1.10! (08/02/2016)  Weekly max dose:     Target end date:   Indefinite  INR check location:   [facility]  Preferred lab:     Send INR reminders to:      Indications   Pulmonary embolism and infarction (HCC) [I26.99] D V T [I80.299] Long term (current) use of anticoagulants [Z79.01]       Comments:           ANTICOAG TODAY: Anticoagulation Summary  As of 08/02/2016   INR goal:   2.0-3.0  TTR:     Today's INR:   1.10!  Next INR check:   08/30/2016  Target end date:   Indefinite   Indications   Pulmonary embolism and infarction (HCC) [I26.99] D V T [I80.299] Long term (current) use of anticoagulants [Z79.01]        Anticoagulation Episode Summary    INR check location:   [facility]   Preferred lab:      Send INR reminders to:      Comments:         PATIENT INSTRUCTIONS: Patient instructed to take medications as defined in the Anti-coagulation Track section of this encounter.  Patient instructed to take today's dose.  Patient instructed to take 1&1/2 tablets on Mondays, Tuesdays, Wednesdays and Fridays; all other days--take 1 tablet only.  Patient verbalized understanding of these instructions.     FOLLOW-UP Return in 4 weeks (on 08/30/2016) for Follow up INR at 4PM.  Hulen LusterJames  Groce, III Pharm.D., CACP

## 2016-08-02 NOTE — Patient Instructions (Signed)
Patient instructed to take medications as defined in the Anti-coagulation Track section of this encounter.  Patient instructed to take today's dose.  Patient instructed to take 1&1/2 tablets on Mondays, Tuesdays, Wednesdays and Fridays; all other days--take 1 tablet only.  Patient verbalized understanding of these instructions.

## 2016-08-30 ENCOUNTER — Ambulatory Visit (INDEPENDENT_AMBULATORY_CARE_PROVIDER_SITE_OTHER): Payer: BLUE CROSS/BLUE SHIELD | Admitting: Pharmacist

## 2016-08-30 DIAGNOSIS — I2699 Other pulmonary embolism without acute cor pulmonale: Secondary | ICD-10-CM

## 2016-08-30 DIAGNOSIS — Z7901 Long term (current) use of anticoagulants: Secondary | ICD-10-CM | POA: Diagnosis not present

## 2016-08-30 LAB — POCT INR: INR: 3.5

## 2016-08-30 MED ORDER — WARFARIN SODIUM 5 MG PO TABS: ORAL_TABLET | ORAL | 2 refills | Status: DC

## 2016-08-30 NOTE — Progress Notes (Signed)
Anti-Coagulation Progress Note  Richard [name] is a 41 y.o. male who is currently on an anti-coagulation regimen.    RECENT RESULTS: Recent results are below, the most recent result is correlated with a dose of 45 mg. per week: Lab Results  Component Value Date   INR 3.50 08/30/2016   INR 1.10 08/02/2016   INR 1.9 07/05/2016   PROTIME 20.4 (H) 03/16/2011    ANTI-COAG DOSE: Anticoagulation Dose Instructions as of 08/30/2016      [name] Mon Tue Wed Thu Fri Sat   New Dose 5 mg 7.5 mg 5 mg 7.5 mg 5 mg 7.5 mg 5 mg       ANTICOAG SUMMARY: Anticoagulation Episode Summary    Current INR goal:   2.0-3.0  TTR:   56.1 % (4.3 y)  Next INR check:   09/27/2016  INR from last check:   3.50! (08/30/2016)  Weekly max dose:     Target end date:   Indefinite  INR check location:   [facility]  Preferred lab:     Send INR reminders to:      Indications   Pulmonary embolism and infarction (HCC) [I26.99] D V T [I80.299] Long term (current) use of anticoagulants [Z79.01]       Comments:           ANTICOAG TODAY: Anticoagulation Summary  As of 08/30/2016   INR goal:   2.0-3.0  TTR:     Today's INR:   3.50!  Next INR check:   09/27/2016  Target end date:   Indefinite   Indications   Pulmonary embolism and infarction (HCC) [I26.99] D V T [I80.299] Long term (current) use of anticoagulants [Z79.01]        Anticoagulation Episode Summary    INR check location:   [facility]   Preferred lab:      Send INR reminders to:      Comments:         PATIENT INSTRUCTIONS: Patient instructed to take medications as defined in the Anti-coagulation Track section of this encounter.  Patient instructed to take today's dose.  Patient instructed to take 1&1/2 tablets on Mondays/Wednesdays/Fridays--all other days, take only 1 tablet. Patient verbalized understanding of these instructions.       FOLLOW-UP Return in 4 weeks (on 09/27/2016) for Follow up INR at 4PM.  Hulen LusterJames Aastha Dayley, III Pharm.D.,  CACP

## 2016-08-30 NOTE — Patient Instructions (Signed)
Patient instructed to take medications as defined in the Anti-coagulation Track section of this encounter.  Patient instructed to take today's dose.  Patient instructed to take 1&1/2 tablets on Mondays/Wednesdays/Fridays--all other days, take only 1 tablet. Patient verbalized understanding of these instructions.

## 2016-08-31 NOTE — Progress Notes (Signed)
INTERNAL MEDICINE TEACHING ATTENDING ADDENDUM - Gust RungErik C Cheryl Chay, DO Duration- indefinate, Indication- recurrent VTE, INR-  Lab Results  Component Value Date   INR 3.50 08/30/2016   PROTIME 20.4 (H) 03/16/2011  . Agree with pharmacy recommendations as outlined in their note.

## 2016-09-08 ENCOUNTER — Encounter: Payer: BLUE CROSS/BLUE SHIELD | Admitting: Internal Medicine

## 2016-09-27 ENCOUNTER — Ambulatory Visit (INDEPENDENT_AMBULATORY_CARE_PROVIDER_SITE_OTHER): Payer: BLUE CROSS/BLUE SHIELD | Admitting: Pharmacist

## 2016-09-27 ENCOUNTER — Encounter (INDEPENDENT_AMBULATORY_CARE_PROVIDER_SITE_OTHER): Payer: Self-pay

## 2016-09-27 DIAGNOSIS — Z7901 Long term (current) use of anticoagulants: Secondary | ICD-10-CM

## 2016-09-27 DIAGNOSIS — I2699 Other pulmonary embolism without acute cor pulmonale: Secondary | ICD-10-CM | POA: Diagnosis not present

## 2016-09-27 LAB — POCT INR: INR: 1.5

## 2016-09-27 MED ORDER — WARFARIN SODIUM 5 MG PO TABS: ORAL_TABLET | ORAL | 2 refills | Status: DC

## 2016-09-27 NOTE — Progress Notes (Signed)
Anti-Coagulation Progress Note  Berneice GandyMicha Hollabaugh is a 41 y.o. male who is currently on an anti-coagulation regimen.    RECENT RESULTS: Recent results are below, the most recent result is correlated with a dose of 42.5 mg. per week: Lab Results  Component Value Date   INR 1.50 09/27/2016   INR 3.50 08/30/2016   INR 1.10 08/02/2016   PROTIME 20.4 (H) 03/16/2011    ANTI-COAG DOSE: Anticoagulation Dose Instructions as of 09/27/2016      [name] Mon Tue Wed Thu Fri Sat   New Dose 5 mg 7.5 mg 7.5 mg 7.5 mg 5 mg 7.5 mg 5 mg       ANTICOAG SUMMARY: Anticoagulation Episode Summary    Current INR goal:   2.0-3.0  TTR:   56.0 % (4.4 y)  Next INR check:   10/25/2016  INR from last check:   1.50! (09/27/2016)  Weekly max dose:     Target end date:   Indefinite  INR check location:   [facility]  Preferred lab:     Send INR reminders to:      Indications   Pulmonary embolism and infarction (HCC) [I26.99] D V T [I80.299] Long term (current) use of anticoagulants [Z79.01]       Comments:           ANTICOAG TODAY: Anticoagulation Summary  As of 09/27/2016   INR goal:   2.0-3.0  TTR:     Today's INR:   1.50!  Next INR check:   10/25/2016  Target end date:   Indefinite   Indications   Pulmonary embolism and infarction (HCC) [I26.99] D V T [I80.299] Long term (current) use of anticoagulants [Z79.01]        Anticoagulation Episode Summary    INR check location:   [facility]   Preferred lab:      Send INR reminders to:      Comments:         PATIENT INSTRUCTIONS: Patient instructed to take medications as defined in the Anti-coagulation Track section of this encounter.  Patient instructed to take today's dose.  Take 1&1/2 tablets of your warfarin 5mg  peach colored tablets on Mondays, Tuesdays, Wednesdays and Fridays--all other days, take only 1 tablet.  Patient verbalized understanding of these instructions.      FOLLOW-UP Return in 4 weeks (on 10/25/2016) for Follow up INR  at 4PM.  Hulen LusterJames Keshana Klemz, III Pharm.D., CACP

## 2016-09-27 NOTE — Patient Instructions (Signed)
Patient instructed to take medications as defined in the Anti-coagulation Track section of this encounter.  Patient instructed to take today's dose.  Take 1&1/2 tablets of your warfarin 5mg  peach colored tablets on Mondays, Tuesdays, Wednesdays and Fridays--all other days, take only 1 tablet.  Patient verbalized understanding of these instructions.

## 2016-09-28 NOTE — Progress Notes (Signed)
INTERNAL MEDICINE TEACHING ATTENDING ADDENDUM - Amori Cooperman M.D  Duration- indefinite, Indication- recurrent VTE, INR- sub therapeutic. Agree with pharmacy recommendations as outlined in their note.     

## 2016-10-25 ENCOUNTER — Ambulatory Visit (INDEPENDENT_AMBULATORY_CARE_PROVIDER_SITE_OTHER): Payer: BLUE CROSS/BLUE SHIELD | Admitting: Pharmacist

## 2016-10-25 DIAGNOSIS — Z7901 Long term (current) use of anticoagulants: Secondary | ICD-10-CM | POA: Diagnosis not present

## 2016-10-25 DIAGNOSIS — I2699 Other pulmonary embolism without acute cor pulmonale: Secondary | ICD-10-CM

## 2016-10-25 LAB — POCT INR: INR: 2.1

## 2016-10-25 MED ORDER — WARFARIN SODIUM 5 MG PO TABS: ORAL_TABLET | ORAL | 2 refills | Status: DC

## 2016-10-25 NOTE — Progress Notes (Signed)
Anti-Coagulation Progress Note  Richard [name] is a 41 y.o. male who is currently on an anti-coagulation regimen.    RECENT RESULTS: Recent results are below, the most recent result is correlated with a dose of 45 mg. per week: Lab Results  Component Value Date   INR 2.10 10/25/2016   INR 1.50 09/27/2016   INR 3.50 08/30/2016   PROTIME 20.4 (H) 03/16/2011    ANTI-COAG DOSE: Anticoagulation Dose Instructions as of 10/25/2016      [name] Mon Tue Wed Thu Fri Sat   New Dose 5 mg 7.5 mg 7.5 mg 7.5 mg 7.5 mg 7.5 mg 7.5 mg    Description   Take 1 & 1/2 tablets of your 5mg  peach colored warfarin tablets by mouth once-daily at 6PM--EXCEPT on Sundays--take ONLY 1 tablet on Sundays.       ANTICOAG SUMMARY: Anticoagulation Episode Summary    Current INR goal:   2.0-3.0  TTR:   55.4 % (4.5 y)  Next INR check:   11/22/2016  INR from last check:   2.10 (10/25/2016)  Weekly max dose:     Target end date:   Indefinite  INR check location:   [facility]  Preferred lab:     Send INR reminders to:      Indications   Pulmonary embolism and infarction (HCC) [I26.99] D V T [I80.299] Long term (current) use of anticoagulants [Z79.01]       Comments:           ANTICOAG TODAY: Anticoagulation Summary  As of 10/25/2016   INR goal:   2.0-3.0  TTR:     Today's INR:   2.10  Next INR check:   11/22/2016  Target end date:   Indefinite   Indications   Pulmonary embolism and infarction (HCC) [I26.99] D V T [I80.299] Long term (current) use of anticoagulants [Z79.01]        Anticoagulation Episode Summary    INR check location:   [facility]   Preferred lab:      Send INR reminders to:      Comments:         PATIENT INSTRUCTIONS: Patient Instructions  Patient instructed to take medications as defined in the Anti-coagulation Track section of this encounter.  Patient instructed to take today's dose.  Patient instructed to take 1 & 1/2 tablets of your 5mg  peach colored warfarin tablets  by mouth once-daily at 6PM on ALL DAYS--except on Sundays--take ONLY 1 tablet on Sundays. Patient verbalized understanding of these instructions.       FOLLOW-UP Return in 4 weeks (on 11/22/2016) for Follow up INR at 3:45PM.  Hulen LusterJames Yamilett Anastos, III Pharm.D., CACP

## 2016-10-25 NOTE — Patient Instructions (Signed)
Patient instructed to take medications as defined in the Anti-coagulation Track section of this encounter.  Patient instructed to take today's dose.  Patient instructed to take 1 & 1/2 tablets of your 5mg  peach colored warfarin tablets by mouth once-daily at 6PM on ALL DAYS--except on Sundays--take ONLY 1 tablet on Sundays. Patient verbalized understanding of these instructions.

## 2016-10-26 ENCOUNTER — Telehealth: Payer: Self-pay | Admitting: Internal Medicine

## 2016-10-26 NOTE — Telephone Encounter (Signed)
Dental office needs to talk to nurse please call

## 2016-10-26 NOTE — Telephone Encounter (Signed)
Dental office wants records, ask for release of information they also want medical clearance, have tried to call pt to set up appt, lm for rtc

## 2016-10-27 NOTE — Progress Notes (Signed)
INTERNAL MEDICINE TEACHING ATTENDING ADDENDUM - Daymien Goth M.D  Duration- indefinite, Indication- PE, DVT, INR- therapeutic. Agree with pharmacy recommendations as outlined in their note.      

## 2016-11-22 ENCOUNTER — Ambulatory Visit (INDEPENDENT_AMBULATORY_CARE_PROVIDER_SITE_OTHER): Payer: BLUE CROSS/BLUE SHIELD | Admitting: Pharmacist

## 2016-11-22 DIAGNOSIS — I2699 Other pulmonary embolism without acute cor pulmonale: Secondary | ICD-10-CM

## 2016-11-22 DIAGNOSIS — Z7901 Long term (current) use of anticoagulants: Secondary | ICD-10-CM

## 2016-11-22 LAB — POCT INR: INR: 4.8

## 2016-11-22 NOTE — Progress Notes (Signed)
Anti-Coagulation Progress Note  Richard [name] is a 41 y.o. male who is currently on an anti-coagulation regimen.    RECENT RESULTS: Recent results are below, the most recent result is correlated with a dose of 50 mg. per week: Lab Results  Component Value Date   INR 4.80 11/22/2016   INR 2.10 10/25/2016   INR 1.50 09/27/2016   PROTIME 20.4 (H) 03/16/2011    ANTI-COAG DOSE: Anticoagulation Dose Instructions as of 11/22/2016      [name] Tue Wed Thu Fri Sat   New Dose 5 mg 7.5 mg 5 mg 7.5 mg 7.5 mg 7.5 mg 7.5 mg    Description   Take 1 & 1/2 tablets of your  peach colored warfarin tablets by mouth once-daily at [facility] on Mondays, Wednesdays, Fridays and Saturdays; all other days--take only ONE (1) tablet.       ANTICOAG SUMMARY: Anticoagulation Episode Summary    Current INR goal:   2.0-3.0  TTR:   55.0 % (4.6 y)  Next INR check:   12/06/2016  INR from last check:   4.80! (11/22/2016)  Weekly max dose:     Target end date:   Indefinite  INR check location:   [facility]  Preferred lab:     Send INR reminders to:      Indications   Pulmonary embolism and infarction (HCC) [I26.99] D V T [I80.299] Long term (current) use of anticoagulants [Z79.01]       Comments:           ANTICOAG TODAY: Anticoagulation Summary  As of 11/22/2016   INR goal:   2.0-3.0  TTR:     Today's INR:   4.80!  Next INR check:   12/06/2016  Target end date:   Indefinite   Indications   Pulmonary embolism and infarction (HCC) [I26.99] D V T [I80.299] Long term (current) use of anticoagulants [Z79.01]        Anticoagulation Episode Summary    INR check location:   [facility]   Preferred lab:      Send INR reminders to:      Comments:         PATIENT INSTRUCTIONS: Patient Instructions  Patient instructed to take medications as defined in the Anti-coagulation Track section of this encounter.  Patient instructed to take today's dose.  Patient instructed to take  Su-5mg M-7.5mg T-5mg W-7.5mg Th-5mg F-7.5mg Sa-7.5mg  Patient verbalized understanding of these instructions.       FOLLOW-UP Return in 2 weeks (on 12/06/2016) for Follow up INR at 4PM.  [name], III Pharm.D., CACP

## 2016-11-22 NOTE — Patient Instructions (Signed)
Patient instructed to take medications as defined in the Anti-coagulation Track section of this encounter.  Patient instructed to take today's dose.  Patient instructed to take Su-5mg M-7.5mg T-5mg W-7.5mg Th-5mg F-7.5mg Sa-7.5mg  Patient verbalized understanding of these instructions.

## 2016-11-26 NOTE — Progress Notes (Signed)
INTERNAL MEDICINE TEACHING ATTENDING ADDENDUM - Gust Rung, DO Duration- indefinate, Indication- recurrent VTE, INR-  Lab Results  Component Value Date   INR 4.80 11/22/2016   PROTIME 20.4 (H) 03/16/2011  . Agree with pharmacy recommendations as outlined in their note.

## 2016-12-06 ENCOUNTER — Ambulatory Visit (INDEPENDENT_AMBULATORY_CARE_PROVIDER_SITE_OTHER): Payer: BLUE CROSS/BLUE SHIELD | Admitting: Pharmacist

## 2016-12-06 ENCOUNTER — Encounter (INDEPENDENT_AMBULATORY_CARE_PROVIDER_SITE_OTHER): Payer: Self-pay

## 2016-12-06 DIAGNOSIS — I2699 Other pulmonary embolism without acute cor pulmonale: Secondary | ICD-10-CM

## 2016-12-06 DIAGNOSIS — Z7901 Long term (current) use of anticoagulants: Secondary | ICD-10-CM | POA: Diagnosis not present

## 2016-12-06 LAB — POCT INR: INR: 1.3

## 2016-12-06 NOTE — Progress Notes (Signed)
Anti-Coagulation Progress Note  Richard [name] is a 41 y.o. male who is currently on an anti-coagulation regimen.    RECENT RESULTS: Recent results are below, the most recent result is correlated with a dose of 45 mg. per week: Will increase to 47.5mg  per week. (Previously on  per week--he had his 4.8 INR)  Lab Results  Component Value Date   INR 1.30 12/06/2016   INR 4.80 11/22/2016   INR 2.10 10/25/2016   PROTIME 20.4 (H) 03/16/2011    ANTI-COAG DOSE: Anticoagulation Dose Instructions as of 12/06/2016      [name] Tue Wed Thu Fri Sat   New Dose 5 mg 7.5 mg 7.5 mg 7.5 mg 5 mg 7.5 mg 7.5 mg       ANTICOAG SUMMARY: Anticoagulation Episode Summary    Current INR goal:   2.0-3.0  TTR:   54.8 % (4.6 y)  Next INR check:   12/27/2016  INR from last check:   1.30! (12/06/2016)  Weekly max dose:     Target end date:   Indefinite  INR check location:   [facility]  Preferred lab:     Send INR reminders to:      Indications   Pulmonary embolism and infarction (HCC) [I26.99] D V T [I80.299] Long term (current) use of anticoagulants [Z79.01]       Comments:           ANTICOAG TODAY: Anticoagulation Summary  As of 12/06/2016   INR goal:   2.0-3.0  TTR:     Today's INR:   1.30!  Next INR check:   12/27/2016  Target end date:   Indefinite   Indications   Pulmonary embolism and infarction (HCC) [I26.99] D V T [I80.299] Long term (current) use of anticoagulants [Z79.01]        Anticoagulation Episode Summary    INR check location:   [facility]   Preferred lab:      Send INR reminders to:      Comments:         PATIENT INSTRUCTIONS: Patient Instructions  Patient instructed to take medications as defined in the Anti-coagulation Track section of this encounter.  Patient instructed to take today's dose.  Patient instructed to take 1 & 1/2 tablets of your peach-colored  strength warfarin tablets by mouth, once-daily at [facility] on Mondays, Tuesdays, Wednesdays,  Fridays and Saturdays; on Thursdays and Sundays--take ONLY ONE (1) tablet.  Patient verbalized understanding of these instructions.       FOLLOW-UP Return in 3 weeks (on 12/27/2016) for Follow up INR at 3:45PM.  [name], III Pharm.D., CACP

## 2016-12-06 NOTE — Patient Instructions (Signed)
Patient instructed to take medications as defined in the Anti-coagulation Track section of this encounter.  Patient instructed to take today's dose.  Patient instructed to take 1 & 1/2 tablets of your peach-colored  strength warfarin tablets by mouth, once-daily at [facility] on Mondays, Tuesdays, Wednesdays, Fridays and Saturdays; on Thursdays and Sundays--take ONLY ONE (1) tablet.  Patient verbalized understanding of these instructions.

## 2016-12-13 DIAGNOSIS — H1031 Unspecified acute conjunctivitis, right eye: Secondary | ICD-10-CM | POA: Diagnosis not present

## 2016-12-27 ENCOUNTER — Encounter (INDEPENDENT_AMBULATORY_CARE_PROVIDER_SITE_OTHER): Payer: Self-pay

## 2016-12-27 ENCOUNTER — Ambulatory Visit (INDEPENDENT_AMBULATORY_CARE_PROVIDER_SITE_OTHER): Payer: BLUE CROSS/BLUE SHIELD | Admitting: Pharmacist

## 2016-12-27 DIAGNOSIS — I2699 Other pulmonary embolism without acute cor pulmonale: Secondary | ICD-10-CM

## 2016-12-27 DIAGNOSIS — Z7901 Long term (current) use of anticoagulants: Secondary | ICD-10-CM

## 2016-12-27 LAB — POCT INR: INR: 1.6

## 2016-12-27 MED ORDER — WARFARIN SODIUM 5 MG PO TABS: ORAL_TABLET | ORAL | 2 refills | Status: DC

## 2016-12-27 NOTE — Progress Notes (Signed)
Anti-Coagulation Progress Note  Richard [name] is a 41 y.o. male who is currently on an anti-coagulation regimen for the indications of pulmonary embolism and infarction (HCC) [126.99], DVT [180.299] and long term (current) use of anticoagulants [Z79.01]:   RECENT RESULTS: Recent results are below, the most recent result is correlated with a dose of 47.5 mg. per week: Lab Results  Component Value Date   INR 1.60 12/27/2016   INR 1.30 12/06/2016   INR 4.80 11/22/2016   PROTIME 20.4 (H) 03/16/2011    ANTI-COAG DOSE: Anticoagulation Dose Instructions as of 12/27/2016      [name] Mon Tue Wed Thu Fri Sat   New Dose 7.5 mg 10 mg 7.5 mg 7.5 mg 7.5 mg 7.5 mg 7.5 mg    Description   Patient instructed to take 1 & 1/2 tablets of your 5mg  peach-colored warfarin tablets on all days of week--EXCEPT on MONDAYS--take two (2) tablets of your 5mg  peach-colored warfarin tablets on Mondays.       ANTICOAG SUMMARY: Anticoagulation Episode Summary    Current INR goal:   2.0-3.0  TTR:   54.1 % (4.7 y)  Next INR check:   01/24/2017  INR from last check:   1.60! (12/27/2016)  Weekly max dose:     Target end date:   Indefinite  INR check location:   [facility]  Preferred lab:     Send INR reminders to:      Indications   Pulmonary embolism and infarction (HCC) [I26.99] D V T [I80.299] Long term (current) use of anticoagulants [Z79.01]       Comments:           ANTICOAG TODAY: Anticoagulation Summary  As of 12/27/2016   INR goal:   2.0-3.0  TTR:     Today's INR:   1.60!  Next INR check:   01/24/2017  Target end date:   Indefinite   Indications   Pulmonary embolism and infarction (HCC) [I26.99] D V T [I80.299] Long term (current) use of anticoagulants [Z79.01]        Anticoagulation Episode Summary    INR check location:   [facility]   Preferred lab:      Send INR reminders to:      Comments:         PATIENT INSTRUCTIONS: Patient Instructions  Patient instructed to take  medications as defined in the Anti-coagulation Track section of this encounter.  Patient instructed to take today's dose.  Patient instructed to take 1 & 1/2 tablets of your 5mg  peach-colored warfarin tablets on all days of week--EXCEPT on MONDAYS--take two (2) tablets of your 5mg  peach-colored warfarin tablets on Mondays. Patient verbalized understanding of these instructions.       FOLLOW-UP Return in 4 weeks (on 01/24/2017) for Follow up INR at 4PM.  Hulen LusterJames Lakota Schweppe, III Pharm.D., CACP

## 2016-12-27 NOTE — Patient Instructions (Signed)
Patient instructed to take medications as defined in the Anti-coagulation Track section of this encounter.  Patient instructed to take today's dose.  Patient instructed to take 1 & 1/2 tablets of your 5mg  peach-colored warfarin tablets on all days of week--EXCEPT on MONDAYS--take two (2) tablets of your 5mg  peach-colored warfarin tablets on Mondays. Patient verbalized understanding of these instructions.

## 2016-12-27 NOTE — Progress Notes (Signed)
Indication: Recurrent venous thromboembolism. Duration: Indefinite. INR: Below target. Agree with Dr. Saralyn PilarGroce's assessment and plan.

## 2017-01-24 ENCOUNTER — Ambulatory Visit (INDEPENDENT_AMBULATORY_CARE_PROVIDER_SITE_OTHER): Payer: BLUE CROSS/BLUE SHIELD | Admitting: Pharmacist

## 2017-01-24 DIAGNOSIS — I2699 Other pulmonary embolism without acute cor pulmonale: Secondary | ICD-10-CM | POA: Diagnosis not present

## 2017-01-24 DIAGNOSIS — Z7901 Long term (current) use of anticoagulants: Secondary | ICD-10-CM | POA: Diagnosis not present

## 2017-01-24 LAB — POCT INR: INR: 2.3

## 2017-01-24 MED ORDER — WARFARIN SODIUM 5 MG PO TABS: ORAL_TABLET | ORAL | 2 refills | Status: DC

## 2017-01-24 NOTE — Progress Notes (Signed)
Anti-Coagulation Progress Note  Richard GandyMicha [name] is a 41 y.o. male who is currently on an anti-coagulation regimen.    RECENT RESULTS: Recent results are below, the most recent result is correlated with a dose of 55 mg. per week for the indication of PE and long term use of anticoagulation with an indefinite target date determined. Annual re-assessment and shared decision making for determination of benefits versus risks of continued oral anticoagulation with warfarin should be conducted with the patient and the PCP annually.  Lab Results  Component Value Date   INR 2.30 01/24/2017   INR 1.60 12/27/2016   INR 1.30 12/06/2016   PROTIME 20.4 (H) 03/16/2011    ANTI-COAG DOSE: Anticoagulation Warfarin Dose Instructions as of 01/24/2017      Richard SmilesSun Mon Tue Wed Thu Fri Sat   New Dose 7.5 mg 10 mg 7.5 mg 7.5 mg 10 mg 7.5 mg 7.5 mg    Description   Patient instructed to take 1 & 1/2 tablets of your 5mg  peach-colored warfarin tablets on all days of week--EXCEPT on MONDAYS and THURSDAYS--take two (2) tablets of your 5mg  peach-colored warfarin tablets on Mondays and Thursdays. Richard [name].       ANTICOAG SUMMARY: Anticoagulation Episode Summary    Current INR goal:   2.0-3.0  TTR:   53.9 % (4.7 y)  Next INR check:   02/21/2017  INR from last check:   2.30 (01/24/2017)  Weekly max warfarin dose:     Target end date:   Indefinite  IN[facility] check location:   [facility]  Preferred lab:     Send INR reminders to:      Indications   Pulmonary embolism and infarction (HCC) [I26.99] D V T [I80.299] Long term (current) use of anticoagulants [Z79.01]       Comments:           ANTICOAG TODAY: Anticoagulation Summary  As of 01/24/2017   INR goal:   2.0-3.0  TTR:     Today's INR:   2.30  Next INR check:   02/21/2017  Target end date:   Indefinite   Indications   Pulmonary embolism and infarction (HCC) [I26.99] D V T [I80.299] Long term (current) use of anticoagulants [Z79.01]        Anticoagulation  Episode Summary    INR check location:   [facility]   Preferred lab:      Send INR reminders to:      Comments:         PATIENT INSTRUCTIONS: Patient Instructions  Patient instructed to take medications as defined in the Anti-coagulation Track section of this encounter.  Patient instructed to take today's dose.  Patient instructed to take 1 & 1/2 tablets of your 5mg  peach-colored warfarin tablets on all days of week--EXCEPT on MONDAYS and THURSDAYS--take two (2) tablets of your 5mg  peach-colored warfarin tablets on Mondays and Thursdays. Patient verbalized understanding of these instructions.       FOLLOW-UP Return in 4 weeks (on 02/21/2017) for Follow up INR at 4PM.  Hulen LusterJames Yara Tomkinson, III Pharm.D., CACP

## 2017-01-24 NOTE — Patient Instructions (Signed)
Patient instructed to take medications as defined in the Anti-coagulation Track section of this encounter.  Patient instructed to take today's dose.  Patient instructed to take 1 & 1/2 tablets of your 5mg  peach-colored warfarin tablets on all days of week--EXCEPT on MONDAYS and THURSDAYS--take two (2) tablets of your 5mg  peach-colored warfarin tablets on Mondays and Thursdays. Patient verbalized understanding of these instructions.

## 2017-01-25 NOTE — Progress Notes (Signed)
INTERNAL MEDICINE TEACHING ATTENDING ADDENDUM - Makylie Rivere M.D  Duration- indefinite, Indication- recurrent PE, INR- therapeutic. Agree with pharmacy recommendations as outlined in their note.      

## 2017-02-01 ENCOUNTER — Encounter: Payer: Self-pay | Admitting: *Deleted

## 2017-02-01 ENCOUNTER — Ambulatory Visit (INDEPENDENT_AMBULATORY_CARE_PROVIDER_SITE_OTHER): Payer: BLUE CROSS/BLUE SHIELD | Admitting: Internal Medicine

## 2017-02-01 DIAGNOSIS — Z86711 Personal history of pulmonary embolism: Secondary | ICD-10-CM | POA: Diagnosis not present

## 2017-02-01 DIAGNOSIS — R222 Localized swelling, mass and lump, trunk: Secondary | ICD-10-CM

## 2017-02-01 DIAGNOSIS — Z7901 Long term (current) use of anticoagulants: Secondary | ICD-10-CM | POA: Diagnosis not present

## 2017-02-01 DIAGNOSIS — Z86718 Personal history of other venous thrombosis and embolism: Secondary | ICD-10-CM

## 2017-02-01 DIAGNOSIS — R229 Localized swelling, mass and lump, unspecified: Secondary | ICD-10-CM | POA: Insufficient documentation

## 2017-02-01 NOTE — Assessment & Plan Note (Signed)
Pt has 3 firm skin nodules on exam that he noticed randomly while showering. They do not bother him and have not grown in size. He does not have any consitutional symptoms and is otherwise asymptomatic. Likely deep sebaceous cysts. Advised to call the clinic if nodules grow in size or become tender.

## 2017-02-01 NOTE — Assessment & Plan Note (Signed)
Pt has hx of DVT and then PE that were unprovoked. He denies any difficulty with medication adherence. Continue coumadin.

## 2017-02-01 NOTE — Patient Instructions (Signed)
If your skin nodules get larger or become tender call the clinic for an appointment.

## 2017-02-01 NOTE — Progress Notes (Signed)
   CC: annual follow up for anticoagulation  HPI:  Mr.Milton Foland is a 41 y.o. with PMHx as outlined below who presents to clinic for DVT/PE follow up. Please see problem list for further details of patient's chronic medical issues.   Past Medical History:  Diagnosis Date  . DVT (deep venous thrombosis) (HCC)   . Seasonal allergies     Review of Systems:  Denies SOB, NS, chills, CP, weight loss. He noticed skin nodules randomly and denies any pain or tenderness. States mood is great.   Physical Exam:  Vitals:   02/01/17 1558  BP: 125/67  Pulse: 69  Temp: 98 F (36.7 C)  TempSrc: Oral  SpO2: 98%  Weight: 208 lb 11.2 oz (94.7 kg)   General: Well nourished, NAD Cardiac: RRR, no m/r/g Lungs: CTAB, no wheezing or crackles Skin: 1.5 cm in diameter firm nodules deep beneath skin w/o any color changes or tenderness to palpation located at left flank region, left lateral/side region and left upper abd quadant.   Assessment & Plan:   See Encounters Tab for problem based charting.  Patient discussed with Dr. Rogelia BogaButcher

## 2017-02-04 NOTE — Progress Notes (Signed)
Internal Medicine Clinic Attending  Case discussed with Dr. Truong at the time of the visit.  We reviewed the resident's history and exam and pertinent patient test results.  I agree with the assessment, diagnosis, and plan of care documented in the resident's note.  

## 2017-02-21 ENCOUNTER — Ambulatory Visit (INDEPENDENT_AMBULATORY_CARE_PROVIDER_SITE_OTHER): Payer: BLUE CROSS/BLUE SHIELD | Admitting: Pharmacist

## 2017-02-21 DIAGNOSIS — I2699 Other pulmonary embolism without acute cor pulmonale: Secondary | ICD-10-CM

## 2017-02-21 DIAGNOSIS — Z7901 Long term (current) use of anticoagulants: Secondary | ICD-10-CM | POA: Diagnosis not present

## 2017-02-21 LAB — POCT INR: INR: 2.5

## 2017-02-21 MED ORDER — WARFARIN SODIUM 5 MG PO TABS: ORAL_TABLET | ORAL | 2 refills | Status: DC

## 2017-02-21 NOTE — Patient Instructions (Signed)
Patient educated about medication as defined in this encounter and verbalized understanding by repeating back instructions provided.   

## 2017-02-21 NOTE — Progress Notes (Signed)
INTERNAL MEDICINE TEACHING ATTENDING ADDENDUM - Gust RungHoffman, Everlean Bucher C, DO Duration- indefinate, Indication- recurrent VTE, INR-  Lab Results  Component Value Date   INR 2.5 02/21/2017   PROTIME 20.4 (H) 03/16/2011  . Agree with pharmacy recommendations as outlined in their note.

## 2017-02-21 NOTE — Progress Notes (Signed)
Anticoagulation Management Richard BirksMicha Normand SloopDillard is a 41 y.o. male who reports to the clinic for monitoring of warfarin treatment.    Indication: history of DVT and PE Duration: indefinite Supervising physician: Carlynn PurlErik Hoffman  Anticoagulation Clinic Visit History: Patient does not report signs/symptoms of bleeding or thromboembolism  Anticoagulation Episode Summary    Current INR goal:   2.0-3.0  TTR:   54.6 % (4.8 y)  Next INR check:   03/28/2017  INR from last check:     Weekly max warfarin dose:     Target end date:   Indefinite  INR check location:   [facility]  Preferred lab:     Send INR reminders to:      Indications   Pulmonary embolism and infarction (HCC) [I26.99] D V T [I80.299] Long term (current) use of anticoagulants [Z79.01]       Comments:          ASSESSMENT Recent Results: The most recent result is correlated with 57.5 mg per week: Lab Results  Component Value Date   INR 2.5 02/21/2017   INR 2.30 01/24/2017   INR 1.60 12/27/2016   PROTIME 20.4 (H) 03/16/2011   Anticoagulation Dosing: INR as of 02/21/2017 and Previous Warfarin Dosing Information    INR Dt INR Goal Wkly Tot [name]     2.0-3.0 57.5 mg 7.5 mg 10 mg 7.5 mg 7.5 mg 10 mg 7.5 mg 7.5 mg    Previous description   Patient instructed to take 1 & 1/2 tablets of your 5mg  peach-colored warfarin tablets on all days of week--EXCEPT on MONDAYS and THURSDAYS--take two (2) tablets of your 5mg  peach-colored warfarin tablets on Mondays and Thursdays. .    Anticoagulation Warfarin Dose Instructions as of 02/21/2017      Total [name]   New Dose 57.5 mg 7.5 mg 10 mg 7.5 mg 7.5 mg 10 mg 7.5 mg 7.5 mg     (5 mg x 1.5)  (5 mg x 2)  (5 mg x 1.5)  (5 mg x 1.5)  (5 mg x 2)  (5 mg x 1.5)  (5 mg x 1.5)                         Description   Patient instructed to take 1 & 1/2 tablets of your 5mg  peach-colored warfarin tablets on all days of week--EXCEPT on MONDAYS and  THURSDAYS--take two (2) tablets of your 5mg  peach-colored warfarin tablets on Mondays and Thursdays. .      INR today: Therapeutic  PLAN Weekly dose was unchanged  Patient Instructions  Patient educated about medication as defined in this encounter and verbalized understanding by repeating back instructions provided.   Patient advised to contact clinic or seek medical attention if signs/symptoms of bleeding or thromboembolism occur.  Patient verbalized understanding by repeating back information and was advised to contact me if further medication-related questions arise. Patient was also provided an information handout.  Follow-up Return in about 5 weeks (around 03/28/2017).  Marzetta BoardJennifer Kim

## 2017-03-08 ENCOUNTER — Telehealth: Payer: Self-pay

## 2017-03-08 NOTE — Telephone Encounter (Signed)
Patient called reports his dentist [facility] be [facility]. Dr. Rogelia BogaButcher printed INR results triage nurse faxed results to [facility] advised patient to call [facility] to see if they needed anything else if so [facility] with needs questions or concerns.

## 2017-03-28 ENCOUNTER — Ambulatory Visit (INDEPENDENT_AMBULATORY_CARE_PROVIDER_SITE_OTHER): Payer: BLUE CROSS/BLUE SHIELD | Admitting: Pharmacist

## 2017-03-28 DIAGNOSIS — I2699 Other pulmonary embolism without acute cor pulmonale: Secondary | ICD-10-CM | POA: Diagnosis not present

## 2017-03-28 DIAGNOSIS — Z7901 Long term (current) use of anticoagulants: Secondary | ICD-10-CM | POA: Diagnosis not present

## 2017-03-28 LAB — POCT INR: INR: 2.9

## 2017-03-28 NOTE — Progress Notes (Signed)
Anticoagulation Management Richard BirksMicha Richard [name] is a 41 y.o. male who reports to the clinic for monitoring of warfarin treatment.    Indication: PE  Duration: indefinite Supervising physician: Earl LagosNischal Narendra  Anticoagulation Clinic Visit History: Patient does not report signs/symptoms of bleeding or thromboembolism  Other recent changes: No diet, medications, lifestyle changes endorsed to me by the patient.  Anticoagulation Episode Summary    Current INR goal:   2.0-3.0  TTR:   55.5 % (4.9 y)  Next INR check:   04/18/2017  INR from last check:   2.90 (03/28/2017)  Weekly max warfarin dose:     Target end date:   Indefinite  INR check location:   [facility]  Preferred lab:     Send INR reminders to:      Indications   Pulmonary embolism and infarction (HCC) [I26.99] D V T [I80.299] Long term (current) use of anticoagulants [Z79.01]       Comments:           No Known Allergies Prior to Admission medications   Medication Sig Start Date End Date Taking? Authorizing Provider  cetirizine (ZYRTEC) 10 MG tablet Take 1 tablet (10 mg total) by mouth daily. 08/19/15  Yes Denton Brickruong, Diana M, MD  warfarin (COUMADIN) 5 MG tablet Take 1&1/2 tablets on all days of week--EXCEPT on MONDAYS and THURSDAYS--take TWO (2) tablets on Mondays and Thursdays. 02/21/17  Yes Gust RungHoffman, Erik C, DO   Past Medical History:  Diagnosis Date  . DVT (deep venous thrombosis) (HCC)   . Seasonal allergies    Social History   Social History  . Marital status: Single    Spouse name: N/A  . Number of children: N/A  . Years of education: N/A   Social History Main Topics  . Smoking status: Former Smoker    Years: 1.00    Types: Cigarettes, Cigars    Quit date: 04/05/2012  . Smokeless tobacco: Not on file  . Alcohol use No  . Drug use: No  . Sexual activity: Not on file   Other Topics Concern  . Not on file   Social History Narrative  . No narrative on file   Family History  Problem Relation Age of Onset   . Other Mother   . Other Father   . Pulmonary embolism Maternal Uncle     ASSESSMENT Recent Results: The most recent result is correlated with 55 mg per week: Lab Results  Component Value Date   INR 2.90 03/28/2017   INR 2.5 02/21/2017   INR 2.30 01/24/2017   PROTIME 20.4 (H) 03/16/2011    Anticoagulation Dosing: INR as of 03/28/2017 and Previous Warfarin Dosing Information    INR Dt INR Goal Wkly Tot [name]   03/28/2017 2.90 2.0-3.0 57.5 mg 7.5 mg 10 mg 7.5 mg 7.5 mg 10 mg 7.5 mg 7.5 mg    Previous description   Patient instructed to take 1 & 1/2 tablets of your 5mg  peach-colored warfarin tablets on all days of week--EXCEPT on MONDAYS and THURSDAYS--take two (2) tablets of your 5mg  peach-colored warfarin tablets on Mondays and Thursdays. .    Anticoagulation Warfarin Dose Instructions as of 03/28/2017      Total [name]   New Dose 52.5 mg 7.5 mg 7.5 mg 7.5 mg 7.5 mg 7.5 mg 7.5 mg 7.5 mg     (5 mg x 1.5)  (5 mg x 1.5)  (5 mg x 1.5)  (  5 mg x 1.5)  (5 mg x 1.5)  (5 mg x 1.5)  (5 mg x 1.5)                         Description   Patient instructed to take 1 & 1/2 tablets of your 5mg  peach-colored warfarin tablets on all days of week.      INR today: Therapeutic  PLAN Weekly dose was decreased to 52.5mg /wk and one omitted dose for this week.  Patient Instructions  Patient instructed to take medications as defined in the Anti-coagulation Track section of this encounter.  Patient instructed to OMIT today's dose.  Patient instructed to recommence one-day after omitting dose. Patient instructed to take 1 & 1/2 tablets of your 5mg  peach-colored warfarin tablets on all days of week. Patient verbalized understanding of these instructions.     Patient advised to contact clinic or seek medical attention if signs/symptoms of bleeding or thromboembolism occur.  Patient verbalized understanding by repeating back information and was advised  to contact me if further medication-related questions arise. Patient was also provided an information handout.  Follow-up Return in about 3 weeks (around 04/18/2017) for Follow up INR at 4PM.  Janie Morning, Ricke Hey PharmD, CACP, CPP  15 minutes spent face-to-face with the patient during the encounter. 50% of time spent on education. 50% of time was spent on fingerstick point of care INR sample collection, processing, results interpretation, data-entry in to EPIC/CHL and [email or web address].

## 2017-03-28 NOTE — Patient Instructions (Signed)
Patient instructed to take medications as defined in the Anti-coagulation Track section of this encounter.  Patient instructed to OMIT today's dose.  Patient instructed to recommence one-day after omitting dose. Patient instructed to take 1 & 1/2 tablets of your 5mg  peach-colored warfarin tablets on all days of week. Patient verbalized understanding of these instructions.

## 2017-03-30 NOTE — Progress Notes (Signed)
INTERNAL MEDICINE TEACHING ATTENDING ADDENDUM - Lynel Forester M.D  Duration- indefinite, Indication- recurrent VTE, INR- therapeutic. Agree with pharmacy recommendations as outlined in their note.     

## 2017-04-03 DIAGNOSIS — B354 Tinea corporis: Secondary | ICD-10-CM | POA: Diagnosis not present

## 2017-04-03 DIAGNOSIS — L0291 Cutaneous abscess, unspecified: Secondary | ICD-10-CM | POA: Diagnosis not present

## 2017-04-03 DIAGNOSIS — L731 Pseudofolliculitis barbae: Secondary | ICD-10-CM | POA: Diagnosis not present

## 2017-04-03 DIAGNOSIS — L0201 Cutaneous abscess of face: Secondary | ICD-10-CM | POA: Diagnosis not present

## 2017-04-05 ENCOUNTER — Other Ambulatory Visit: Payer: Self-pay | Admitting: *Deleted

## 2017-04-05 MED ORDER — VALACYCLOVIR HCL 500 MG PO TABS: 500.0000 mg | ORAL_TABLET | Freq: Two times a day (BID) | ORAL | 1 refills | Status: AC

## 2017-04-05 NOTE — Telephone Encounter (Signed)
Wants this med today, states [place]

## 2017-04-18 ENCOUNTER — Ambulatory Visit (INDEPENDENT_AMBULATORY_CARE_PROVIDER_SITE_OTHER): Payer: BLUE CROSS/BLUE SHIELD | Admitting: Pharmacist

## 2017-04-18 DIAGNOSIS — Z7901 Long term (current) use of anticoagulants: Secondary | ICD-10-CM | POA: Diagnosis not present

## 2017-04-18 DIAGNOSIS — I2699 Other pulmonary embolism without acute cor pulmonale: Secondary | ICD-10-CM | POA: Diagnosis not present

## 2017-04-18 DIAGNOSIS — Z87891 Personal history of nicotine dependence: Secondary | ICD-10-CM

## 2017-04-18 DIAGNOSIS — Z79899 Other long term (current) drug therapy: Secondary | ICD-10-CM

## 2017-04-18 LAB — POCT INR: INR: 4.8

## 2017-04-18 MED ORDER — WARFARIN SODIUM 5 MG PO TABS: ORAL_TABLET | ORAL | 2 refills | Status: DC

## 2017-04-18 NOTE — Patient Instructions (Signed)
Patient instructed to take medications as defined in the Anti-coagulation Track section of this encounter.  Patient instructed to OMIT/HOLD today's dose.  Patient instructed to OMIT/HOLD today's dose. Commence tomorrow taking 1 & 1/2 tablets of your 5mg  peach-colored warfarin tablets on all days of week--EXCEPT on TUESDAYS, THURSDAYS and SATURDAYS--take ONLY ONE (1) tablet on these days. Patient verbalized understanding of these instructions.

## 2017-04-18 NOTE — Progress Notes (Signed)
Anticoagulation Management Richard [name] is a 41 y.o. male who reports to the clinic for monitoring of warfarin treatment.    Indication: PE, history of; DVT, history of; long term (current) use of anticoagulants.  Duration: indefinite Supervising physician: Earl Lagos  Anticoagulation Clinic Visit History: Patient does not report signs/symptoms of bleeding or thromboembolism  Other recent changes: No diet, medications, lifestyle changes endorsed by the patient to me.  Anticoagulation Episode Summary    Current INR goal:   2.0-3.0  TTR:   54.9 % (5 y)  Next INR check:   05/09/2017  INR from last check:   4.80! (04/18/2017)  Weekly max warfarin dose:     Target end date:   Indefinite  INR check location:   [facility]  Preferred lab:     Send INR reminders to:      Indications   Pulmonary embolism and infarction (HCC) [I26.99] D V T [I80.299] Long term (current) use of anticoagulants [Z79.01]       Comments:           No Known Allergies Prior to Admission medications   Medication Sig Start Date End Date Taking? Authorizing Provider  cetirizine (ZYRTEC) 10 MG tablet Take 1 tablet (10 mg total) by mouth daily. 08/19/15  Yes Denton Brick, MD  warfarin (COUMADIN) 5 MG tablet Take 1&1/2 tablets on all days of week--EXCEPT on TUESDAYS, THURSDAYS and SATURDAYS; take ONE (1) tablet on those days. 04/18/17  Yes Earl Lagos, MD   Past Medical History:  Diagnosis Date  . DVT (deep venous thrombosis) (HCC)   . Seasonal allergies    Social History   Social History  . Marital status: Single    Spouse name: N/A  . Number of children: N/A  . Years of education: N/A   Social History Main Topics  . Smoking status: Former Smoker    Years: 1.00    Types: Cigarettes, Cigars    Quit date: 04/05/2012  . Smokeless tobacco: Not on file  . Alcohol use No  . Drug use: No  . Sexual activity: Not on file   Other Topics Concern  . Not on file   Social History Narrative   . No narrative on file   Family History  Problem Relation Age of Onset  . Other Mother   . Other Father   . Pulmonary embolism Maternal Uncle     ASSESSMENT Recent Results: The most recent result is correlated with 52.5 mg per week: Lab Results  Component Value Date   INR 4.80 04/18/2017   INR 2.90 03/28/2017   INR 2.5 02/21/2017   PROTIME 20.4 (H) 03/16/2011    Anticoagulation Dosing: INR as of 04/18/2017 and Previous Warfarin Dosing Information    INR Dt INR Goal Wkly Tot [name]   04/18/2017 4.80 2.0-3.0 52.5 mg 7.5 mg 7.5 mg 7.5 mg 7.5 mg 7.5 mg 7.5 mg 7.5 mg    Previous description   Patient instructed to take 1 & 1/2 tablets of your 5mg  peach-colored warfarin tablets on all days of week.    Anticoagulation Warfarin Dose Instructions as of 04/18/2017      Total [name]   New Dose 45 mg 7.5 mg 7.5 mg 5 mg 7.5 mg 5 mg 7.5 mg 5 mg     (5 mg x 1.5)  (5 mg x 1.5)  (5 mg x 1)  (5 mg x 1.5)  (5 mg  x 1)  (5 mg x 1.5)  (5 mg x 1)                         Description   Patient instructed to OMIT/HOLD today's dose. Commence tomorrow taking 1 & 1/2 tablets of your 5mg  peach-colored warfarin tablets on all days of week--EXCEPT on TUESDAYS, THURSDAYS and SATURDAYS--take ONLY ONE (1) tablet on these days.      INR today: Supratherapeutic  PLAN Weekly dose was decreased by 15% to 45 mg per week  Patient Instructions  Patient instructed to take medications as defined in the Anti-coagulation Track section of this encounter.  Patient instructed to OMIT/HOLD today's dose.  Patient instructed to OMIT/HOLD today's dose. Commence tomorrow taking 1 & 1/2 tablets of your 5mg  peach-colored warfarin tablets on all days of week--EXCEPT on TUESDAYS, THURSDAYS and SATURDAYS--take ONLY ONE (1) tablet on these days. Patient verbalized understanding of these instructions.     Patient advised to contact clinic or seek medical attention if  signs/symptoms of bleeding or thromboembolism occur.  Patient verbalized understanding by repeating back information and was advised to contact me if further medication-related questions arise. Patient was also provided an information handout.  Follow-up Return in 3 weeks (on 05/09/2017) for Follow up INR at 4PM.  [name] PharmD, CACP, CPP  15 minutes spent face-to-face with the patient during the encounter. 50% of time spent on education. 50% of time was spent on fingerstick point of care INR sample collection, processing, results interpretation and dose adjustment with documentation in to EPIC/CHL and [email or web address].

## 2017-04-19 NOTE — Progress Notes (Signed)
INTERNAL MEDICINE TEACHING ATTENDING ADDENDUM - Earl Lagos M.D  Duration- indefinite, Indication- recurrent PE, DVT, INR- supratherapeutic. Agree with pharmacy recommendations as outlined in their note.

## 2017-05-09 ENCOUNTER — Ambulatory Visit (INDEPENDENT_AMBULATORY_CARE_PROVIDER_SITE_OTHER): Payer: BLUE CROSS/BLUE SHIELD | Admitting: Pharmacist

## 2017-05-09 DIAGNOSIS — I2699 Other pulmonary embolism without acute cor pulmonale: Secondary | ICD-10-CM

## 2017-05-09 DIAGNOSIS — Z7901 Long term (current) use of anticoagulants: Secondary | ICD-10-CM

## 2017-05-09 LAB — POCT INR: INR: 2.8

## 2017-05-09 NOTE — Patient Instructions (Signed)
Patient instructed to take medications as defined in the Anti-coagulation Track section of this encounter.  Patient instructed to take today's dose.  Patient instructed to take 1 & 1/2 tablets of your  peach-colored warfarin tablets on all days of week--EXCEPT on TUESDAYS, THURSDAYS and SATURDAYS--take ONLY ONE (1) tablet on these days. Patient verbalized understanding of these instructions.

## 2017-05-09 NOTE — Progress Notes (Signed)
Anticoagulation Management Richard [name] is a 41 y.o. male who reports to the clinic for monitoring of warfarin treatment.    Indication: PE , history of (HCC) [126.99], DVT, history of ; long term use of anticoagulants. Duration: indefinite Supervising physician: Carlynn Purl  Anticoagulation Clinic Visit History: Patient does not report signs/symptoms of bleeding or thromboembolism  Other recent changes: No diet, medications, lifestyle changes endorsed by the patient to me.  Anticoagulation Episode Summary    Current INR goal:   2.0-3.0  TTR:   54.4 % (5 y)  Next INR check:   06/06/2017  INR from last check:   2.80 (05/09/2017)  Weekly max warfarin dose:     Target end date:   Indefinite  INR check location:   [facility]  Preferred lab:     Send INR reminders to:      Indications   Pulmonary embolism and infarction (HCC) [I26.99] D V T [I80.299] Long term (current) use of anticoagulants [Z79.01]       Comments:           No Known Allergies Prior to Admission medications   Medication Sig Start Date End Date Taking? Authorizing Provider  cetirizine (ZYRTEC) 10 MG tablet Take 1 tablet (10 mg total) by mouth daily. 08/19/15  Yes Denton Brick, MD  warfarin (COUMADIN) 5 MG tablet Take 1&1/2 tablets on all days of week--EXCEPT on TUESDAYS, THURSDAYS and SATURDAYS; take ONE (1) tablet on those days. 04/18/17  Yes Earl Lagos, MD   Past Medical History:  Diagnosis Date  . DVT (deep venous thrombosis) (HCC)   . Seasonal allergies    Social History   Social History  . Marital status: Single    Spouse name: N/A  . Number of children: N/A  . Years of education: N/A   Social History Main Topics  . Smoking status: Former Smoker    Years: 1.00    Types: Cigarettes, Cigars    Quit date: 04/05/2012  . Smokeless tobacco: Not on file  . Alcohol use No  . Drug use: No  . Sexual activity: Not on file   Other Topics Concern  . Not on file   Social History  Narrative  . No narrative on file   Family History  Problem Relation Age of Onset  . Other Mother   . Other Father   . Pulmonary embolism Maternal Uncle     ASSESSMENT Recent Results: The most recent result is correlated with 45 mg per week: Lab Results  Component Value Date   INR 2.80 05/09/2017   INR 4.80 04/18/2017   INR 2.90 03/28/2017   PROTIME 20.4 (H) 03/16/2011    Anticoagulation Dosing: INR as of 05/09/2017 and Previous Warfarin Dosing Information    INR Dt INR Goal Wkly Tot [name]   05/09/2017 2.80 2.0-3.0 45 mg 7.5 mg 7.5 mg 5 mg 7.5 mg 5 mg 7.5 mg 5 mg    Previous description   Patient instructed to OMIT/HOLD today's dose. Commence tomorrow taking 1 & 1/2 tablets of your  peach-colored warfarin tablets on all days of week--EXCEPT on TUESDAYS, THURSDAYS and SATURDAYS--take ONLY ONE (1) tablet on these days.    Anticoagulation Warfarin Dose Instructions as of 05/09/2017      Total [name]   New Dose 45 mg 7.5 mg 7.5 mg 5 mg 7.5 mg 5 mg 7.5 mg 5 mg     (5 mg x  1.5)  (5 mg x 1.5)  (5 mg x 1)  (5 mg x 1.5)  (5 mg x 1)  (5 mg x 1.5)  (5 mg x 1)                         Description   Patient instructed to take 1 & 1/2 tablets of your  peach-colored warfarin tablets on all days of week--EXCEPT on TUESDAYS, THURSDAYS and SATURDAYS--take ONLY ONE (1) tablet on these days.      INR today: Therapeutic  PLAN Weekly dose was unchanged.   Patient Instructions  Patient instructed to take medications as defined in the Anti-coagulation Track section of this encounter.  Patient instructed to take today's dose.  Patient instructed to take 1 & 1/2 tablets of your  peach-colored warfarin tablets on all days of week--EXCEPT on TUESDAYS, THURSDAYS and SATURDAYS--take ONLY ONE (1) tablet on these days. Patient verbalized understanding of these instructions.     Patient advised to contact clinic or seek medical attention if  signs/symptoms of bleeding or thromboembolism occur.  Patient verbalized understanding by repeating back information and was advised to contact me if further medication-related questions arise. Patient was also provided an information handout.  Follow-up Return in 4 weeks (on 06/06/2017) for Follow up INR at 4:30PM.  [name], PharmD, CACP, CPP  15 minutes spent face-to-face with the patient during the encounter. 50% of time spent on education. 50% of time was spent on point of care fingerstick INR sample collection, processing, results determination and interpretation and documentation in [email or web address].

## 2017-05-10 NOTE — Progress Notes (Signed)
INTERNAL MEDICINE TEACHING ATTENDING ADDENDUM - Gust Rung, DO Duration- indefinate, Indication- PE/DVT, INR-  Lab Results  Component Value Date   INR 2.80 05/09/2017   PROTIME 20.4 (H) 03/16/2011  . Agree with pharmacy recommendations as outlined in their note.

## 2017-06-06 ENCOUNTER — Ambulatory Visit (INDEPENDENT_AMBULATORY_CARE_PROVIDER_SITE_OTHER): Payer: BLUE CROSS/BLUE SHIELD | Admitting: Pharmacist

## 2017-06-06 DIAGNOSIS — I2699 Other pulmonary embolism without acute cor pulmonale: Secondary | ICD-10-CM | POA: Diagnosis not present

## 2017-06-06 DIAGNOSIS — Z7901 Long term (current) use of anticoagulants: Secondary | ICD-10-CM

## 2017-06-06 LAB — POCT INR: INR: 2.7

## 2017-06-06 MED ORDER — WARFARIN SODIUM 5 MG PO TABS: ORAL_TABLET | ORAL | 2 refills | Status: DC

## 2017-06-06 NOTE — Patient Instructions (Signed)
Patient instructed to take medications as defined in the Anti-coagulation Track section of this encounter.  Patient instructed to take today's dose.  Patient instructed to take 1 & 1/2 tablets of your  peach-colored warfarin tablets on Mondays and Fridays--ALL other days--take only one (1) tablet of your  peach-colored warfarin tablets.  Patient verbalized understanding of these instructions.

## 2017-06-06 NOTE — Progress Notes (Signed)
Anticoagulation Management Richard [name] is a 41 y.o. male who reports to the clinic for monitoring of warfarin treatment.    Indication: PE, history of; DVT, history of; Long term (current use) of anticoagulants.  Duration: indefinite Supervising physician: Blanch Media  Anticoagulation Clinic Visit History: Patient does not report signs/symptoms of bleeding or thromboembolism  Other recent changes: No diet, medications, lifestyle changes endorsed by the patient to me.  Anticoagulation Episode Summary    Current INR goal:   2.0-3.0  TTR:   55.1 % (5.1 y)  Next INR check:   07/04/2017  INR from last check:   2.70 (06/06/2017)  Weekly max warfarin dose:     Target end date:   Indefinite  INR check location:   [facility]  Preferred lab:     Send INR reminders to:      Indications   Pulmonary embolism and infarction (HCC) [I26.99] D V T [I80.299] Long term (current) use of anticoagulants [Z79.01]       Comments:           No Known Allergies Prior to Admission medications   Medication Sig Start Date End Date Taking? Authorizing Provider  cetirizine (ZYRTEC) 10 MG tablet Take 1 tablet (10 mg total) by mouth daily. 08/19/15  Yes Denton Brick, MD  warfarin (COUMADIN) 5 MG tablet Take 1&1/2 tablets on Mondays and Fridays--all other days of week, take ONE (1) tablet on those days. 06/06/17  Yes Burns Spain, MD   Past Medical History:  Diagnosis Date  . DVT (deep venous thrombosis) (HCC)   . Seasonal allergies    Social History   Social History  . Marital status: Single    Spouse name: N/A  . Number of children: N/A  . Years of education: N/A   Social History Main Topics  . Smoking status: Former Smoker    Years: 1.00    Types: Cigarettes, Cigars    Quit date: 04/05/2012  . Smokeless tobacco: Not on file  . Alcohol use No  . Drug use: No  . Sexual activity: Not on file   Other Topics Concern  . Not on file   Social History Narrative  . No  narrative on file   Family History  Problem Relation Age of Onset  . Other Mother   . Other Father   . Pulmonary embolism Maternal Uncle     ASSESSMENT Recent Results: The most recent result is correlated with 45 mg per week: Lab Results  Component Value Date   INR 2.70 06/06/2017   INR 2.80 05/09/2017   INR 4.80 04/18/2017   PROTIME 20.4 (H) 03/16/2011    Anticoagulation Dosing: INR as of 06/06/2017 and Previous Warfarin Dosing Information    INR Dt INR Goal Wkly Tot [name]   06/06/2017 2.70 2.0-3.0 45 mg 7.5 mg 7.5 mg 5 mg 7.5 mg 5 mg 7.5 mg 5 mg    Previous description   Patient instructed to take 1 & 1/2 tablets of your  peach-colored warfarin tablets on all days of week--EXCEPT on TUESDAYS, THURSDAYS and SATURDAYS--take ONLY ONE (1) tablet on these days.    Anticoagulation Warfarin Dose Instructions as of 06/06/2017      Total [name]   New Dose 40 mg 7.5 mg 5 mg 5 mg 5 mg 5 mg 7.5 mg 5 mg     (5 mg x 1.5)  (5 mg x 1)  (5  mg x 1)  (5 mg x 1)  (5 mg x 1)  (5 mg x 1.5)  (5 mg x 1)                         Description   Patient instructed to take 1 & 1/2 tablets of your  peach-colored warfarin tablets on Mondays and Fridays--ALL other days--take only one (1) tablet of your  peach-colored warfarin tablets.      INR today: Therapeutic  PLAN Weekly dose was decreased by 11% to 40 mg per week  Patient Instructions  Patient instructed to take medications as defined in the Anti-coagulation Track section of this encounter.  Patient instructed to take today's dose.  Patient instructed to take 1 & 1/2 tablets of your  peach-colored warfarin tablets on Mondays and Fridays--ALL other days--take only one (1) tablet of your  peach-colored warfarin tablets.  Patient verbalized understanding of these instructions.     Patient advised to contact clinic or seek medical attention if signs/symptoms of bleeding or  thromboembolism occur.  Patient verbalized understanding by repeating back information and was advised to contact me if further medication-related questions arise. Patient was also provided an information handout.  Follow-up Return in about 4 weeks (around 07/04/2017) for Follow up INR at 4:15PM.  [name], PharmD, CACP, CPP  15 minutes spent face-to-face with the patient during the encounter. 50% of time spent on education. 50% of time was spent on fingerstick point of care INR sample collection, processing, results determination and dose adjustment, documentation in EPIC/CHL and [email or web address].

## 2017-07-04 ENCOUNTER — Ambulatory Visit (INDEPENDENT_AMBULATORY_CARE_PROVIDER_SITE_OTHER): Payer: BLUE CROSS/BLUE SHIELD | Admitting: Pharmacist

## 2017-07-04 DIAGNOSIS — Z86711 Personal history of pulmonary embolism: Secondary | ICD-10-CM | POA: Diagnosis not present

## 2017-07-04 DIAGNOSIS — Z7901 Long term (current) use of anticoagulants: Secondary | ICD-10-CM

## 2017-07-04 DIAGNOSIS — I2699 Other pulmonary embolism without acute cor pulmonale: Secondary | ICD-10-CM

## 2017-07-04 LAB — POCT INR: INR: 1.7

## 2017-07-04 MED ORDER — WARFARIN SODIUM 5 MG PO TABS: ORAL_TABLET | ORAL | 2 refills | Status: DC

## 2017-07-04 NOTE — Progress Notes (Signed)
Anticoagulation Management Richard BirksMicha Normand [name] is a 41 y.o. male who reports to the clinic for monitoring of warfarin treatment.    Indication: PE , history of; long term current use of anticoagulant.  Duration: indefinite Supervising physician: Carlynn PurlErik Hoffman  Anticoagulation Clinic Visit History: Patient does not report signs/symptoms of bleeding or thromboembolism  Other recent changes: No diet, medications, lifestyle changes endorsed by the patient to me.  Anticoagulation Episode Summary    Current INR goal:   2.0-3.0  TTR:   55.3 % (5.2 y)  Next INR check:   08/01/2017  INR from last check:   1.70! (07/04/2017)  Weekly max warfarin dose:     Target end date:   Indefinite  INR check location:   [facility]  Preferred lab:     Send INR reminders to:      Indications   Pulmonary embolism and infarction (HCC) [I26.99] D V T [I80.299] Long term (current) use of anticoagulants [Z79.01]       Comments:           No Known Allergies Prior to Admission medications   Medication Sig Start Date End Date Taking? Authorizing Provider  cetirizine (ZYRTEC) 10 MG tablet Take 1 tablet (10 mg total) by mouth daily. 08/19/15  Yes Denton Brickruong, Diana M, MD  warfarin (COUMADIN) 5 MG tablet Take 1&1/2 tablets on Mondays, Tuesdays, Wednesdays and Fridays--all other days of week, take ONE (1) tablet on those days. 07/04/17  Yes Elicia LampGroce, Yvonnia Tango B, RPH-CPP   Past Medical History:  Diagnosis Date  . DVT (deep venous thrombosis) (HCC)   . Seasonal allergies    Social History   Socioeconomic History  . Marital status: Single    Spouse name: Not on file  . Number of children: Not on file  . Years of education: Not on file  . Highest education level: Not on file  Social Needs  . Financial resource strain: Not on file  . Food insecurity - worry: Not on file  . Food insecurity - inability: Not on file  . Transportation needs - medical: Not on file  . Transportation needs - non-medical: Not on file   Occupational History  . Not on file  Tobacco Use  . Smoking status: Former Smoker    Years: 1.00    Types: Cigarettes, Cigars    Last attempt to quit: 04/05/2012    Years since quitting: 5.2  Substance and Sexual Activity  . Alcohol use: No    Alcohol/week: 0.0 oz  . Drug use: No  . Sexual activity: Not on file  Other Topics Concern  . Not on file  Social History Narrative  . Not on file   Family History  Problem Relation Age of Onset  . Other Mother   . Other Father   . Pulmonary embolism Maternal Uncle     ASSESSMENT Recent Results: The most recent result is correlated with 40 mg per week: Lab Results  Component Value Date   INR 1.70 07/04/2017   INR 2.70 06/06/2017   INR 2.80 05/09/2017   PROTIME 20.4 (H) 03/16/2011    Anticoagulation Dosing: Description   Patient instructed to take 1 & 1/2 tablets of your 5mg  peach-colored warfarin tablets on Mondays, Tuesdays, Wednesdays and Fridays--ALL other days--take only one (1) tablet of your 5mg  peach-colored warfarin tablets.      INR today: Subtherapeutic  PLAN Weekly dose was increased by 13% to 45 mg per week  Patient Instructions  Patient instructed to take medications as  defined in the Anti-coagulation Track section of this encounter.  Patient instructed to take today's dose.  Patient instructed to take 1 & 1/2 tablets of your 5mg  peach-colored warfarin tablets on Mondays, Tuesdays, Wednesdays and Fridays--ALL other days--take only one (1) tablet of your 5mg  peach-colored warfarin tablets.  Patient verbalized understanding of these instructions.     Patient advised to contact clinic or seek medical attention if signs/symptoms of bleeding or thromboembolism occur.  Patient verbalized understanding by repeating back information and was advised to contact me if further medication-related questions arise. Patient was also provided an information handout.  Follow-up Return in about 4 weeks (around 08/01/2017)  for Follow up INR at 4:30PM.  Elicia LampJames B Cormac Wint, PharmD, CACP, CPP  15 minutes spent face-to-face with the patient during the encounter. 50% of time spent on education. 50% of time was spent on fingerstick point of care INR sample collection, processing, results determination, dose adjustment and documentation in [email or web address].

## 2017-07-04 NOTE — Patient Instructions (Signed)
Patient instructed to take medications as defined in the Anti-coagulation Track section of this encounter.  Patient instructed to take today's dose.  Patient instructed to take 1 & 1/2 tablets of your 5mg  peach-colored warfarin tablets on Mondays, Tuesdays, Wednesdays and Fridays--ALL other days--take only one (1) tablet of your 5mg  peach-colored warfarin tablets.  Patient verbalized understanding of these instructions.

## 2017-07-06 NOTE — Progress Notes (Signed)
INTERNAL MEDICINE TEACHING ATTENDING ADDENDUM - Gust RungHoffman, Erik C, DO Duration- indefinate, Indication- PE/DVT, INR-  Lab Results  Component Value Date   INR 1.70 07/04/2017   PROTIME 20.4 (H) 03/16/2011  . Agree with pharmacy recommendations as outlined in their note.

## 2017-08-01 ENCOUNTER — Ambulatory Visit: Payer: BLUE CROSS/BLUE SHIELD

## 2017-08-08 ENCOUNTER — Ambulatory Visit: Payer: BLUE CROSS/BLUE SHIELD

## 2017-08-08 ENCOUNTER — Ambulatory Visit (INDEPENDENT_AMBULATORY_CARE_PROVIDER_SITE_OTHER): Payer: BLUE CROSS/BLUE SHIELD | Admitting: Pharmacist

## 2017-08-08 DIAGNOSIS — I2699 Other pulmonary embolism without acute cor pulmonale: Secondary | ICD-10-CM | POA: Diagnosis not present

## 2017-08-08 DIAGNOSIS — Z7901 Long term (current) use of anticoagulants: Secondary | ICD-10-CM

## 2017-08-08 LAB — POCT INR: INR: 2.1

## 2017-08-08 MED ORDER — WARFARIN SODIUM 5 MG PO TABS: ORAL_TABLET | ORAL | 2 refills | Status: DC

## 2017-08-08 NOTE — Progress Notes (Signed)
Anticoagulation Management Richard BirksMicha Normand SloopDillard is a 41 y.o. male who reports to the clinic for monitoring of warfarin treatment.    Indication: PE , history of; long term use of anticoagulant.   Duration: indefinite Supervising physician: Blanch MediaElizabeth Butcher  Anticoagulation Clinic Visit History: Patient does not report signs/symptoms of bleeding or thromboembolism  Other recent changes: No diet, medications, lifestyle changes endorsed by the patient to me at this visit.  Anticoagulation Episode Summary    Current INR goal:   2.0-3.0  TTR:   54.8 % (5.3 y)  Next INR check:   09/05/2017  INR from last check:     Weekly max warfarin dose:     Target end date:   Indefinite  INR check location:   [facility]  Preferred lab:     Send INR reminders to:      Indications   Pulmonary embolism and infarction (HCC) [I26.99] D V T [I80.299] Long term (current) use of anticoagulants [Z79.01]       Comments:           No Known Allergies Prior to Admission medications   Medication Sig Start Date End Date Taking? Authorizing Provider  cetirizine (ZYRTEC) 10 MG tablet Take 1 tablet (10 mg total) by mouth daily. 08/19/15  Yes Denton Brickruong, Diana M, MD  warfarin (COUMADIN) 5 MG tablet Take 1&1/2 tablets daily by mouth at 6PM. 08/08/17  Yes Elicia LampGroce, Gianny Sabino B, RPH-CPP   Past Medical History:  Diagnosis Date  . DVT (deep venous thrombosis) (HCC)   . Seasonal allergies    Social History   Socioeconomic History  . Marital status: Single    Spouse name: Not on file  . Number of children: Not on file  . Years of education: Not on file  . Highest education level: Not on file  Social Needs  . Financial resource strain: Not on file  . Food insecurity - worry: Not on file  . Food insecurity - inability: Not on file  . Transportation needs - medical: Not on file  . Transportation needs - non-medical: Not on file  Occupational History  . Not on file  Tobacco Use  . Smoking status: Former Smoker   Years: 1.00    Types: Cigarettes, Cigars    Last attempt to quit: 04/05/2012    Years since quitting: 5.3  Substance and Sexual Activity  . Alcohol use: No    Alcohol/week: 0.0 oz  . Drug use: No  . Sexual activity: Not on file  Other Topics Concern  . Not on file  Social History Narrative  . Not on file   Family History  Problem Relation Age of Onset  . Other Mother   . Other Father   . Pulmonary embolism Maternal Uncle     ASSESSMENT Recent Results: The most recent result is correlated with 45 mg per week: Lab Results  Component Value Date   INR 2.10 08/08/2017   INR 1.70 07/04/2017   INR 2.70 06/06/2017   PROTIME 20.4 (H) 03/16/2011    Anticoagulation Dosing: Description   Patient instructed to take 1 & 1/2 tablets of your 5mg  peach-colored warfarin tablets every day, by mouth, at 6PM.       INR today: Therapeutic  PLAN Weekly dose was increased by 17% to 52.5 mg per week  Patient Instructions  Patient instructed to take medications as defined in the Anti-coagulation Track section of this encounter.  Patient instructed to take today's dose.  Patient instructed to take 1 &  1/2 tablets of your 5mg  peach-colored warfarin tablets every day, by mouth, at [facility]6PM.  Patient verbalized understanding of these instructions.     Patient advised to contact clinic or seek medical attention if signs/symptoms of bleeding or thromboembolism occur.  Patient verbalized understanding by repeating back information and was advised to contact me if further medication-related questions arise. Patient was also provided an information handout.  Follow-up Return in 4 weeks (on 09/05/2017) for Follow up INR at 4:30PM.  Elicia LampJames B Romona Murdy, PharmD, CACP, CPP  15 minutes spent face-to-face with the patient during the encounter. 50% of time spent on education. 50% of time was spent on fingerstick point of care INR sample collection. Processing, results determination, dose change, refill  authorization and documentation in [email or web address].

## 2017-08-08 NOTE — Patient Instructions (Signed)
Patient instructed to take medications as defined in the Anti-coagulation Track section of this encounter.  Patient instructed to take today's dose.  Patient instructed to take 1 & 1/2 tablets of your 5mg  peach-colored warfarin tablets every day, by mouth, at [facility]6PM.  Patient verbalized understanding of these instructions.

## 2017-09-05 ENCOUNTER — Ambulatory Visit (INDEPENDENT_AMBULATORY_CARE_PROVIDER_SITE_OTHER): Payer: BLUE CROSS/BLUE SHIELD | Admitting: Pharmacist

## 2017-09-05 DIAGNOSIS — Z5181 Encounter for therapeutic drug level monitoring: Secondary | ICD-10-CM

## 2017-09-05 DIAGNOSIS — Z7901 Long term (current) use of anticoagulants: Secondary | ICD-10-CM

## 2017-09-05 DIAGNOSIS — I2699 Other pulmonary embolism without acute cor pulmonale: Secondary | ICD-10-CM

## 2017-09-05 DIAGNOSIS — I80299 Phlebitis and thrombophlebitis of other deep vessels of unspecified lower extremity: Secondary | ICD-10-CM

## 2017-09-05 LAB — POCT INR: INR: 1.9

## 2017-09-05 NOTE — Progress Notes (Signed)
Anticoagulation Management Richard [name] is a 42 y.o. male who reports to the clinic for monitoring of warfarin treatment.    Indication: PE , history of; DVT, history of; long term use of anticoagulant.  Duration: indefinite Supervising physician: Carlynn PurlErik Hoffman  Anticoagulation Clinic Visit History: Patient does not report signs/symptoms of bleeding or thromboembolism  Other recent changes: No diet, medications, lifestyle Anticoagulation Episode Summary    Current INR goal:   2.0-3.0  TTR:   54.7 % (5.4 y)  Next INR check:   09/19/2017  INR from last check:   1.90! (09/05/2017)  Weekly max warfarin dose:     Target end date:   Indefinite  INR check location:   [facility]  Preferred lab:     Send INR reminders to:      Indications   Pulmonary embolism and infarction (HCC) [I26.99] D V T [I80.299] Long term (current) use of anticoagulants [Z79.01]       Comments:           No Known Allergies Prior to Admission medications   Medication Sig Start Date End Date Taking? Authorizing Provider  cetirizine (ZYRTEC) 10 MG tablet Take 1 tablet (10 mg total) by mouth daily. 08/19/15  Yes Denton Brickruong, Diana M, MD  warfarin (COUMADIN) 5 MG tablet Take 1&1/2 tablets daily by mouth at 6PM. 08/08/17  Yes Elicia LampGroce, Cameryn Schum B, RPH-CPP   Past Medical History:  Diagnosis Date  . DVT (deep venous thrombosis) (HCC)   . Seasonal allergies    Social History   Socioeconomic History  . Marital status: Single    Spouse name: Not on file  . Number of children: Not on file  . Years of education: Not on file  . Highest education level: Not on file  Social Needs  . Financial resource strain: Not on file  . Food insecurity - worry: Not on file  . Food insecurity - inability: Not on file  . Transportation needs - medical: Not on file  . Transportation needs - non-medical: Not on file  Occupational History  . Not on file  Tobacco Use  . Smoking status: Former Smoker    Years: 1.00    Types:  Cigarettes, Cigars    Last attempt to quit: 04/05/2012    Years since quitting: 5.4  Substance and Sexual Activity  . Alcohol use: No    Alcohol/week: 0.0 oz  . Drug use: No  . Sexual activity: Not on file  Other Topics Concern  . Not on file  Social History Narrative  . Not on file   Family History  Problem Relation Age of Onset  . Other Mother   . Other Father   . Pulmonary embolism Maternal Uncle     ASSESSMENT Recent Results: The most recent result is correlated with 52.5 mg per week: Lab Results  Component Value Date   INR 1.90 09/05/2017   INR 2.10 08/08/2017   INR 1.70 07/04/2017   PROTIME 20.4 (H) 03/16/2011    Anticoagulation Dosing: Description   Patient instructed to take 1 & 1/2 tablets of your 5mg  peach-colored warfarin tablets every day--EXCEPT on MONDAYS, WEDNESDAYS, and FRIDAYS--take two (2) tablets on these days.        INR today: Subtherapeutic  PLAN Weekly dose was increased by 12% to 60 mg per week  Patient Instructions  Patient instructed to take medications as defined in the Anti-coagulation Track section of this encounter.  Patient instructed to take today's dose.  Patient instructed to take  1 & 1/2 tablets of your 5mg  peach-colored warfarin tablets every day--EXCEPT on MONDAYS, WEDNESDAYS, and FRIDAYS--take two (2) tablets on these days.  Patient verbalized understanding of these instructions.     Patient advised to contact clinic or seek medical attention if signs/symptoms of bleeding or thromboembolism occur.  Patient verbalized understanding by repeating back information and was advised to contact me if further medication-related questions arise. Patient was also provided an information handout.  Follow-up Return in 4 weeks (on 10/03/2017) for Follow up INR at 4:30PM.  [name], PharmD, CACP, CPP  15 minutes spent face-to-face with the patient during the encounter. 50% of time spent on education. 50% of time was spent on  fingerstick point of care INR sample collection, processing, results determination, dose adjustment and documentation in [email or web address]

## 2017-09-05 NOTE — Patient Instructions (Signed)
Patient instructed to take medications as defined in the Anti-coagulation Track section of this encounter.  Patient instructed to take today's dose.  Patient instructed to take 1 & 1/2 tablets of your 5mg  peach-colored warfarin tablets every day--EXCEPT on MONDAYS, WEDNESDAYS, and FRIDAYS--take two (2) tablets on these days.  Patient verbalized understanding of these instructions.

## 2017-09-21 ENCOUNTER — Encounter: Payer: Self-pay | Admitting: *Deleted

## 2017-10-03 ENCOUNTER — Ambulatory Visit (INDEPENDENT_AMBULATORY_CARE_PROVIDER_SITE_OTHER): Payer: BLUE CROSS/BLUE SHIELD | Admitting: Pharmacist

## 2017-10-03 ENCOUNTER — Encounter (INDEPENDENT_AMBULATORY_CARE_PROVIDER_SITE_OTHER): Payer: Self-pay

## 2017-10-03 DIAGNOSIS — I2699 Other pulmonary embolism without acute cor pulmonale: Secondary | ICD-10-CM

## 2017-10-03 DIAGNOSIS — Z7901 Long term (current) use of anticoagulants: Secondary | ICD-10-CM | POA: Diagnosis not present

## 2017-10-03 LAB — POCT INR: INR: 2.8

## 2017-10-03 MED ORDER — WARFARIN SODIUM 5 MG PO TABS: ORAL_TABLET | ORAL | 2 refills | Status: DC

## 2017-10-03 NOTE — Progress Notes (Signed)
Anticoagulation Management Richard [name] is a 42 y.o. male who reports to the clinic for monitoring of warfarin treatment.    Indication: PE, history of; DVT, history of; Long term current use of anticoagulant.  Duration: indefinite Supervising physician: Blanch Media  Anticoagulation Clinic Visit History: Patient does not report signs/symptoms of bleeding or thromboembolism  Other recent changes: No diet, medications, lifestyle changes endorsed by patient at this visit.  Anticoagulation Episode Summary    Current INR goal:   2.0-3.0  TTR:   55.2 % (5.4 y)  Next INR check:   11/07/2017  INR from last check:   2.80 (10/03/2017)  Weekly max warfarin dose:     Target end date:   Indefinite  INR check location:   [facility]  Preferred lab:     Send INR reminders to:      Indications   Pulmonary embolism and infarction (HCC) [I26.99] D V T [I80.299] Long term (current) use of anticoagulants [Z79.01]       Comments:           No Known Allergies Prior to Admission medications   Medication Sig Start Date End Date Taking? Authorizing Provider  cetirizine (ZYRTEC) 10 MG tablet Take 1 tablet (10 mg total) by mouth daily. 08/19/15  Yes Denton Brick, MD  warfarin (COUMADIN) 5 MG tablet Take 1&1/2 tablets daily by mouth at 6PM. 10/03/17  Yes [name], RPH-CPP   Past Medical History:  Diagnosis Date  . DVT (deep venous thrombosis) (HCC)   . Seasonal allergies    Social History   Socioeconomic History  . Marital status: Single    Spouse name: Not on file  . Number of children: Not on file  . Years of education: Not on file  . Highest education level: Not on file  Social Needs  . Financial resource strain: Not on file  . Food insecurity - worry: Not on file  . Food insecurity - inability: Not on file  . Transportation needs - medical: Not on file  . Transportation needs - non-medical: Not on file  Occupational History  . Not on file  Tobacco Use  . Smoking  status: Former Smoker    Years: 1.00    Types: Cigarettes, Cigars    Last attempt to quit: 04/05/2012    Years since quitting: 5.4  Substance and Sexual Activity  . Alcohol use: No    Alcohol/week: 0.0 oz  . Drug use: No  . Sexual activity: Not on file  Other Topics Concern  . Not on file  Social History Narrative  . Not on file   Family History  Problem Relation Age of Onset  . Other Mother   . Other Father   . Pulmonary embolism Maternal Uncle     ASSESSMENT Recent Results: The most recent result is correlated with 60 mg per week: Lab Results  Component Value Date   INR 2.80 10/03/2017   INR 1.90 09/05/2017   INR 2.10 08/08/2017   PROTIME 20.4 (H) 03/16/2011    Anticoagulation Dosing: Description   Patient instructed to take 1 & 1/2 tablets of your 5mg  peach-colored warfarin tablets every day.        INR today: Therapeutic  PLAN Weekly dose was decreased by 12% to 52.5 mg per week  Patient Instructions  Patient instructed to take medications as defined in the Anti-coagulation Track section of this encounter.  Patient instructed to take today's dose. Patient instructed to take   1 &  1/2 tablets of your 5mg  peach-colored warfarin tablets every day.    Patient verbalized understanding of these instructions.     Patient advised to contact clinic or seek medical attention if signs/symptoms of bleeding or thromboembolism occur.  Patient verbalized understanding by repeating back information and was advised to contact me if further medication-related questions arise. Patient was also provided an information handout.  Follow-up Return in 5 weeks (on 11/07/2017) for Follow up INR at 4:30PM.  Elicia LampJames B Yuma Pacella, PharmD, CACP, CPP  15 minutes spent face-to-face with the patient during the encounter. 50% of time spent on education. 50% of time was spent on fingerstick point of care INR sample collection, processing, results determination, dose adjustment and documentation in  [email or web address].

## 2017-10-03 NOTE — Patient Instructions (Signed)
Patient instructed to take medications as defined in the Anti-coagulation Track section of this encounter.  Patient instructed to take today's dose. Patient instructed to take   1 & 1/2 tablets of your 5mg  peach-colored warfarin tablets every day.    Patient verbalized understanding of these instructions.

## 2017-11-07 ENCOUNTER — Ambulatory Visit (INDEPENDENT_AMBULATORY_CARE_PROVIDER_SITE_OTHER): Payer: BLUE CROSS/BLUE SHIELD | Admitting: Pharmacist

## 2017-11-07 DIAGNOSIS — Z86718 Personal history of other venous thrombosis and embolism: Secondary | ICD-10-CM

## 2017-11-07 DIAGNOSIS — Z86711 Personal history of pulmonary embolism: Secondary | ICD-10-CM

## 2017-11-07 DIAGNOSIS — I2699 Other pulmonary embolism without acute cor pulmonale: Secondary | ICD-10-CM

## 2017-11-07 DIAGNOSIS — Z5181 Encounter for therapeutic drug level monitoring: Secondary | ICD-10-CM | POA: Diagnosis not present

## 2017-11-07 DIAGNOSIS — Z7901 Long term (current) use of anticoagulants: Secondary | ICD-10-CM | POA: Diagnosis not present

## 2017-11-07 LAB — POCT INR: INR: 2

## 2017-11-07 NOTE — Progress Notes (Signed)
Anticoagulation Management Richard GandyMicha [name] is a 42 y.o. male who reports to the clinic for monitoring of warfarin treatment.    Indication: PE, history of; DVT, history of; Long term (current) use of anticoagulants.  Duration: indefinite Supervising physician: Earl LagosNischal Narendra  Anticoagulation Clinic Visit History: Patient does not report signs/symptoms of bleeding or thromboembolism  Other recent changes: No diet, medications, lifestyle changes endorsed by the patient at this visit.  Anticoagulation Episode Summary    Current INR goal:   2.0-3.0  TTR:   56.0 % (5.5 y)  Next INR check:   12/05/2017  INR from last check:   2.00 (11/07/2017)  Weekly max warfarin dose:     Target end date:   Indefinite  INR check location:   [facility]  Preferred lab:     Send INR reminders to:      Indications   Pulmonary embolism and infarction (HCC) [I26.99] D V T [I80.299] Long term (current) use of anticoagulants [Z79.01]       Comments:           No Known Allergies Prior to Admission medications   Medication Sig Start Date End Date Taking? Authorizing Provider  cetirizine (ZYRTEC) 10 MG tablet Take 1 tablet (10 mg total) by mouth daily. 08/19/15  Yes Denton Brickruong, Diana M, MD  warfarin (COUMADIN) 5 MG tablet Take 1&1/2 tablets daily by mouth at 6PM. 10/03/17  Yes Elicia LampGroce, James B, RPH-CPP   Past Medical History:  Diagnosis Date  . DVT (deep venous thrombosis) (HCC)   . Seasonal allergies    Social History   Socioeconomic History  . Marital status: Single    Spouse name: Not on file  . Number of children: Not on file  . Years of education: Not on file  . Highest education level: Not on file  Social Needs  . Financial resource strain: Not on file  . Food insecurity - worry: Not on file  . Food insecurity - inability: Not on file  . Transportation needs - medical: Not on file  . Transportation needs - non-medical: Not on file  Occupational History  . Not on file  Tobacco Use  .  Smoking status: Former Smoker    Years: 1.00    Types: Cigarettes, Cigars    Last attempt to quit: 04/05/2012    Years since quitting: 5.5  Substance and Sexual Activity  . Alcohol use: No    Alcohol/week: 0.0 oz  . Drug use: No  . Sexual activity: Not on file  Other Topics Concern  . Not on file  Social History Narrative  . Not on file   Family History  Problem Relation Age of Onset  . Other Mother   . Other Father   . Pulmonary embolism Maternal Uncle     ASSESSMENT Recent Results: The most recent result is correlated with 52.5 mg per week: Lab Results  Component Value Date   INR 2.00 11/07/2017   INR 2.80 10/03/2017   INR 1.90 09/05/2017   PROTIME 20.4 (H) 03/16/2011    Anticoagulation Dosing: Description   Take 1 & 1/2 tablets of your 5mg  peach-colored warfarin tablets every day--[facility] on Wednesdays--take 2 of your 5mg  peach-colored warfarin tablets.         INR today: Therapeutic  PLAN Weekly dose was increased by 5% to 55 mg per week  Patient Instructions  Patient instructed to take medications as defined in the Anti-coagulation Track section of this encounter.  Patient instructed to take  today's dose.  Patient instructed to take 1 & 1/2 tablets of your 5mg  peach-colored warfarin tablets every day--EXCEPT on [facility], on Wednesdays--take 2 of your 5mg  peach-colored warfarin tablets. Patient verbalized understanding of these instructions.     Patient advised to contact clinic or seek medical attention if signs/symptoms of bleeding or thromboembolism occur.  Patient verbalized understanding by repeating back information and was advised to contact me if further medication-related questions arise. Patient was also provided an information handout.  Follow-up Return in 4 weeks (on 12/05/2017) for Follow up INR at 4:30PM.  [name], PharmD, CACP, CPP  15 minutes spent face-to-face with the patient during the encounter. 50% of time spent  on education. 50% of time was spent on fingerstick point of care sample collection, processing, results determination, dose adjustment and documentation in [email or web address]

## 2017-11-07 NOTE — Progress Notes (Signed)
INTERNAL MEDICINE TEACHING ATTENDING ADDENDUM - Micky Overturf M.D  Duration- indefinite, Indication- recurrent PE, INR- therapeutic. Agree with pharmacy recommendations as outlined in their note.      

## 2017-11-07 NOTE — Patient Instructions (Signed)
Patient instructed to take medications as defined in the Anti-coagulation Track section of this encounter.  Patient instructed to take today's dose.  Patient instructed to take 1 & 1/2 tablets of your 5mg  peach-colored warfarin tablets every day--EXCEPT [facility], on Wednesdays--take 2 of your 5mg  peach-colored warfarin tablets. Patient verbalized understanding of these instructions.

## 2017-12-05 ENCOUNTER — Ambulatory Visit (INDEPENDENT_AMBULATORY_CARE_PROVIDER_SITE_OTHER): Payer: BLUE CROSS/BLUE SHIELD | Admitting: Pharmacist

## 2017-12-05 DIAGNOSIS — Z7901 Long term (current) use of anticoagulants: Secondary | ICD-10-CM | POA: Diagnosis not present

## 2017-12-05 DIAGNOSIS — Z5181 Encounter for therapeutic drug level monitoring: Secondary | ICD-10-CM

## 2017-12-05 DIAGNOSIS — I80299 Phlebitis and thrombophlebitis of other deep vessels of unspecified lower extremity: Secondary | ICD-10-CM | POA: Diagnosis not present

## 2017-12-05 DIAGNOSIS — I2699 Other pulmonary embolism without acute cor pulmonale: Secondary | ICD-10-CM | POA: Diagnosis not present

## 2017-12-05 LAB — POCT INR: INR: 2.9

## 2017-12-05 NOTE — Progress Notes (Signed)
Anticoagulation Management Richard [name] is a 42 y.o. male who reports to the clinic for monitoring of warfarin treatment.    Indication: PE, history of; DVT, history of; Long term use of anticoagulant.  Duration: indefinite Supervising physician: Carlynn Purl  Anticoagulation Clinic Visit History: Patient does not report signs/symptoms of bleeding or thromboembolism  Other recent changes: No diet, medications, lifestyle changes endorsed.  Anticoagulation Episode Summary    Current INR goal:   2.0-3.0  TTR:   56.6 % (5.6 y)  Next INR check:   01/02/2018  INR from last check:   2.90 (12/05/2017)  Weekly max warfarin dose:     Target end date:   Indefinite  INR check location:   [facility]  Preferred lab:     Send INR reminders to:      Indications   Pulmonary embolism and infarction (HCC) [I26.99] D V T [I80.299] Long term (current) use of anticoagulants [Z79.01]       Comments:           No Known Allergies Prior to Admission medications   Medication Sig Start Date End Date Taking? Authorizing Provider  cetirizine (ZYRTEC) 10 MG tablet Take 1 tablet (10 mg total) by mouth daily. 08/19/15  Yes Denton Brick, MD  warfarin (COUMADIN) 5 MG tablet Take 1&1/2 tablets daily by mouth at 6PM. 10/03/17  Yes [name], RPH-CPP   Past Medical History:  Diagnosis Date  . DVT (deep venous thrombosis) (HCC)   . Seasonal allergies    Social History   Socioeconomic History  . Marital status: Single    Spouse name: Not on file  . Number of children: Not on file  . Years of education: Not on file  . Highest education level: Not on file  Occupational History  . Not on file  Social Needs  . Financial resource strain: Not on file  . Food insecurity:    Worry: Not on file    Inability: Not on file  . Transportation needs:    Medical: Not on file    Non-medical: Not on file  Tobacco Use  . Smoking status: Former Smoker    Years: 1.00    Types: Cigarettes, Cigars     Last attempt to quit: 04/05/2012    Years since quitting: 5.6  Substance and Sexual Activity  . Alcohol use: No    Alcohol/week: 0.0 oz  . Drug use: No  . Sexual activity: Not on file  Lifestyle  . Physical activity:    Days per week: Not on file    Minutes per session: Not on file  . Stress: Not on file  Relationships  . Social connections:    Talks on phone: Not on file    Gets together: Not on file    Attends religious service: Not on file    Active member of club or organization: Not on file    Attends meetings of clubs or organizations: Not on file    Relationship status: Not on file  Other Topics Concern  . Not on file  Social History Narrative  . Not on file   Family History  Problem Relation Age of Onset  . Other Mother   . Other Father   . Pulmonary embolism Maternal Uncle     ASSESSMENT Recent Results: The most recent result is correlated with 55 mg per week: Lab Results  Component Value Date   INR 2.90 12/05/2017   INR 2.00 11/07/2017   INR 2.80  10/03/2017   PROTIME 20.4 (H) 03/16/2011    Anticoagulation Dosing: Description   Take 1 & 1/2 tablets of your 5mg  peach-colored warfarin tablets every day.        INR today: Therapeutic  PLAN Weekly dose was decreased by 5% to 52.5 mg per week  Patient Instructions  Patient instructed to take medications as defined in the Anti-coagulation Track section of this encounter.  Patient instructed to take today's dose.  Patient instructed to take 1 & 1/2 tablets of your 5mg  peach-colored warfarin tablets every day.  Patient verbalized understanding of these instructions.     Patient advised to contact clinic or seek medical attention if signs/symptoms of bleeding or thromboembolism occur.  Patient verbalized understanding by repeating back information and was advised to contact me if further medication-related questions arise. Patient was also provided an information handout.  Follow-up Return in about  1 month (around 01/02/2018) for Follow up INR at 4PM.  Elicia LampJames B Arvada Seaborn, PharmD, CACP, CPP  15 minutes spent face-to-face with the patient during the encounter. 50% of time spent on education. 50% of time was spent on fingerstick point of care INR sample collection, processing, results determination, dose adjustment and documentation in [email or web address]

## 2017-12-05 NOTE — Patient Instructions (Signed)
Patient instructed to take medications as defined in the Anti-coagulation Track section of this encounter.  Patient instructed to take today's dose.  Patient instructed to take 1 & 1/2 tablets of your 5mg  peach-colored warfarin tablets every day.  Patient verbalized understanding of these instructions.

## 2017-12-06 NOTE — Progress Notes (Signed)
INTERNAL MEDICINE TEACHING ATTENDING ADDENDUM - Gust RungHoffman, Erik C, DO Duration- indefinate, Indication- recurrent VTE, INR-  Lab Results  Component Value Date   INR 2.90 12/05/2017   PROTIME 20.4 (H) 03/16/2011  . Agree with pharmacy recommendations as outlined in their note.

## 2017-12-07 DIAGNOSIS — K05319 Chronic periodontitis, localized, unspecified severity: Secondary | ICD-10-CM | POA: Diagnosis not present

## 2017-12-07 DIAGNOSIS — K047 Periapical abscess without sinus: Secondary | ICD-10-CM | POA: Diagnosis not present

## 2018-01-02 ENCOUNTER — Encounter (INDEPENDENT_AMBULATORY_CARE_PROVIDER_SITE_OTHER): Payer: Self-pay

## 2018-01-02 ENCOUNTER — Ambulatory Visit (INDEPENDENT_AMBULATORY_CARE_PROVIDER_SITE_OTHER): Payer: BLUE CROSS/BLUE SHIELD | Admitting: Pharmacist

## 2018-01-02 DIAGNOSIS — I2699 Other pulmonary embolism without acute cor pulmonale: Secondary | ICD-10-CM | POA: Diagnosis not present

## 2018-01-02 DIAGNOSIS — Z7901 Long term (current) use of anticoagulants: Secondary | ICD-10-CM

## 2018-01-02 DIAGNOSIS — I80299 Phlebitis and thrombophlebitis of other deep vessels of unspecified lower extremity: Secondary | ICD-10-CM

## 2018-01-02 DIAGNOSIS — Z5181 Encounter for therapeutic drug level monitoring: Secondary | ICD-10-CM | POA: Diagnosis not present

## 2018-01-02 LAB — POCT INR: INR: 5.1

## 2018-01-02 NOTE — Patient Instructions (Signed)
Patient instructed to take medications as defined in the Anti-coagulation Track section of this encounter.  Patient instructed to OMIT/HOLD today's dose.  Patient instructed to re-commence warfarin on Tuesday 14-MAY-19 taking one (1) tablet of your  peach-colored warfarin tablets on Tuesdays and Thursdays; all other days, take one and one-half (1 & 1/2 ) tablets. Patient instructed to drink 1-2 bottles of "Lipton's Green Tea" tonight.  Patient verbalized understanding of these instructions.

## 2018-01-02 NOTE — Progress Notes (Signed)
Anticoagulation Management Richard [name] is a 42 y.o. male who reports to the clinic for monitoring of warfarin treatment.    Indication: PE, history of; long term current use of anticoagulants.   Duration: indefinite Supervising physician: Jessy Oto, MD  Anticoagulation Clinic Visit History: Patient does not report signs/symptoms of bleeding or thromboembolism  Other recent changes: No diet, medications, lifestyle changes other than as noted in patient findings.  Anticoagulation Episode Summary    Current INR goal:   2.0-3.0  TTR:   55.9 % (5.7 y)  Next INR check:   01/23/2018  INR from last check:   5.10! (01/02/2018)  Weekly max warfarin dose:     Target end date:   Indefinite  INR check location:   [facility]  Preferred lab:     Send INR reminders to:      Indications   Pulmonary embolism and infarction (HCC) [I26.99] D V T [I80.299] Long term (current) use of anticoagulants [Z79.01]       Comments:           No Known Allergies Prior to Admission medications   Medication Sig Start Date End Date Taking? Authorizing Provider  cetirizine (ZYRTEC) 10 MG tablet Take 1 tablet (10 mg total) by mouth daily. 08/19/15  Yes Denton Brick, MD  warfarin (COUMADIN) 5 MG tablet Take 1&1/2 tablets daily by mouth at 6PM. 10/03/17  Yes [name], RPH-CPP   Past Medical History:  Diagnosis Date  . DVT (deep venous thrombosis) (HCC)   . Seasonal allergies    Social History   Socioeconomic History  . Marital status: Single    Spouse name: Not on file  . Number of children: Not on file  . Years of education: Not on file  . Highest education level: Not on file  Occupational History  . Not on file  Social Needs  . Financial resource strain: Not on file  . Food insecurity:    Worry: Not on file    Inability: Not on file  . Transportation needs:    Medical: Not on file    Non-medical: Not on file  Tobacco Use  . Smoking status: Former Smoker    Years:  1.00    Types: Cigarettes, Cigars    Last attempt to quit: 04/05/2012    Years since quitting: 5.7  Substance and Sexual Activity  . Alcohol use: No    Alcohol/week: 0.0 oz  . Drug use: No  . Sexual activity: Not on file  Lifestyle  . Physical activity:    Days per week: Not on file    Minutes per session: Not on file  . Stress: Not on file  Relationships  . Social connections:    Talks on phone: Not on file    Gets together: Not on file    Attends religious service: Not on file    Active member of club or organization: Not on file    Attends meetings of clubs or organizations: Not on file    Relationship status: Not on file  Other Topics Concern  . Not on file  Social History Narrative  . Not on file   Family History  Problem Relation Age of Onset  . Other Mother   . Other Father   . Pulmonary embolism Maternal Uncle     ASSESSMENT Recent Results: The most recent result is correlated with 52.5 mg per week: Lab Results  Component Value Date   INR 5.10 01/02/2018   INR  2.90 12/05/2017   INR 2.00 11/07/2017   PROTIME 20.4 (H) 03/16/2011    Anticoagulation Dosing: Description   OMIT/HOLD today's dose of warfarin. Re-commence warfarin on Tuesday. Take one (1) of your peach-colored warfarin tablets on Tuesdays and Thursdays; all other days, take one and one-half (1 & 1/2 tablets).         INR today: Supratherapeutic  PLAN Weekly dose was decreased by 10% to 47.5 mg per week  Patient Instructions  Patient instructed to take medications as defined in the Anti-coagulation Track section of this encounter.  Patient instructed to OMIT/HOLD today's dose.  Patient instructed to re-commence warfarin on Tuesday 14-MAY-19 taking one (1) tablet of your  peach-colored warfarin tablets on Tuesdays and Thursdays; all other days, take one and one-half (1 & 1/2 ) tablets. Patient instructed to drink 1-2 bottles of "Lipton's Green Tea" tonight.  Patient verbalized understanding  of these instructions.     Patient advised to contact clinic or seek medical attention if signs/symptoms of bleeding or thromboembolism occur.  Patient verbalized understanding by repeating back information and was advised to contact me if further medication-related questions arise. Patient was also provided an information handout.  Follow-up Return in about 3 weeks (around 01/23/2018) for Follow up INR at 4:30PM.  [name]  15 minutes spent face-to-face with the patient during the encounter. 50% of time spent on education. 50% of time was spent on fingerstick point of care INR sample collection, processing, results determination, dose adjustment and documentation in [email or web address]

## 2018-01-04 NOTE — Progress Notes (Signed)
INTERNAL MEDICINE TEACHING ATTENDING ADDENDUM  I agree with pharmacy recommendations as outlined in their note.   Alexander N Raines, MD  

## 2018-01-10 ENCOUNTER — Other Ambulatory Visit: Payer: Self-pay | Admitting: Pharmacist

## 2018-01-10 DIAGNOSIS — Z7901 Long term (current) use of anticoagulants: Secondary | ICD-10-CM

## 2018-01-10 DIAGNOSIS — I2699 Other pulmonary embolism without acute cor pulmonale: Secondary | ICD-10-CM

## 2018-01-23 ENCOUNTER — Ambulatory Visit (INDEPENDENT_AMBULATORY_CARE_PROVIDER_SITE_OTHER): Payer: BLUE CROSS/BLUE SHIELD | Admitting: Pharmacist

## 2018-01-23 DIAGNOSIS — Z7901 Long term (current) use of anticoagulants: Secondary | ICD-10-CM | POA: Diagnosis not present

## 2018-01-23 DIAGNOSIS — Z86718 Personal history of other venous thrombosis and embolism: Secondary | ICD-10-CM | POA: Diagnosis not present

## 2018-01-23 DIAGNOSIS — I2699 Other pulmonary embolism without acute cor pulmonale: Secondary | ICD-10-CM | POA: Diagnosis not present

## 2018-01-23 LAB — POCT INR: INR: 3.6 — AB (ref 2.0–3.0)

## 2018-01-23 MED ORDER — WARFARIN SODIUM 5 MG PO TABS: ORAL_TABLET | ORAL | 2 refills | Status: DC

## 2018-01-23 NOTE — Patient Instructions (Signed)
Patient instructed to take medications as defined in the Anti-coagulation Track section of this encounter.  Patient instructed to take today's dose.  Patient instructed to take  one (1) of your 5mg  peach-colored warfarin tablets on Mondays, Tuesdays, Thursdays and Fridays. On Wednesdays, Saturdays and Sundays, take 1 and 1/2 tablets of your 5mg  peach-colored warfarin tablets.  Patient verbalized understanding of these instructions.

## 2018-01-23 NOTE — Progress Notes (Signed)
Anticoagulation Management Richard GandyMicha [name] is a 42 y.o. male who reports to the clinic for monitoring of warfarin treatment.    Indication: PE with infarction, history of; DVT, history of; Long term current use of anticoagulants.   Duration: indefinite Supervising physician: Blanch MediaElizabeth Butcher  Anticoagulation Clinic Visit History: Patient does not report signs/symptoms of bleeding or thromboembolism  Other recent changes: No diet, medications, lifestyle changes endorsed by the patient to me at this visit.  Anticoagulation Episode Summary    Current INR goal:   2.0-3.0  TTR:   55.3 % (5.7 y)  Next INR check:   01/23/2018  INR from last check:   3.6! (01/23/2018)  Weekly max warfarin dose:     Target end date:   Indefinite  INR check location:   Anticoagulation Clinic  Preferred lab:     Send INR reminders to:      Indications   Pulmonary embolism and infarction (HCC) [I26.99] D V T [I80.299] Long term (current) use of anticoagulants [Z79.01]       Comments:           No Known Allergies Prior to Admission medications   Medication Sig Start Date End Date Taking? Authorizing Provider  cetirizine (ZYRTEC) 10 MG tablet Take 1 tablet (10 mg total) by mouth daily. 08/19/15  Yes Denton Brickruong, Diana M, MD  warfarin (COUMADIN) 5 MG tablet Take one (1) tablet by mouth, once-daily at 6PM, EXCEPT on Wednesdays, Saturdays and Sundays--take 1&1/2 tablets on these days. 01/23/18  Yes Elicia LampGroce, James B, RPH-CPP   Past Medical History:  Diagnosis Date  . DVT (deep venous thrombosis) (HCC)   . Seasonal allergies    Social History   Socioeconomic History  . Marital status: Single    Spouse name: Not on file  . Number of children: Not on file  . Years of education: Not on file  . Highest education level: Not on file  Occupational History  . Not on file  Social Needs  . Financial resource strain: Not on file  . Food insecurity:    Worry: Not on file    Inability: Not on file  . Transportation  needs:    Medical: Not on file    Non-medical: Not on file  Tobacco Use  . Smoking status: Former Smoker    Years: 1.00    Types: Cigarettes, Cigars    Last attempt to quit: 04/05/2012    Years since quitting: 5.8  Substance and Sexual Activity  . Alcohol use: No    Alcohol/week: 0.0 oz  . Drug use: No  . Sexual activity: Not on file  Lifestyle  . Physical activity:    Days per week: Not on file    Minutes per session: Not on file  . Stress: Not on file  Relationships  . Social connections:    Talks on phone: Not on file    Gets together: Not on file    Attends religious service: Not on file    Active member of club or organization: Not on file    Attends meetings of clubs or organizations: Not on file    Relationship status: Not on file  Other Topics Concern  . Not on file  Social History Narrative  . Not on file   Family History  Problem Relation Age of Onset  . Other Mother   . Other Father   . Pulmonary embolism Maternal Uncle     ASSESSMENT Recent Results: The most recent result is correlated with  47.5 mg per week: Lab Results  Component Value Date   INR 3.6 (A) 01/23/2018   INR 5.10 01/02/2018   INR 2.90 12/05/2017   PROTIME 20.4 (H) 03/16/2011    Anticoagulation Dosing: Description   Take one (1) of your 5mg  peach-colored warfarin tablets on Mondays, Tuesdays, Thursdays and Fridays. On Wednesdays, Saturdays and Sundays, take 1 and 1/2 tablets of your 5mg  peach-colored warfarin tablets.      INR today: Supratherapeutic  PLAN Weekly dose was decreased by 11% to 42.5 mg per week  Patient Instructions  Patient instructed to take medications as defined in the Anti-coagulation Track section of this encounter.  Patient instructed to take today's dose.  Patient instructed to take  one (1) of your 5mg  peach-colored warfarin tablets on Mondays, Tuesdays, Thursdays and Fridays. On Wednesdays, Saturdays and Sundays, take 1 and 1/2 tablets of your 5mg   peach-colored warfarin tablets.  Patient verbalized understanding of these instructions.      Patient advised to contact clinic or seek medical attention if signs/symptoms of bleeding or thromboembolism occur.  Patient verbalized understanding by repeating back information and was advised to contact me if further medication-related questions arise. Patient was also provided an information handout.  Follow-up Return in 3 weeks (on 02/13/2018) for Follow up INR at 4:30PM.  [name], PharmD, CACP, CPP  15 minutes spent face-to-face with the patient during the encounter. 50% of time spent on education. 50% of time was spent on fingerstick point of care INR sample collection, processing, results determination, dose adjustment and documentation in [email or web address]

## 2018-02-13 ENCOUNTER — Ambulatory Visit (INDEPENDENT_AMBULATORY_CARE_PROVIDER_SITE_OTHER): Payer: BLUE CROSS/BLUE SHIELD | Admitting: Pharmacist

## 2018-02-13 DIAGNOSIS — Z7901 Long term (current) use of anticoagulants: Secondary | ICD-10-CM | POA: Diagnosis not present

## 2018-02-13 DIAGNOSIS — I2699 Other pulmonary embolism without acute cor pulmonale: Secondary | ICD-10-CM

## 2018-02-13 DIAGNOSIS — Z5181 Encounter for therapeutic drug level monitoring: Secondary | ICD-10-CM | POA: Diagnosis not present

## 2018-02-13 DIAGNOSIS — Z86718 Personal history of other venous thrombosis and embolism: Secondary | ICD-10-CM | POA: Diagnosis not present

## 2018-02-13 DIAGNOSIS — Z86711 Personal history of pulmonary embolism: Secondary | ICD-10-CM

## 2018-02-13 LAB — POCT INR: INR: 3.3 — AB (ref 2.0–3.0)

## 2018-02-13 NOTE — Patient Instructions (Signed)
Patient instructed to take medications as defined in the Anti-coagulation Track section of this encounter.  Patient instructed to take today's dose.  Patient instructed to take one (1) of your 5mg  peach-colored warfarin tablets on Mondays, Tuesdays, Thursdays, Fridays and Saturdays. On Wednesdays and Sundays, take 1 and 1/2 tablets of your 5mg  peach-colored warfarin tablets.  Patient verbalized understanding of these instructions.

## 2018-02-13 NOTE — Progress Notes (Signed)
Anticoagulation Management Richard [name] is a 42 y.o. male who reports to the clinic for monitoring of warfarin treatment.    Indication: PE, history of; long term current use of anticoagulant. Duration: indefinite Supervising physician: Debe Coder  Anticoagulation Clinic Visit History: Patient does not report signs/symptoms of bleeding or thromboembolism  Other recent changes: No diet, medications, lifestyle changes endorsed.  Anticoagulation Episode Summary    Current INR goal:   2.0-3.0  TTR:   54.8 % (5.8 y)  Next INR check:   03/13/2018  INR from last check:   3.3! (02/13/2018)  Weekly max warfarin dose:     Target end date:   Indefinite  INR check location:   Anticoagulation Clinic  Preferred lab:     Send INR reminders to:      Indications   Pulmonary embolism and infarction (HCC) [I26.99] D V T [I80.299] Long term (current) use of anticoagulants [Z79.01]       Comments:           No Known Allergies Prior to Admission medications   Medication Sig Start Date End Date Taking? Authorizing Provider  cetirizine (ZYRTEC) 10 MG tablet Take 1 tablet (10 mg total) by mouth daily. 08/19/15  Yes Denton Brick, MD  warfarin (COUMADIN) 5 MG tablet Take one (1) tablet by mouth, once-daily at 6PM, EXCEPT on Wednesdays, Saturdays and Sundays--take 1&1/2 tablets on these days. 01/23/18  Yes [name], RPH-CPP   Past Medical History:  Diagnosis Date  . DVT (deep venous thrombosis) (HCC)   . Seasonal allergies    Social History   Socioeconomic History  . Marital status: Single    Spouse name: Not on file  . Number of children: Not on file  . Years of education: Not on file  . Highest education level: Not on file  Occupational History  . Not on file  Social Needs  . Financial resource strain: Not on file  . Food insecurity:    Worry: Not on file    Inability: Not on file  . Transportation needs:    Medical: Not on file    Non-medical: Not on file  Tobacco Use  .  Smoking status: Former Smoker    Years: 1.00    Types: Cigarettes, Cigars    Last attempt to quit: 04/05/2012    Years since quitting: 5.8  Substance and Sexual Activity  . Alcohol use: No    Alcohol/week: 0.0 oz  . Drug use: No  . Sexual activity: Not on file  Lifestyle  . Physical activity:    Days per week: Not on file    Minutes per session: Not on file  . Stress: Not on file  Relationships  . Social connections:    Talks on phone: Not on file    Gets together: Not on file    Attends religious service: Not on file    Active member of club or organization: Not on file    Attends meetings of clubs or organizations: Not on file    Relationship status: Not on file  Other Topics Concern  . Not on file  Social History Narrative  . Not on file   Family History  Problem Relation Age of Onset  . Other Mother   . Other Father   . Pulmonary embolism Maternal Uncle     ASSESSMENT Recent Results: The most recent result is correlated with 42.5 mg per week: Lab Results  Component Value Date   INR 3.3 (A)  02/13/2018   INR 3.6 (A) 01/23/2018   INR 5.10 01/02/2018   PROTIME 20.4 (H) 03/16/2011    Anticoagulation Dosing: Description   Take one (1) of your 5mg  peach-colored warfarin tablets on Mondays, Tuesdays, Thursdays, Fridays and Saturdays. On Wednesdays and Sundays, take 1 and 1/2 tablets of your 5mg  peach-colored warfarin tablets.      INR today: Supratherapeutic  PLAN Weekly dose was decreased by 6% to 40 mg per week  Patient Instructions  Patient instructed to take medications as defined in the Anti-coagulation Track section of this encounter.  Patient instructed to take today's dose.  Patient instructed to take one (1) of your 5mg  peach-colored warfarin tablets on Mondays, Tuesdays, Thursdays, Fridays and Saturdays. On Wednesdays and Sundays, take 1 and 1/2 tablets of your 5mg  peach-colored warfarin tablets.  Patient verbalized understanding of these instructions.      Patient advised to contact clinic or seek medical attention if signs/symptoms of bleeding or thromboembolism occur.  Patient verbalized understanding by repeating back information and was advised to contact me if further medication-related questions arise. Patient was also provided an information handout.  Follow-up Return in 1 month (on 03/13/2018) for Follow up INR at 4:30PM.  Elicia LampJames B Niaomi Cartaya, PharmD, CACP, CPP  15 minutes spent face-to-face with the patient during the encounter. 50% of time spent on education. 50% of time was spent on fingerstick point of care INR sample collection, processing, results determination, dose adjustment and documentation in [email or web address].

## 2018-02-16 NOTE — Progress Notes (Signed)
I reviewed Dr. Saralyn PilarGroce's note.  [facility], [facility].  Her INR was mildly elevated and dose was decreased.

## 2018-03-13 ENCOUNTER — Ambulatory Visit (INDEPENDENT_AMBULATORY_CARE_PROVIDER_SITE_OTHER): Payer: BLUE CROSS/BLUE SHIELD | Admitting: Pharmacist

## 2018-03-13 ENCOUNTER — Telehealth: Payer: Self-pay | Admitting: *Deleted

## 2018-03-13 DIAGNOSIS — I2699 Other pulmonary embolism without acute cor pulmonale: Secondary | ICD-10-CM

## 2018-03-13 DIAGNOSIS — I80299 Phlebitis and thrombophlebitis of other deep vessels of unspecified lower extremity: Secondary | ICD-10-CM

## 2018-03-13 DIAGNOSIS — Z5181 Encounter for therapeutic drug level monitoring: Secondary | ICD-10-CM

## 2018-03-13 DIAGNOSIS — Z7901 Long term (current) use of anticoagulants: Secondary | ICD-10-CM | POA: Diagnosis not present

## 2018-03-13 LAB — POCT INR: INR: 3.1 — AB (ref 2.0–3.0)

## 2018-03-13 NOTE — Telephone Encounter (Signed)
Will hold on a refill for now. Patient will need to call if an outbreak occurs for a refill.   Thank you

## 2018-03-13 NOTE — Progress Notes (Signed)
Anticoagulation Management Richard [name] is a 42 y.o. male who reports to the clinic for monitoring of warfarin treatment.    Indication: PE, history of; long term current use of anticoagulant.  Duration: indefinite Supervising physician: Debe Coder  Anticoagulation Clinic Visit History: Patient does not report signs/symptoms of bleeding or thromboembolism  Other recent changes: No diet, medications, lifestyle changes endorsed by the patient to me.  Anticoagulation Episode Summary    Current INR goal:   2.0-3.0  TTR:   54.0 % (5.9 y)  Next INR check:   04/17/2018  INR from last check:   3.1! (03/13/2018)  Weekly max warfarin dose:     Target end date:   Indefinite  INR check location:   Anticoagulation Clinic  Preferred lab:     Send INR reminders to:      Indications   Pulmonary embolism and infarction (HCC) [I26.99] D V T [I80.299] Long term (current) use of anticoagulants [Z79.01]       Comments:           No Known Allergies Prior to Admission medications   Medication Sig Start Date End Date Taking? Authorizing Provider  cetirizine (ZYRTEC) 10 MG tablet Take 1 tablet (10 mg total) by mouth daily. 08/19/15  Yes Denton Brick, MD  warfarin (COUMADIN) 5 MG tablet Take one (1) tablet by mouth, once-daily at 6PM, EXCEPT on Wednesdays, Saturdays and Sundays--take 1&1/2 tablets on these days. 01/23/18  Yes [name], RPH-CPP   Past Medical History:  Diagnosis Date  . DVT (deep venous thrombosis) (HCC)   . Seasonal allergies    Social History   Socioeconomic History  . Marital status: Single    Spouse name: Not on file  . Number of children: Not on file  . Years of education: Not on file  . Highest education level: Not on file  Occupational History  . Not on file  Social Needs  . Financial resource strain: Not on file  . Food insecurity:    Worry: Not on file    Inability: Not on file  . Transportation needs:    Medical: Not on file    Non-medical: Not on  file  Tobacco Use  . Smoking status: Former Smoker    Years: 1.00    Types: Cigarettes, Cigars    Last attempt to quit: 04/05/2012    Years since quitting: 5.9  Substance and Sexual Activity  . Alcohol use: No    Alcohol/week: 0.0 oz  . Drug use: No  . Sexual activity: Not on file  Lifestyle  . Physical activity:    Days per week: Not on file    Minutes per session: Not on file  . Stress: Not on file  Relationships  . Social connections:    Talks on phone: Not on file    Gets together: Not on file    Attends religious service: Not on file    Active member of club or organization: Not on file    Attends meetings of clubs or organizations: Not on file    Relationship status: Not on file  Other Topics Concern  . Not on file  Social History Narrative  . Not on file   Family History  Problem Relation Age of Onset  . Other Mother   . Other Father   . Pulmonary embolism Maternal Uncle     ASSESSMENT Recent Results: The most recent result is correlated with 40 mg per week: Lab Results  Component Value  Date   INR 3.1 (A) 03/13/2018   INR 3.3 (A) 02/13/2018   INR 3.6 (A) 01/23/2018   PROTIME 20.4 (H) 03/16/2011    Anticoagulation Dosing: Description   Take one (1) of your 5mg  peach-colored warfarin tablets on Mondays, Tuesdays, Wednesdays, Thursdays, Fridays and Saturdays. On Wednesdays, take 1 and 1/2 tablets of your 5mg  peach-colored warfarin tablets.      INR today: Supratherapeutic  PLAN Weekly dose was decreased by 6.7% to 37.5 mg per week  Patient Instructions  Patient instructed to take medications as defined in the Anti-coagulation Track section of this encounter.  Patient instructed to take today's dose.  Patient instructed to take  one (1) of your 5mg  peach-colored warfarin tablets on Mondays, Tuesdays, Wednesdays, Thursdays, Fridays and Saturdays. On Wednesdays, take 1 and 1/2 tablets of your 5mg  peach-colored warfarin tablets.  Patient verbalized  understanding of these instructions.     Patient advised to contact clinic or seek medical attention if signs/symptoms of bleeding or thromboembolism occur.  Patient verbalized understanding by repeating back information and was advised to contact me if further medication-related questions arise. Patient was also provided an information handout.  Follow-up Return in 5 weeks (on 04/17/2018) for Follow up INR at 4:30PM.  Elicia LampJames B Groce, PharmD, CACP, CPP  15 minutes spent face-to-face with the patient during the encounter. 50% of time spent on education. 50% of time was spent on fingerstick point of care INR sample collection, processing, results determination, dose adjustment and documentation in [email or web address].

## 2018-03-13 NOTE — Telephone Encounter (Addendum)
Pt states an outbreak is coming and with would like to have rx filled.  Pt also states a male he previous sexual encounter with stated she had trichomonas .  Pt is currently asymptomatic and would like to be tested.  Pt offered an appt for tomorrow, but has scheduling conflicts due to work.  Discussed with the attending-pt can provide a urine specimen today, but MUST return to clinic for follow up/results.  Pt not sure if that's what he wants to do, will have him speak to triage nurse.Kingsley SpittleGoldston, Lief Palmatier Cassady7/22/20194:52 PM

## 2018-03-13 NOTE — Telephone Encounter (Signed)
Received refill request from pt's pharmacy   Valacyclovir 500mg  take one twice daily #10 Last filled on 01/24/2018  Medication no longer on list, will send request to pcp for consideration.Marland Kitchen.Marland Kitchen.Kingsley SpittleGoldston, Akiah Bauch Cassady7/22/20192:39 PM

## 2018-03-13 NOTE — Patient Instructions (Signed)
Patient instructed to take medications as defined in the Anti-coagulation Track section of this encounter.  Patient instructed to take today's dose.  Patient instructed to take  one (1) of your 5mg  peach-colored warfarin tablets on Mondays, Tuesdays, Wednesdays, Thursdays, Fridays and Saturdays. On Wednesdays, take 1 and 1/2 tablets of your 5mg  peach-colored warfarin tablets.  Patient verbalized understanding of these instructions.

## 2018-03-14 MED ORDER — VALACYCLOVIR HCL 500 MG PO TABS: 500.0000 mg | ORAL_TABLET | Freq: Two times a day (BID) | ORAL | 0 refills | Status: AC

## 2018-03-14 NOTE — Addendum Note (Signed)
Addended by: Evelena LeydenHARBRECHT, Mirka Barbone E on: 03/14/2018 11:54 AM   Modules accepted: Orders

## 2018-03-15 NOTE — Progress Notes (Signed)
I reviewed Dr. Saralyn PilarGroce's note.  Patient is on coumadin for recurrent VTE.  Her INR was above goal and coumadin decreased.

## 2018-03-17 ENCOUNTER — Ambulatory Visit (INDEPENDENT_AMBULATORY_CARE_PROVIDER_SITE_OTHER): Payer: BLUE CROSS/BLUE SHIELD | Admitting: Internal Medicine

## 2018-03-17 ENCOUNTER — Other Ambulatory Visit (HOSPITAL_COMMUNITY)
Admission: RE | Admit: 2018-03-17 | Discharge: 2018-03-17 | Disposition: A | Discharge status: Home / Self Care | Payer: BLUE CROSS/BLUE SHIELD | Source: Ambulatory Visit | Attending: Internal Medicine | Admitting: Internal Medicine

## 2018-03-17 ENCOUNTER — Other Ambulatory Visit: Payer: Self-pay

## 2018-03-17 VITALS — BP 129/66 | HR 88 | Temp 98.6°F | Ht 72.0 in | Wt 199.6 lb

## 2018-03-17 DIAGNOSIS — F17290 Nicotine dependence, other tobacco product, uncomplicated: Secondary | ICD-10-CM

## 2018-03-17 DIAGNOSIS — Z86711 Personal history of pulmonary embolism: Secondary | ICD-10-CM | POA: Diagnosis not present

## 2018-03-17 DIAGNOSIS — Z86718 Personal history of other venous thrombosis and embolism: Secondary | ICD-10-CM | POA: Diagnosis not present

## 2018-03-17 DIAGNOSIS — Z0000 Encounter for general adult medical examination without abnormal findings: Secondary | ICD-10-CM | POA: Diagnosis not present

## 2018-03-17 DIAGNOSIS — Z8619 Personal history of other infectious and parasitic diseases: Secondary | ICD-10-CM

## 2018-03-17 DIAGNOSIS — Z202 Contact with and (suspected) exposure to infections with a predominantly sexual mode of transmission: Secondary | ICD-10-CM

## 2018-03-17 DIAGNOSIS — Z833 Family history of diabetes mellitus: Secondary | ICD-10-CM

## 2018-03-17 DIAGNOSIS — Z7901 Long term (current) use of anticoagulants: Secondary | ICD-10-CM

## 2018-03-17 LAB — POCT GLYCOSYLATED HEMOGLOBIN (HGB A1C): Hemoglobin A1C: 5.7 % — AB (ref 4.0–5.6)

## 2018-03-17 LAB — GLUCOSE, CAPILLARY: Glucose-Capillary: 88 mg/dL (ref 70–99)

## 2018-03-17 NOTE — Assessment & Plan Note (Signed)
Continues on Coumadin for history of unprovoked DVT and PE.  Patient inquires about possibility of switching to a NOAC.  After chart review I see no reason to not switch to NOAC.  No h/o mechanical heart valves. Patient has not had renal function checked in a couple years.  Plan: - f/u discussion with patient about NOAC and check BMP at that time

## 2018-03-17 NOTE — Assessment & Plan Note (Addendum)
Heterosexual male.  Currently asymptomatic.  Male partner diagnosed with trichomonas recently.  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  Last used it this week.  Genital exam unremarkable  Plan: -We will test for GC, chlamydia, syphilis, HIV, trichomonas -Patient had multiple questions about HIV transmission and prep.  I do not think his current sexual behavior qualifies as high risk, but I do wonder if he is being completely honest.  Will need follow-up discussion about sexual behaviors and risk benefit ratio of prep.

## 2018-03-17 NOTE — Progress Notes (Signed)
   CC: STI check  HPI:  [name] is a 42 y.o. male with history of PE and DVT on chronic anticoagulation, HSV-2 who presents for STD screening.  He has had two partners in the last 6 months, both women.  Denies oral sex.  His current sex partner was recently diagnosed with trichomonas and he wants to get checked for "all the things."  He is currently asymptomatic.  Denies penile discharge, dysuria, burning or pain with sex.  He has history of HSV-2 and takes valacyclovir as needed.  He last took it this week when he noticed some burning in his genital area.  The burning has since resolved.  Also denies weight loss, diarrhea, decreased appetite, skin rash.  He smokes cigars occasionally and drinks alcohol occasionally.  He denies any illicit or IV drug use.  Past Medical History:  Diagnosis Date  . DVT (deep venous thrombosis) (HCC)   . Seasonal allergies     Physical Exam:  Vitals:   03/17/18 1559  BP: 129/66  Pulse: 88  Temp: 98.6 F (37 C)  TempSrc: Oral  SpO2: 95%  Weight: 199 lb 9.6 oz (90.5 kg)  Height: 6' (1.829 m)   Gen: Well appearing, NAD CV: RRR, no murmurs Pulm: Normal effort, CTA throughout, no wheezing Ext: Warm, no edema Skin:  No rashes GU: no penile discharge or rash. No active vesicular lesions  Assessment & Plan:   See Encounters Tab for problem based charting.  Patient seen with Dr. Sandre Kittyaines

## 2018-03-17 NOTE — Assessment & Plan Note (Signed)
Family history of diabetes.  Patient patient endorses occasional dry mouth, increased thirst.  Denies urinary frequency, vision changes.  Weight loss.  Has never had an A1c drawn.  Previous glucose has been slightly elevated.  Plan: -A1c today

## 2018-03-17 NOTE — Patient Instructions (Signed)
It was nice seeing you today. Thank you for choosing [facility] for your Primary Care.   We are testing you for trichomonas, gonorrhea, chlamydia, HIV, and syphilis. I will call you with the results  I am also testing you for diabetes   FOLLOW-UP INSTRUCTIONS When: as needed  Please contact the clinic if you have any problems, or need to be seen sooner.

## 2018-03-18 LAB — RPR: RPR Ser Ql: NONREACTIVE

## 2018-03-18 LAB — HIV ANTIBODY (ROUTINE TESTING W REFLEX): HIV Screen 4th Generation wRfx: NONREACTIVE

## 2018-03-20 LAB — URINE CYTOLOGY ANCILLARY ONLY
Chlamydia: NEGATIVE
Neisseria Gonorrhea: NEGATIVE
Trichomonas: NEGATIVE

## 2018-03-20 NOTE — Progress Notes (Signed)
Internal Medicine Clinic Attending  I saw and evaluated the patient.  I personally confirmed the key portions of the history and exam documented by Dr. Vogel and I reviewed pertinent patient test results.  The assessment, diagnosis, and plan were formulated together and I agree with the documentation in the resident's note.  Richard [name], M.D., Ph.D.  

## 2018-03-27 ENCOUNTER — Telehealth: Payer: Self-pay | Admitting: *Deleted

## 2018-03-27 NOTE — Telephone Encounter (Signed)
A raised voice is totally inappropriate. Dr. Petra KubaVogel called the patient and left VM with results on 7/30. If he wants the record from that visit, he can request it through medical records. Our notes are also open access, so he should be able to see them through My Chart.

## 2018-03-27 NOTE — Telephone Encounter (Signed)
I'm sorry he spoke to you that way. He was very polite during my clinic visit. I did call him on 7/30 and left a voicemail stating, "All the lab results from your physical were normal. If you have any specific questions please call the clinic."

## 2018-03-27 NOTE — Telephone Encounter (Signed)
Pt called very angry from the start, raised voice stating he was "coming up the road and when he got here he wanted his recent office visit results printed out and they better be ready. I stated I would need to send the md a message to be able to release the results, he raised his voice to yelling and said he better get them when he gets here. May we print the results and have the attending possibly explain any that might need explaining, I dont see any that might except the A1C but I doubt that is why he is upset, I am sending a note to dr Midwifebutcher about his rude behavior Sending to dr's harbrecht, vogel, Midwifebutcher and attendings

## 2018-03-31 NOTE — Telephone Encounter (Signed)
Please let me know if inappropriate behavior continues. I looked back to 2016 and saw no other documented inapp behavior.

## 2018-03-31 NOTE — Telephone Encounter (Signed)
I have yet to meet Jon Digiacomo to my knowledge and as as such I sadly have little to contribute. I will closely monitor this moving forward and notify the necessary parties if this again occurs or continues.   Thank you,  Fransico HimLarry H

## 2018-04-17 ENCOUNTER — Ambulatory Visit (INDEPENDENT_AMBULATORY_CARE_PROVIDER_SITE_OTHER): Payer: BLUE CROSS/BLUE SHIELD | Admitting: Pharmacist

## 2018-04-17 DIAGNOSIS — Z7901 Long term (current) use of anticoagulants: Secondary | ICD-10-CM | POA: Diagnosis not present

## 2018-04-17 DIAGNOSIS — I80299 Phlebitis and thrombophlebitis of other deep vessels of unspecified lower extremity: Secondary | ICD-10-CM

## 2018-04-17 DIAGNOSIS — Z5181 Encounter for therapeutic drug level monitoring: Secondary | ICD-10-CM

## 2018-04-17 DIAGNOSIS — I2699 Other pulmonary embolism without acute cor pulmonale: Secondary | ICD-10-CM | POA: Diagnosis not present

## 2018-04-17 LAB — POCT INR: INR: 1.7 — AB (ref 2.0–3.0)

## 2018-04-17 NOTE — Patient Instructions (Signed)
Patient instructed to take medications as defined in the Anti-coagulation Track section of this encounter.  Patient instructed to take today's dose.  Patient instructed to take one (1) of your 5mg  peach-colored warfarin tablets every day EXCEPT on Mondays and Thursdays, take 1 and 1/2 tablets of your 5mg  peach-colored warfarin tablets on Mondays and Thursdays.  Patient verbalized understanding of these instructions.

## 2018-04-17 NOTE — Progress Notes (Signed)
Anticoagulation Management Richard [name] is a 42 y.o. male who reports to the clinic for monitoring of warfarin treatment.    Indication: PE, History of ; long term current use of anticoagulants. Duration: indefinite Supervising physician: Blanch MediaElizabeth Butcher  Anticoagulation Clinic Visit History: Patient does not report signs/symptoms of bleeding or thromboembolism  Other recent changes: No diet, medications, lifestyle changes endorsed.  Anticoagulation Episode Summary    Current INR goal:   2.0-3.0  TTR:   54.3 % (6 y)  Next INR check:   05/22/2018  INR from last check:   1.7! (04/17/2018)  Weekly max warfarin dose:     Target end date:   Indefinite  INR check location:   Anticoagulation Clinic  Preferred lab:     Send INR reminders to:      Indications   Pulmonary embolism and infarction (HCC) [I26.99] D V T [I80.299] Long term (current) use of anticoagulants [Z79.01]       Comments:           No Known Allergies Prior to Admission medications   Medication Sig Start Date End Date Taking? Authorizing Provider  cetirizine (ZYRTEC) 10 MG tablet Take 1 tablet (10 mg total) by mouth daily. 08/19/15  Yes Denton Brickruong, Diana M, MD  warfarin (COUMADIN) 5 MG tablet Take one (1) tablet by mouth, once-daily at 6PM, EXCEPT on Wednesdays, Saturdays and Sundays--take 1&1/2 tablets on these days. 01/23/18  Yes Elicia LampGroce, Sheina Mcleish B, RPH-CPP   Past Medical History:  Diagnosis Date  . DVT (deep venous thrombosis) (HCC)   . Seasonal allergies    Social History   Socioeconomic History  . Marital status: Single    Spouse name: Not on file  . Number of children: Not on file  . Years of education: Not on file  . Highest education level: Not on file  Occupational History  . Not on file  Social Needs  . Financial resource strain: Not on file  . Food insecurity:    Worry: Not on file    Inability: Not on file  . Transportation needs:    Medical: Not on file    Non-medical: Not on file  Tobacco Use   . Smoking status: Former Smoker    Years: 1.00    Types: Cigarettes, Cigars    Last attempt to quit: 04/05/2012    Years since quitting: 6.0  Substance and Sexual Activity  . Alcohol use: No    Alcohol/week: 0.0 standard drinks  . Drug use: No  . Sexual activity: Not on file  Lifestyle  . Physical activity:    Days per week: Not on file    Minutes per session: Not on file  . Stress: Not on file  Relationships  . Social connections:    Talks on phone: Not on file    Gets together: Not on file    Attends religious service: Not on file    Active member of club or organization: Not on file    Attends meetings of clubs or organizations: Not on file    Relationship status: Not on file  Other Topics Concern  . Not on file  Social History Narrative  . Not on file   Family History  Problem Relation Age of Onset  . Other Mother   . Other Father   . Pulmonary embolism Maternal Uncle     ASSESSMENT Recent Results: The most recent result is correlated with 37.5 mg per week: Lab Results  Component Value Date   INR  1.7 (A) 04/17/2018   INR 3.1 (A) 03/13/2018   INR 3.3 (A) 02/13/2018   PROTIME 20.4 (H) 03/16/2011    Anticoagulation Dosing: Description   Take one (1) of your 5mg  peach-colored warfarin tablets every day EXCEPT on Mondays and Thursdays, take 1 and 1/2 tablets of your 5mg  peach-colored warfarin tablets on Mondays and Thursdays.      INR today: Subtherapeutic  PLAN Weekly dose was increased by 7% to 40 mg per week  Patient Instructions  Patient instructed to take medications as defined in the Anti-coagulation Track section of this encounter.  Patient instructed to take today's dose.  Patient instructed to take one (1) of your 5mg  peach-colored warfarin tablets every day EXCEPT on Mondays and Thursdays, take 1 and 1/2 tablets of your 5mg  peach-colored warfarin tablets on Mondays and Thursdays.  Patient verbalized understanding of these instructions.      Patient advised to contact clinic or seek medical attention if signs/symptoms of bleeding or thromboembolism occur.  Patient verbalized understanding by repeating back information and was advised to contact me if further medication-related questions arise. Patient was also provided an information handout.  Follow-up Return in about 5 weeks (around 05/22/2018) for Follow up INR at 4:30PM.  [name], PharmD, CACP, CPP  15 minutes spent face-to-face with the patient during the encounter. 50% of time spent on education. 50% of time was spent on fingerstick point of care INR sample collection, processing, results determination, dose adjustment and documentation in [email or web address].

## 2018-05-22 ENCOUNTER — Ambulatory Visit (INDEPENDENT_AMBULATORY_CARE_PROVIDER_SITE_OTHER): Payer: BLUE CROSS/BLUE SHIELD | Admitting: Pharmacist

## 2018-05-22 ENCOUNTER — Encounter (INDEPENDENT_AMBULATORY_CARE_PROVIDER_SITE_OTHER): Payer: Self-pay

## 2018-05-22 DIAGNOSIS — I80299 Phlebitis and thrombophlebitis of other deep vessels of unspecified lower extremity: Secondary | ICD-10-CM | POA: Diagnosis not present

## 2018-05-22 DIAGNOSIS — Z5181 Encounter for therapeutic drug level monitoring: Secondary | ICD-10-CM | POA: Diagnosis not present

## 2018-05-22 DIAGNOSIS — I2699 Other pulmonary embolism without acute cor pulmonale: Secondary | ICD-10-CM

## 2018-05-22 DIAGNOSIS — Z7901 Long term (current) use of anticoagulants: Secondary | ICD-10-CM | POA: Diagnosis not present

## 2018-05-22 LAB — POCT INR: INR: 2.2 (ref 2.0–3.0)

## 2018-05-22 MED ORDER — WARFARIN SODIUM 5 MG PO TABS: ORAL_TABLET | ORAL | 2 refills | Status: DC

## 2018-05-22 NOTE — Patient Instructions (Signed)
Patient instructed to take medications as defined in the Anti-coagulation Track section of this encounter.  Patient instructed to take today's dose.  Patient instructed to take one (1) of your 5mg  peach-colored warfarin tablets every day EXCEPT on Mondays, Wednesdays and Fridays--take 1 and 1/2 tablets of your 5mg  peach-colored warfarin tablets on Mondays, Wednesdays and Fridays.   Patient verbalized understanding of these instructions.

## 2018-05-22 NOTE — Progress Notes (Signed)
Anticoagulation Management Josephine Rudnick is a 42 y.o. male who reports to the clinic for monitoring of warfarin treatment.    Indication: PE, history of; long term current use of anticoagulant.   Duration: indefinite Supervising physician: Earl Lagos  Anticoagulation Clinic Visit History: Patient does not report signs/symptoms of bleeding or thromboembolism  Other recent changes: No diet, medications, lifestyle changes endorsed.  Anticoagulation Episode Summary    Current INR goal:   2.0-3.0  TTR:   54.1 % (6.1 y)  Next INR check:   06/19/2018  INR from last check:   2.2 (05/22/2018)  Weekly max warfarin dose:     Target end date:   Indefinite  INR check location:   Anticoagulation Clinic  Preferred lab:     Send INR reminders to:      Indications   Pulmonary embolism and infarction (HCC) [I26.99] D V T [I80.299] Long term (current) use of anticoagulants [Z79.01]       Comments:           No Known Allergies Prior to Admission medications   Medication Sig Start Date End Date Taking? Authorizing Provider  cetirizine (ZYRTEC) 10 MG tablet Take 1 tablet (10 mg total) by mouth daily. 08/19/15  Yes Denton Brick, MD  warfarin (COUMADIN) 5 MG tablet Take one (1) tablet by mouth, once-daily at [facility], EXCEPT on  Mondays, Wednesdays, and Fridays--take 1&1/2 tablets on these days. 05/22/18  Yes [name], RPH-CPP   Past Medical History:  Diagnosis Date  . DVT (deep venous thrombosis) (HCC)   . Seasonal allergies    Social History   Socioeconomic History  . Marital status: Single    Spouse name: Not on file  . Number of children: Not on file  . Years of education: Not on file  . Highest education level: Not on file  Occupational History  . Not on file  Social Needs  . Financial resource strain: Not on file  . Food insecurity:    Worry: Not on file    Inability: Not on file  . Transportation needs:    Medical: Not on file    Non-medical: Not on file  Tobacco  Use  . Smoking status: Former Smoker    Years: 1.00    Types: Cigarettes, Cigars    Last attempt to quit: 04/05/2012    Years since quitting: 6.1  Substance and Sexual Activity  . Alcohol use: No    Alcohol/week: 0.0 standard drinks  . Drug use: No  . Sexual activity: Not on file  Lifestyle  . Physical activity:    Days per week: Not on file    Minutes per session: Not on file  . Stress: Not on file  Relationships  . Social connections:    Talks on phone: Not on file    Gets together: Not on file    Attends religious service: Not on file    Active member of club or organization: Not on file    Attends meetings of clubs or organizations: Not on file    Relationship status: Not on file  Other Topics Concern  . Not on file  Social History Narrative  . Not on file   Family History  Problem Relation Age of Onset  . Other Mother   . Other Father   . Pulmonary embolism Maternal Uncle     ASSESSMENT Recent Results: The most recent result is correlated with 40 mg per week: Lab Results  Component Value Date  INR 2.2 05/22/2018   INR 1.7 (A) 04/17/2018   INR 3.1 (A) 03/13/2018   PROTIME 20.4 (H) 03/16/2011    Anticoagulation Dosing: Description   Take one (1) of your 5mg  peach-colored warfarin tablets every day EXCEPT on Mondays, Wednesdays and Fridays--take 1 and 1/2 tablets of your 5mg  peach-colored warfarin tablets on Mondays, Wednesdays and Fridays.       INR today: Therapeutic  PLAN Weekly dose was increased by 6% to 42.5 mg per week  Patient Instructions  Patient instructed to take medications as defined in the Anti-coagulation Track section of this encounter.  Patient instructed to take today's dose.  Patient instructed to take one (1) of your 5mg  peach-colored warfarin tablets every day EXCEPT on Mondays, Wednesdays and Fridays--take 1 and 1/2 tablets of your 5mg  peach-colored warfarin tablets on Mondays, Wednesdays and Fridays.   Patient verbalized  understanding of these instructions.     Patient advised to contact clinic or seek medical attention if signs/symptoms of bleeding or thromboembolism occur.  Patient verbalized understanding by repeating back information and was advised to contact me if further medication-related questions arise. Patient was also provided an information handout.  Follow-up Return in 4 weeks (on 06/19/2018) for Follow up INR at 4:30PM.  [name], PharmD, CACP, CPP  15 minutes spent face-to-face with the patient during the encounter. 50% of time spent on education. 50% of time was spent on fingerstick point of care INR sample collection, processing, results determination, dose adjustment, refill authorization and documentation in [email or web address].

## 2018-05-25 NOTE — Progress Notes (Signed)
INTERNAL MEDICINE TEACHING ATTENDING ADDENDUM - Jaiya Mooradian M.D  Duration- indefinite, Indication- recurrent PE, DVT, INR- therapeutic. Agree with pharmacy recommendations as outlined in their note.      

## 2018-06-19 ENCOUNTER — Ambulatory Visit (INDEPENDENT_AMBULATORY_CARE_PROVIDER_SITE_OTHER): Payer: BLUE CROSS/BLUE SHIELD | Admitting: Pharmacist

## 2018-06-19 DIAGNOSIS — I80299 Phlebitis and thrombophlebitis of other deep vessels of unspecified lower extremity: Secondary | ICD-10-CM

## 2018-06-19 DIAGNOSIS — I2699 Other pulmonary embolism without acute cor pulmonale: Secondary | ICD-10-CM

## 2018-06-19 DIAGNOSIS — Z7901 Long term (current) use of anticoagulants: Secondary | ICD-10-CM | POA: Diagnosis not present

## 2018-06-19 DIAGNOSIS — Z5181 Encounter for therapeutic drug level monitoring: Secondary | ICD-10-CM

## 2018-06-19 LAB — POCT INR: INR: 1.7 — AB (ref 2.0–3.0)

## 2018-06-19 NOTE — Progress Notes (Signed)
Anticoagulation Management Richard [name] is a 42 y.o. male who reports to the clinic for monitoring of warfarin treatment.    Indication: PE history of (resolved), long term current use of anticoagulant.   Duration: indefinite Supervising physician: Blanch Media  Anticoagulation Clinic Visit History: Patient does not report signs/symptoms of bleeding or thromboembolism  Other recent changes: No diet, medications, lifestyle changes endorsed.  Anticoagulation Episode Summary    Current INR goal:   2.0-3.0  TTR:   53.9 % (6.1 y)  Next INR check:   07/17/2018  INR from last check:   1.7! (06/19/2018)  Weekly max warfarin dose:     Target end date:   Indefinite  INR check location:   Anticoagulation Clinic  Preferred lab:     Send INR reminders to:      Indications   Pulmonary embolism and infarction (HCC) [I26.99] D V T [I80.299] Long term (current) use of anticoagulants [Z79.01]       Comments:           No Known Allergies Prior to Admission medications   Medication Sig Start Date End Date Taking? Authorizing Provider  cetirizine (ZYRTEC) 10 MG tablet Take 1 tablet (10 mg total) by mouth daily. 08/19/15  Yes Denton Brick, MD  warfarin (COUMADIN) 5 MG tablet Take one (1) tablet by mouth, once-daily at [facility], EXCEPT on  Mondays, Wednesdays, and Fridays--take 1&1/2 tablets on these days. 05/22/18  Yes [name], RPH-CPP   Past Medical History:  Diagnosis Date  . DVT (deep venous thrombosis) (HCC)   . Seasonal allergies    Social History   Socioeconomic History  . Marital status: Single    Spouse name: Not on file  . Number of children: Not on file  . Years of education: Not on file  . Highest education level: Not on file  Occupational History  . Not on file  Social Needs  . Financial resource strain: Not on file  . Food insecurity:    Worry: Not on file    Inability: Not on file  . Transportation needs:    Medical: Not on file    Non-medical: Not on  file  Tobacco Use  . Smoking status: Former Smoker    Years: 1.00    Types: Cigarettes, Cigars    Last attempt to quit: 04/05/2012    Years since quitting: 6.2  Substance and Sexual Activity  . Alcohol use: No    Alcohol/week: 0.0 standard drinks  . Drug use: No  . Sexual activity: Not on file  Lifestyle  . Physical activity:    Days per week: Not on file    Minutes per session: Not on file  . Stress: Not on file  Relationships  . Social connections:    Talks on phone: Not on file    Gets together: Not on file    Attends religious service: Not on file    Active member of club or organization: Not on file    Attends meetings of clubs or organizations: Not on file    Relationship status: Not on file  Other Topics Concern  . Not on file  Social History Narrative  . Not on file   Family History  Problem Relation Age of Onset  . Other Mother   . Other Father   . Pulmonary embolism Maternal Uncle     ASSESSMENT Recent Results: The most recent result is correlated with 42.5 mg per week: Lab Results  Component Value Date  INR 1.7 (A) 06/19/2018   INR 2.2 05/22/2018   INR 1.7 (A) 04/17/2018   PROTIME 20.4 (H) 03/16/2011    Anticoagulation Dosing: Description   Take one (1) tablet of your 5mg  peach-colored warfarin tablets on Sundays and Thursdays. All OTHER days--take one-and-one-half (1&1/2) tablets of your 5mg  peach-colored warfarin tablets.      INR today: Subtherapeutic  PLAN Weekly dose was increased by 12% to 47.5 mg per week  Patient Instructions  Patient instructed to take medications as defined in the Anti-coagulation Track section of this encounter.  Patient instructed to take today's dose.  Patient instructed to take one (1) tablet of your 5mg  peach-colored warfarin tablets on Sundays and Thursdays. All OTHER days--take one-and-one-half (1&1/2) tablets of your 5mg  peach-colored warfarin tablets.  Patient verbalized understanding of these instructions.      Patient advised to contact clinic or seek medical attention if signs/symptoms of bleeding or thromboembolism occur.  Patient verbalized understanding by repeating back information and was advised to contact me if further medication-related questions arise. Patient was also provided an information handout.  Follow-up Return in 4 weeks (on 07/17/2018) for Follow up INR at 4:30PM.  [name], PharmD, CPP  15 minutes spent face-to-face with the patient during the encounter. 50% of time spent on education. 50% of time was spent on fingerstick point of care INR sample collection, processing, results determination, dose adjustment and documentation in [email or web address].

## 2018-06-19 NOTE — Patient Instructions (Signed)
Patient instructed to take medications as defined in the Anti-coagulation Track section of this encounter.  Patient instructed to take today's dose.  Patient instructed to take one (1) tablet of your 5mg  peach-colored warfarin tablets on Sundays and Thursdays. All OTHER days--take one-and-one-half (1&1/2) tablets of your 5mg  peach-colored warfarin tablets.  Patient verbalized understanding of these instructions.

## 2018-06-22 ENCOUNTER — Telehealth: Payer: Self-pay | Admitting: Internal Medicine

## 2018-06-30 DIAGNOSIS — K047 Periapical abscess without sinus: Secondary | ICD-10-CM | POA: Diagnosis not present

## 2018-06-30 DIAGNOSIS — K05319 Chronic periodontitis, localized, unspecified severity: Secondary | ICD-10-CM | POA: Diagnosis not present

## 2018-07-13 ENCOUNTER — Other Ambulatory Visit: Payer: Self-pay | Admitting: Internal Medicine

## 2018-07-13 DIAGNOSIS — I2699 Other pulmonary embolism without acute cor pulmonale: Secondary | ICD-10-CM

## 2018-07-13 DIAGNOSIS — Z7901 Long term (current) use of anticoagulants: Secondary | ICD-10-CM

## 2018-07-17 ENCOUNTER — Encounter (INDEPENDENT_AMBULATORY_CARE_PROVIDER_SITE_OTHER): Payer: Self-pay

## 2018-07-17 ENCOUNTER — Ambulatory Visit (INDEPENDENT_AMBULATORY_CARE_PROVIDER_SITE_OTHER): Payer: BLUE CROSS/BLUE SHIELD | Admitting: Pharmacist

## 2018-07-17 DIAGNOSIS — Z7901 Long term (current) use of anticoagulants: Secondary | ICD-10-CM | POA: Diagnosis not present

## 2018-07-17 DIAGNOSIS — I2699 Other pulmonary embolism without acute cor pulmonale: Secondary | ICD-10-CM | POA: Diagnosis not present

## 2018-07-17 DIAGNOSIS — Z5181 Encounter for therapeutic drug level monitoring: Secondary | ICD-10-CM | POA: Diagnosis not present

## 2018-07-17 DIAGNOSIS — I80299 Phlebitis and thrombophlebitis of other deep vessels of unspecified lower extremity: Secondary | ICD-10-CM

## 2018-07-17 LAB — POCT INR: INR: 1.8 — AB (ref 2.0–3.0)

## 2018-07-17 NOTE — Patient Instructions (Signed)
Patient instructed to take medications as defined in the Anti-coagulation Track section of this encounter.  Patient instructed to take today's dose.  Patient instructed to take  one-and-one-half (1&1/2) tablets of your 5mg  peach-colored warfarin tablets by mouth, once-daily, at [facility]6PM. Patient verbalized understanding of these instructions.

## 2018-07-17 NOTE — Progress Notes (Signed)
Anticoagulation Management Richard [name] is a 42 y.o. male who reports to the clinic for monitoring of warfarin treatment.    Indication: PE and infarction (HCC), DVT, history of; long term current use of anticoagulant.   Duration: indefinite Supervising physician: Carlynn PurlErik Hoffman  Anticoagulation Clinic Visit History: Patient does not report signs/symptoms of bleeding or thromboembolism  Other recent changes: No diet, medications, lifestyle changes.  Anticoagulation Episode Summary    Current INR goal:   2.0-3.0  TTR:   53.3 % (6.2 y)  Next INR check:   08/28/2018  INR from last check:   1.8! (07/17/2018)  Weekly max warfarin dose:     Target end date:   Indefinite  INR check location:   Anticoagulation Clinic  Preferred lab:     Send INR reminders to:      Indications   Pulmonary embolism and infarction (HCC) [I26.99] D V T [I80.299] Long term (current) use of anticoagulants [Z79.01]       Comments:           No Known Allergies Prior to Admission medications   Medication Sig Start Date End Date Taking? Authorizing Provider  cetirizine (ZYRTEC) 10 MG tablet Take 1 tablet (10 mg total) by mouth daily. 08/19/15  Yes Denton Brickruong, Diana M, MD  warfarin (COUMADIN) 5 MG tablet Take one (1) tablet by mouth, once-daily at [facility]6PM, EXCEPT on  Mondays, Wednesdays, and Fridays--take 1&1/2 tablets on these days. 05/22/18  Yes Elicia LampGroce, Ingris Pasquarella B, RPH-CPP  warfarin (COUMADIN) 5 MG tablet TAKE 1 AND 1/2 TABLET BY MOUTH ONCE DAILY AT 6 IN THE EVENING, EXCEPT ON TUESDAY AND THURSDAY TAKE 1 TABLET 07/17/18  Yes Anne Shutteraines, Alexander N, MD   Past Medical History:  Diagnosis Date  . DVT (deep venous thrombosis) (HCC)   . Seasonal allergies    Social History   Socioeconomic History  . Marital status: Single    Spouse name: Not on file  . Number of children: Not on file  . Years of education: Not on file  . Highest education level: Not on file  Occupational History  . Not on file  Social Needs  .  Financial resource strain: Not on file  . Food insecurity:    Worry: Not on file    Inability: Not on file  . Transportation needs:    Medical: Not on file    Non-medical: Not on file  Tobacco Use  . Smoking status: Former Smoker    Years: 1.00    Types: Cigarettes, Cigars    Last attempt to quit: 04/05/2012    Years since quitting: 6.2  Substance and Sexual Activity  . Alcohol use: No    Alcohol/week: 0.0 standard drinks  . Drug use: No  . Sexual activity: Not on file  Lifestyle  . Physical activity:    Days per week: Not on file    Minutes per session: Not on file  . Stress: Not on file  Relationships  . Social connections:    Talks on phone: Not on file    Gets together: Not on file    Attends religious service: Not on file    Active member of club or organization: Not on file    Attends meetings of clubs or organizations: Not on file    Relationship status: Not on file  Other Topics Concern  . Not on file  Social History Narrative  . Not on file   Family History  Problem Relation Age of Onset  . Other  Mother   . Other Father   . Pulmonary embolism Maternal Uncle     ASSESSMENT Recent Results: The most recent result is correlated with 47.5 mg per week: Lab Results  Component Value Date   INR 1.8 (A) 07/17/2018   INR 1.7 (A) 06/19/2018   INR 2.2 05/22/2018   PROTIME 20.4 (H) 03/16/2011    Anticoagulation Dosing: Description   Take one-and-one-half (1&1/2) tablets of your 5mg  peach-colored warfarin tablets by mouth, once-daily, at 6PM.      INR today: Subtherapeutic  PLAN Weekly dose was decreased by 10.5% to 52.5 mg per week  Patient Instructions  Patient instructed to take medications as defined in the Anti-coagulation Track section of this encounter.  Patient instructed to take today's dose.  Patient instructed to take  one-and-one-half (1&1/2) tablets of your 5mg  peach-colored warfarin tablets by mouth, once-daily, at [facility]. Patient verbalized  understanding of these instructions.     Patient advised to contact clinic or seek medical attention if signs/symptoms of bleeding or thromboembolism occur.  Patient verbalized understanding by repeating back information and was advised to contact me if further medication-related questions arise. Patient was also provided an information handout.  Follow-up Return in 6 weeks (on 08/28/2018) for Follow up INR at 4:30PM.  [name], PharmD, CPP  15 minutes spent face-to-face with the patient during the encounter. 50% of time spent on education. 50% of time was spent on fingerstick point of care INR sample collection, processing, results determination, dose adjustment and documentation in [email or web address].

## 2018-07-28 NOTE — Progress Notes (Signed)
INTERNAL MEDICINE TEACHING ATTENDING ADDENDUM - Gust RungHoffman, Keiyana Stehr C, DO Duration- indefinate, Indication- VTE, INR-  Lab Results  Component Value Date   INR 1.8 (A) 07/17/2018   PROTIME 20.4 (H) 03/16/2011  . Agree with pharmacy recommendations as outlined in their note.

## 2018-08-28 ENCOUNTER — Ambulatory Visit (INDEPENDENT_AMBULATORY_CARE_PROVIDER_SITE_OTHER): Payer: BLUE CROSS/BLUE SHIELD | Admitting: Pharmacist

## 2018-08-28 ENCOUNTER — Encounter: Payer: Self-pay | Admitting: Pharmacist

## 2018-08-28 DIAGNOSIS — Z7901 Long term (current) use of anticoagulants: Secondary | ICD-10-CM

## 2018-08-28 DIAGNOSIS — Z5181 Encounter for therapeutic drug level monitoring: Secondary | ICD-10-CM

## 2018-08-28 DIAGNOSIS — I2699 Other pulmonary embolism without acute cor pulmonale: Secondary | ICD-10-CM

## 2018-08-28 LAB — POCT INR: INR: 3.7 — AB (ref 2.0–3.0)

## 2018-08-28 NOTE — Progress Notes (Signed)
Anticoagulation Management Richard GandyMicha [name] is a 43 y.o. male who reports to the clinic for monitoring of warfarin treatment.    Indication: PE and infarction, chronic long term (current) use of anticoagulant.   Duration: indefinite Supervising physician: Earl LagosNischal Narendra  Anticoagulation Clinic Visit History: Patient does not report signs/symptoms of bleeding or thromboembolism  Other recent changes: No diet, medications, lifestyle changes.  Anticoagulation Episode Summary    Current INR goal:   2.0-3.0  TTR:   53.2 % (6.3 y)  Next INR check:   09/25/2018  INR from last check:   3.7! (08/28/2018)  Weekly max warfarin dose:     Target end date:   Indefinite  INR check location:   Anticoagulation Clinic  Preferred lab:     Send INR reminders to:      Indications   Pulmonary embolism and infarction (HCC) [I26.99] D V T [I80.299] Long term (current) use of anticoagulants [Z79.01]       Comments:           No Known Allergies Prior to Admission medications   Medication Sig Start Date End Date Taking? Authorizing Provider  cetirizine (ZYRTEC) 10 MG tablet Take 1 tablet (10 mg total) by mouth daily. 08/19/15  Yes Denton Brickruong, Diana M, MD  warfarin (COUMADIN) 5 MG tablet Take one (1) tablet by mouth, once-daily at [facility], EXCEPT on  Mondays, Wednesdays, and Fridays--take 1&1/2 tablets on these days. 05/22/18  Yes Elicia LampGroce, Derrell Milanes B, RPH-CPP  warfarin (COUMADIN) 5 MG tablet TAKE 1 AND 1/2 TABLET BY MOUTH ONCE DAILY AT 6 IN THE EVENING, EXCEPT ON TUESDAY AND THURSDAY TAKE 1 TABLET 07/17/18  Yes Anne Shutteraines, Alexander N, MD   Past Medical History:  Diagnosis Date  . DVT (deep venous thrombosis) (HCC)   . Seasonal allergies    Social History   Socioeconomic History  . Marital status: Single    Spouse name: Not on file  . Number of children: Not on file  . Years of education: Not on file  . Highest education level: Not on file  Occupational History  . Not on file  Social Needs  . Financial resource  strain: Not on file  . Food insecurity:    Worry: Not on file    Inability: Not on file  . Transportation needs:    Medical: Not on file    Non-medical: Not on file  Tobacco Use  . Smoking status: Former Smoker    Years: 1.00    Types: Cigarettes, Cigars    Last attempt to quit: 04/05/2012    Years since quitting: 6.4  Substance and Sexual Activity  . Alcohol use: No    Alcohol/week: 0.0 standard drinks  . Drug use: No  . Sexual activity: Not on file  Lifestyle  . Physical activity:    Days per week: Not on file    Minutes per session: Not on file  . Stress: Not on file  Relationships  . Social connections:    Talks on phone: Not on file    Gets together: Not on file    Attends religious service: Not on file    Active member of club or organization: Not on file    Attends meetings of clubs or organizations: Not on file    Relationship status: Not on file  Other Topics Concern  . Not on file  Social History Narrative  . Not on file   Family History  Problem Relation Age of Onset  . Other Mother   .  Other Father   . Pulmonary embolism Maternal Uncle     ASSESSMENT Recent Results: The most recent result is correlated with 52.5 mg per week: Lab Results  Component Value Date   INR 3.7 (A) 08/28/2018   INR 1.8 (A) 07/17/2018   INR 1.7 (A) 06/19/2018   PROTIME 20.4 (H) 03/16/2011    Anticoagulation Dosing: Description   Take one-and-one-half (1&1/2) tablets of your 5mg  peach-colored warfarin tablets by mouth, once-daily, at 6PM--EXCEPT on MONDAYS. On MONDAYS, take only one-half (1/2) of your 5mg  peach-colored warfarin tablet each Monday.      INR today: Supratherapeutic  PLAN Weekly dose was decreased by 5% to 50 mg per week  Patient Instructions  Patient instructed to take medications as defined in the Anti-coagulation Track section of this encounter.  Patient instructed to take today's dose.  Patient instructed to take one-and-one-half (1&1/2) tablets of  your 5mg  peach-colored warfarin tablets by mouth, once-daily, at 6PM--EXCEPT on MONDAYS. On MONDAYS, take only one-half (1/2) of your 5mg  peach-colored warfarin tablet each Monday.  Patient verbalized understanding of these instructions.     Patient advised to contact clinic or seek medical attention if signs/symptoms of bleeding or thromboembolism occur.  Patient verbalized understanding by repeating back information and was advised to contact me if further medication-related questions arise. Patient was also provided an information handout.  Follow-up Return in 4 weeks (on 09/25/2018) for Follow up INR at 4:30PM.  [name], PharmD, CPP  15 minutes spent face-to-face with the patient during the encounter. 50% of time spent on education. 50% of time was spent on fingerstick point of care INR sample collection, processing, results determination, dose adjustment and documentation in [email or web address].

## 2018-08-28 NOTE — Patient Instructions (Signed)
Patient instructed to take medications as defined in the Anti-coagulation Track section of this encounter.  Patient instructed to take today's dose.  Patient instructed to take one-and-one-half (1&1/2) tablets of your 5mg  peach-colored warfarin tablets by mouth, once-daily, at 6PM--EXCEPT on MONDAYS. On MONDAYS, take only one-half (1/2) of your 5mg  peach-colored warfarin tablet each Monday.  Patient verbalized understanding of these instructions.

## 2018-08-29 NOTE — Progress Notes (Signed)
INTERNAL MEDICINE TEACHING ATTENDING ADDENDUM - Earl LagosNischal Burnice Oestreicher M.D  Duration- indefinite, Indication- PE. DVT, INR- supratherapeutic. Agree with pharmacy recommendations as outlined in their note.

## 2018-09-25 ENCOUNTER — Encounter: Payer: Self-pay | Admitting: Pharmacist

## 2018-09-25 ENCOUNTER — Ambulatory Visit (INDEPENDENT_AMBULATORY_CARE_PROVIDER_SITE_OTHER): Payer: BLUE CROSS/BLUE SHIELD | Admitting: Pharmacist

## 2018-09-25 DIAGNOSIS — Z7901 Long term (current) use of anticoagulants: Secondary | ICD-10-CM

## 2018-09-25 DIAGNOSIS — I80299 Phlebitis and thrombophlebitis of other deep vessels of unspecified lower extremity: Secondary | ICD-10-CM

## 2018-09-25 DIAGNOSIS — I2699 Other pulmonary embolism without acute cor pulmonale: Secondary | ICD-10-CM

## 2018-09-25 DIAGNOSIS — Z5181 Encounter for therapeutic drug level monitoring: Secondary | ICD-10-CM

## 2018-09-25 LAB — POCT INR: INR: 1.7 — AB (ref 2.0–3.0)

## 2018-09-25 NOTE — Progress Notes (Signed)
Anticoagulation Management Richard [name] is a 43 y.o. male who reports to the clinic for monitoring of warfarin treatment.    Indication: PE, history of; long term current use of anticoagulants.  Duration: indefinite Supervising physician: Carlynn Purl  Anticoagulation Clinic Visit History: Patient does not report signs/symptoms of bleeding or thromboembolism  Other recent changes: No diet, medications, lifestyle changes.  Anticoagulation Episode Summary    Current INR goal:   2.0-3.0  TTR:   53.2 % (6.4 y)  Next INR check:   11/06/2018  INR from last check:   1.7! (09/25/2018)  Weekly max warfarin dose:     Target end date:   Indefinite  INR check location:   Anticoagulation Clinic  Preferred lab:     Send INR reminders to:      Indications   Pulmonary embolism and infarction (HCC) [I26.99] D V T [I80.299] Long term (current) use of anticoagulants [Z79.01]       Comments:           No Known Allergies Prior to Admission medications   Medication Sig Start Date End Date Taking? Authorizing Provider  cetirizine (ZYRTEC) 10 MG tablet Take 1 tablet (10 mg total) by mouth daily. 08/19/15  Yes Denton Brick, MD  warfarin (COUMADIN) 5 MG tablet Take one (1) tablet by mouth, once-daily at [facility], EXCEPT on  Mondays, Wednesdays, and Fridays--take 1&1/2 tablets on these days. 05/22/18  Yes [name], RPH-CPP  warfarin (COUMADIN) 5 MG tablet TAKE 1 AND 1/2 TABLET BY MOUTH ONCE DAILY AT 6 IN THE EVENING, EXCEPT ON TUESDAY AND THURSDAY TAKE 1 TABLET Patient not taking: Reported on 09/25/2018 07/17/18   Anne Shutter, MD   Past Medical History:  Diagnosis Date  . DVT (deep venous thrombosis) (HCC)   . Seasonal allergies    Social History   Socioeconomic History  . Marital status: Single    Spouse name: Not on file  . Number of children: Not on file  . Years of education: Not on file  . Highest education level: Not on file  Occupational History  . Not on file  Social Needs   . Financial resource strain: Not on file  . Food insecurity:    Worry: Not on file    Inability: Not on file  . Transportation needs:    Medical: Not on file    Non-medical: Not on file  Tobacco Use  . Smoking status: Former Smoker    Years: 1.00    Types: Cigarettes, Cigars    Last attempt to quit: 04/05/2012    Years since quitting: 6.4  Substance and Sexual Activity  . Alcohol use: No    Alcohol/week: 0.0 standard drinks  . Drug use: No  . Sexual activity: Not on file  Lifestyle  . Physical activity:    Days per week: Not on file    Minutes per session: Not on file  . Stress: Not on file  Relationships  . Social connections:    Talks on phone: Not on file    Gets together: Not on file    Attends religious service: Not on file    Active member of club or organization: Not on file    Attends meetings of clubs or organizations: Not on file    Relationship status: Not on file  Other Topics Concern  . Not on file  Social History Narrative  . Not on file   Family History  Problem Relation Age of Onset  .  Other Mother   . Other Father   . Pulmonary embolism Maternal Uncle     ASSESSMENT Recent Results: The most recent result is correlated with 50 mg per week: Lab Results  Component Value Date   INR 1.7 (A) 09/25/2018   INR 3.7 (A) 08/28/2018   INR 1.8 (A) 07/17/2018   PROTIME 20.4 (H) 03/16/2011    Anticoagulation Dosing: Description   Take one-and-one-half (1&1/2) tablets of your 5mg  peach-colored warfarin tablets by mouth, once-daily, at 6PM.      INR today: Subtherapeutic  PLAN Weekly dose was increased by 5% to 52.5 mg per week  Patient Instructions  Patient instructed to take medications as defined in the Anti-coagulation Track section of this encounter.  Patient instructed to take today's dose.  Patient instructed to take one-and-one-half (1&1/2) tablets of your 5mg  peach-colored warfarin tablets by mouth, once-daily, at [facility]. Patient verbalized  understanding of these instructions.     Patient advised to contact clinic or seek medical attention if signs/symptoms of bleeding or thromboembolism occur.  Patient verbalized understanding by repeating back information and was advised to contact me if further medication-related questions arise. Patient was also provided an information handout.  Follow-up Return in 6 weeks (on 11/06/2018) for Follow up INR at 4:30PM.  [name], PharmD, CPP  15 minutes spent face-to-face with the patient during the encounter. 50% of time spent on education. 50% of time was spent on fingerstick point of care INR sample collection, processing, results determination, dose adjustment and documentation in [email or web address].

## 2018-09-25 NOTE — Patient Instructions (Signed)
Patient instructed to take medications as defined in the Anti-coagulation Track section of this encounter.  Patient instructed to take today's dose.  Patient instructed to take one-and-one-half (1&1/2) tablets of your 5mg  peach-colored warfarin tablets by mouth, once-daily, at [facility]. Patient verbalized understanding of these instructions.

## 2018-10-02 ENCOUNTER — Ambulatory Visit: Payer: BLUE CROSS/BLUE SHIELD

## 2018-11-06 ENCOUNTER — Ambulatory Visit (INDEPENDENT_AMBULATORY_CARE_PROVIDER_SITE_OTHER): Payer: BLUE CROSS/BLUE SHIELD | Admitting: Pharmacist

## 2018-11-06 ENCOUNTER — Encounter: Payer: Self-pay | Admitting: Pharmacist

## 2018-11-06 ENCOUNTER — Encounter (INDEPENDENT_AMBULATORY_CARE_PROVIDER_SITE_OTHER): Payer: Self-pay

## 2018-11-06 ENCOUNTER — Other Ambulatory Visit: Payer: Self-pay

## 2018-11-06 DIAGNOSIS — Z7901 Long term (current) use of anticoagulants: Secondary | ICD-10-CM

## 2018-11-06 DIAGNOSIS — I80299 Phlebitis and thrombophlebitis of other deep vessels of unspecified lower extremity: Secondary | ICD-10-CM | POA: Diagnosis not present

## 2018-11-06 DIAGNOSIS — I2699 Other pulmonary embolism without acute cor pulmonale: Secondary | ICD-10-CM

## 2018-11-06 DIAGNOSIS — Z5181 Encounter for therapeutic drug level monitoring: Secondary | ICD-10-CM | POA: Diagnosis not present

## 2018-11-06 LAB — POCT INR: INR: 2.9 (ref 2.0–3.0)

## 2018-11-06 NOTE — Patient Instructions (Signed)
Patient instructed to take medications as defined in the Anti-coagulation Track section of this encounter.  Patient instructed to take today's dose.  Patient instructed to take one-and-one-half (1&1/2) tablets of your 5mg  peach-colored warfarin tablets by mouth, once-daily, at 6PM--EXCEPT on  MONDAYS, take only one (1) of your 5mg  peach-colored warfarin tablets on MONDAYS. Patient verbalized understanding of these instructions.

## 2018-11-06 NOTE — Progress Notes (Signed)
Anticoagulation Management Richard [name] is a 43 y.o. male who reports to the clinic for monitoring of warfarin treatment.    Indication: Pulmonary Embolism and infarction; Long term (current) use of anticoagulant.   Duration: indefinite Supervising physician: Carlynn Purl  Anticoagulation Clinic Visit History: Patient does not report signs/symptoms of bleeding or thromboembolism  Other recent changes: No diet, medications, lifestyle changes.  Anticoagulation Episode Summary    Current INR goal:   2.0-3.0  TTR:   53.6 % (6.5 y)  Next INR check:   12/18/2018  INR from last check:   2.9 (11/06/2018)  Weekly max warfarin dose:     Target end date:   Indefinite  INR check location:   Anticoagulation Clinic  Preferred lab:     Send INR reminders to:      Indications   Pulmonary embolism and infarction (HCC) [I26.99] D V T [I80.299] Long term (current) use of anticoagulants [Z79.01]       Comments:           No Known Allergies Prior to Admission medications   Medication Sig Start Date End Date Taking? Authorizing Provider  cetirizine (ZYRTEC) 10 MG tablet Take 1 tablet (10 mg total) by mouth daily. 08/19/15  Yes Denton Brick, MD  warfarin (COUMADIN) 5 MG tablet Take one (1) tablet by mouth, once-daily at [facility], EXCEPT on  Mondays, Wednesdays, and Fridays--take 1&1/2 tablets on these days. 05/22/18  Yes [name], RPH-CPP  warfarin (COUMADIN) 5 MG tablet TAKE 1 AND 1/2 TABLET BY MOUTH ONCE DAILY AT 6 IN THE EVENING, EXCEPT ON TUESDAY AND THURSDAY TAKE 1 TABLET 07/17/18  Yes Anne Shutter, MD   Past Medical History:  Diagnosis Date  . DVT (deep venous thrombosis) (HCC)   . Seasonal allergies    Social History   Socioeconomic History  . Marital status: Single    Spouse name: Not on file  . Number of children: Not on file  . Years of education: Not on file  . Highest education level: Not on file  Occupational History  . Not on file  Social Needs  . Financial  resource strain: Not on file  . Food insecurity:    Worry: Not on file    Inability: Not on file  . Transportation needs:    Medical: Not on file    Non-medical: Not on file  Tobacco Use  . Smoking status: Former Smoker    Years: 1.00    Types: Cigarettes, Cigars    Last attempt to quit: 04/05/2012    Years since quitting: 6.5  Substance and Sexual Activity  . Alcohol use: No    Alcohol/week: 0.0 standard drinks  . Drug use: No  . Sexual activity: Not on file  Lifestyle  . Physical activity:    Days per week: Not on file    Minutes per session: Not on file  . Stress: Not on file  Relationships  . Social connections:    Talks on phone: Not on file    Gets together: Not on file    Attends religious service: Not on file    Active member of club or organization: Not on file    Attends meetings of clubs or organizations: Not on file    Relationship status: Not on file  Other Topics Concern  . Not on file  Social History Narrative  . Not on file   Family History  Problem Relation Age of Onset  . Other Mother   .  Other Father   . Pulmonary embolism Maternal Uncle     ASSESSMENT Recent Results: The most recent result is correlated with 52.5 mg per week: Lab Results  Component Value Date   INR 2.9 11/06/2018   INR 1.7 (A) 09/25/2018   INR 3.7 (A) 08/28/2018   PROTIME 20.4 (H) 03/16/2011    Anticoagulation Dosing: Description   Take one-and-one-half (1&1/2) tablets of your 5mg  peach-colored warfarin tablets by mouth, once-daily, at 6PM--EXCEPT on  MONDAYS, take only one (1) of your 5mg  peach-colored warfarin tablets on MONDAYS.     INR today: Therapeutic  PLAN Weekly dose was decreased by 5% to 50 mg per week  Patient Instructions  Patient instructed to take medications as defined in the Anti-coagulation Track section of this encounter.  Patient instructed to take today's dose.  Patient instructed to take one-and-one-half (1&1/2) tablets of your 5mg   peach-colored warfarin tablets by mouth, once-daily, at 6PM--EXCEPT on  MONDAYS, take only one (1) of your 5mg  peach-colored warfarin tablets on MONDAYS. Patient verbalized understanding of these instructions.     Patient advised to contact clinic or seek medical attention if signs/symptoms of bleeding or thromboembolism occur.  Patient verbalized understanding by repeating back information and was advised to contact me if further medication-related questions arise. Patient was also provided an information handout.  Follow-up Return in 6 weeks (on 12/18/2018) for Follow up INR at [facility], [address], [place].  [name] , PharmD, CPP  15 minutes spent face-to-face with the patient during the encounter. 50% of time spent on education, including signs/sx bleeding and clotting, as well as food and drug interactions with warfarin. 50% of time was spent on fingerprick POC INR sample collection,processing, results determination, and documentation in [email or web address].

## 2018-11-08 ENCOUNTER — Encounter: Payer: Self-pay | Admitting: Pharmacist

## 2018-11-08 ENCOUNTER — Other Ambulatory Visit: Payer: Self-pay | Admitting: Pharmacist

## 2018-11-08 DIAGNOSIS — Z7901 Long term (current) use of anticoagulants: Secondary | ICD-10-CM

## 2018-11-08 DIAGNOSIS — I2699 Other pulmonary embolism without acute cor pulmonale: Secondary | ICD-10-CM

## 2018-11-10 ENCOUNTER — Other Ambulatory Visit: Payer: Self-pay | Admitting: *Deleted

## 2018-11-10 DIAGNOSIS — I2699 Other pulmonary embolism without acute cor pulmonale: Secondary | ICD-10-CM

## 2018-11-10 DIAGNOSIS — Z7901 Long term (current) use of anticoagulants: Secondary | ICD-10-CM

## 2018-11-23 ENCOUNTER — Other Ambulatory Visit: Payer: Self-pay

## 2018-11-23 DIAGNOSIS — R768 Other specified abnormal immunological findings in serum: Secondary | ICD-10-CM

## 2018-11-23 NOTE — Telephone Encounter (Signed)
Please call pt back about meds. Please call pt back. 

## 2018-11-23 NOTE — Telephone Encounter (Signed)
Pt calls and states he is "feeling an outbreak of herpes coming on at this minute" wants a refill of valtrex. Please advise, you may call him at [phone].

## 2018-11-24 MED ORDER — VALACYCLOVIR HCL 500 MG PO TABS: 500.0000 mg | ORAL_TABLET | Freq: Two times a day (BID) | ORAL | 3 refills | Status: DC

## 2018-11-24 NOTE — Telephone Encounter (Signed)
Script for Valacyclovir 500mg  BID for three days with refills sent to the [facility] in [place].

## 2018-12-18 DIAGNOSIS — I2699 Other pulmonary embolism without acute cor pulmonale: Secondary | ICD-10-CM | POA: Diagnosis not present

## 2018-12-18 DIAGNOSIS — Z7901 Long term (current) use of anticoagulants: Secondary | ICD-10-CM | POA: Diagnosis not present

## 2018-12-19 ENCOUNTER — Telehealth: Payer: Self-pay | Admitting: Pharmacist

## 2018-12-19 ENCOUNTER — Other Ambulatory Visit: Payer: Self-pay | Admitting: *Deleted

## 2018-12-19 DIAGNOSIS — I2699 Other pulmonary embolism without acute cor pulmonale: Secondary | ICD-10-CM

## 2018-12-19 DIAGNOSIS — Z7901 Long term (current) use of anticoagulants: Secondary | ICD-10-CM

## 2018-12-19 LAB — PROTIME-INR
INR: 4.2 — ABNORMAL HIGH (ref 0.8–1.2)
PROTHROMBIN TIME: 41.3 s — AB (ref 9.1–12.0)

## 2018-12-19 NOTE — Telephone Encounter (Signed)
Left VM: INR = 4.2 Instructed to OMIT dose of warfarin x 1; resume following day. Take 5mg  on M/Th; 7.5mg  all other days. Repeat INR week of 15-Jan-2019 at [facility].

## 2019-01-16 DIAGNOSIS — Z7901 Long term (current) use of anticoagulants: Secondary | ICD-10-CM | POA: Diagnosis not present

## 2019-01-16 DIAGNOSIS — I2699 Other pulmonary embolism without acute cor pulmonale: Secondary | ICD-10-CM | POA: Diagnosis not present

## 2019-01-17 ENCOUNTER — Telehealth: Payer: Self-pay | Admitting: Pharmacist

## 2019-01-17 ENCOUNTER — Other Ambulatory Visit: Payer: Self-pay | Admitting: *Deleted

## 2019-01-17 DIAGNOSIS — Z7901 Long term (current) use of anticoagulants: Secondary | ICD-10-CM

## 2019-01-17 DIAGNOSIS — I2699 Other pulmonary embolism without acute cor pulmonale: Secondary | ICD-10-CM

## 2019-01-17 LAB — PROTIME-INR
INR: 2.9 — ABNORMAL HIGH (ref 0.8–1.2)
Prothrombin Time: 28.7 s — ABNORMAL HIGH (ref 9.1–12.0)

## 2019-01-17 NOTE — Telephone Encounter (Signed)
Left message:  DECREASE from 47.5mg  warfarin/wk to 45mg  warfarin/wk (1x5mg  M/W/F; 1 and 1/2 x 5mg  [7.5mg ] on Su/Tu/Th/Sa), Repeat INR 12-Feb-2019 at [facility].

## 2019-02-06 ENCOUNTER — Telehealth: Payer: Self-pay | Admitting: Pharmacist

## 2019-02-06 NOTE — Telephone Encounter (Signed)
Advised patient to go to [facility] on Wednesday 14-Feb-2019 OR on Monday 19-Feb-2019 (NOT on Monday 12-Feb-2019 as I am OOT on PAL).  

## 2019-02-06 NOTE — Telephone Encounter (Signed)
Advised patient to go to [facility] on Wednesday 14-Feb-2019 OR on Monday 19-Feb-2019 (NOT on Monday 12-Feb-2019 as I am OOT on PAL).

## 2019-02-12 DIAGNOSIS — Z7901 Long term (current) use of anticoagulants: Secondary | ICD-10-CM | POA: Diagnosis not present

## 2019-02-12 DIAGNOSIS — I2699 Other pulmonary embolism without acute cor pulmonale: Secondary | ICD-10-CM | POA: Diagnosis not present

## 2019-02-13 ENCOUNTER — Other Ambulatory Visit: Payer: Self-pay | Admitting: *Deleted

## 2019-02-13 ENCOUNTER — Telehealth: Payer: Self-pay | Admitting: Internal Medicine

## 2019-02-13 DIAGNOSIS — I2699 Other pulmonary embolism without acute cor pulmonale: Secondary | ICD-10-CM

## 2019-02-13 DIAGNOSIS — Z7901 Long term (current) use of anticoagulants: Secondary | ICD-10-CM

## 2019-02-13 LAB — PROTIME-INR
INR: 1.2 (ref 0.8–1.2)
Prothrombin Time: 12.8 s — ABNORMAL HIGH (ref 9.1–12.0)

## 2019-02-13 MED ORDER — WARFARIN SODIUM 5 MG PO TABS: ORAL_TABLET | ORAL | 0 refills | Status: DC

## 2019-02-13 NOTE — Telephone Encounter (Signed)
Received call that patient was out of Warfarin and did not have a dose for the following day. Per chart review his last recommended dosing regimen was 1x5mg  M/W/F; 1 and 1/2 x 5mg  [7.5mg ] on Su/Tu/Th/Sa. He was to obtain an INR per the note from 01/17/2019 on June 22. His INR at that time was 1.2 but this appears to have been mistimed given his dosing changes. I will continue the current regimen with a short 15 tablet refill.   He will need to follow-up with Dr. Elie Confer for an INR.  Kathi Ludwig, MD [facility] Internal Medicine, PGY-2

## 2019-02-13 NOTE — Telephone Encounter (Signed)
Recived INR report of 1.2. Appears patient obtained INR check earlier than instructed by Dr Elie Confer who is currently out.  From chart review He had an elevated INR and most recently his coumadin dose was changed from 47.5mg /week down to 45mg /wk.  I attempted to call to inquirer about actual dosing before any dose change is made.  Additionally I suspect Dr Elie Confer will be back tomorrow if he does not call back today.

## 2019-02-14 NOTE — Telephone Encounter (Signed)
I will refill his prescription and call him to recommence 1 and 1/2 x 5mg  (7.5mg ) on Su/Tu/Th/Sa; 1x5mg  all other days; Repeat INR when back at "steady state" after a temporary absence due to having ran-out of warfarin.

## 2019-02-16 ENCOUNTER — Other Ambulatory Visit: Payer: Self-pay | Admitting: *Deleted

## 2019-02-16 DIAGNOSIS — Z7901 Long term (current) use of anticoagulants: Secondary | ICD-10-CM

## 2019-02-16 DIAGNOSIS — I2699 Other pulmonary embolism without acute cor pulmonale: Secondary | ICD-10-CM

## 2019-02-19 ENCOUNTER — Telehealth: Payer: Self-pay

## 2019-02-19 NOTE — Telephone Encounter (Signed)
warfarin (COUMADIN) 5 MG tablet   Refill request @  [place], [place] [phone] (Phone) [phone] (Fax)

## 2019-02-19 NOTE — Telephone Encounter (Signed)
Requesting to speak with you about INR test. Please call pt back.

## 2019-02-19 NOTE — Telephone Encounter (Signed)
Patient should speak to Dr. Elie Confer.

## 2019-02-20 ENCOUNTER — Other Ambulatory Visit: Payer: Self-pay | Admitting: Pharmacist

## 2019-02-20 DIAGNOSIS — Z7901 Long term (current) use of anticoagulants: Secondary | ICD-10-CM

## 2019-02-20 DIAGNOSIS — I2699 Other pulmonary embolism without acute cor pulmonale: Secondary | ICD-10-CM

## 2019-02-20 DIAGNOSIS — Z20828 Contact with and (suspected) exposure to other viral communicable diseases: Secondary | ICD-10-CM | POA: Diagnosis not present

## 2019-02-20 MED ORDER — WARFARIN SODIUM 5 MG PO TABS: ORAL_TABLET | ORAL | 2 refills | Status: DC

## 2019-02-22 ENCOUNTER — Other Ambulatory Visit: Payer: Self-pay | Admitting: Pharmacist

## 2019-02-22 ENCOUNTER — Telehealth: Payer: Self-pay | Admitting: *Deleted

## 2019-02-22 DIAGNOSIS — I2699 Other pulmonary embolism without acute cor pulmonale: Secondary | ICD-10-CM

## 2019-02-22 DIAGNOSIS — Z7901 Long term (current) use of anticoagulants: Secondary | ICD-10-CM

## 2019-02-22 MED ORDER — WARFARIN SODIUM 5 MG PO TABS: ORAL_TABLET | ORAL | 2 refills | Status: DC

## 2019-02-22 NOTE — Telephone Encounter (Signed)
Called patient. Refill has been electronically sent to his pharmacy. Low INR 1.20 on 14-Feb-2019 is attributed to his having been off/out of warfarin for 3-4 days. Was provided instructions to recommence at this 45mg  warfarin/wk (5mg  MWF; 7.5mg  all other days)--re-establish "steady-state" ON THERAPY, and then re-evaluate dose response. We discussed him coming to clinic a week earlier (12 days back on warfarin vs. 19). He agrees and will come at [facility] on Monday 26-Feb-2019.

## 2019-02-26 ENCOUNTER — Other Ambulatory Visit: Payer: Self-pay

## 2019-02-26 ENCOUNTER — Ambulatory Visit (INDEPENDENT_AMBULATORY_CARE_PROVIDER_SITE_OTHER): Payer: BC Managed Care – PPO | Admitting: Pharmacist

## 2019-02-26 DIAGNOSIS — Z7901 Long term (current) use of anticoagulants: Secondary | ICD-10-CM

## 2019-02-26 DIAGNOSIS — Z5181 Encounter for therapeutic drug level monitoring: Secondary | ICD-10-CM

## 2019-02-26 DIAGNOSIS — I2699 Other pulmonary embolism without acute cor pulmonale: Secondary | ICD-10-CM | POA: Diagnosis not present

## 2019-02-26 DIAGNOSIS — I80299 Phlebitis and thrombophlebitis of other deep vessels of unspecified lower extremity: Secondary | ICD-10-CM | POA: Diagnosis not present

## 2019-02-26 LAB — POCT INR: INR: 3.4 — AB (ref 2.0–3.0)

## 2019-02-26 NOTE — Progress Notes (Signed)
Anticoagulation Management Richard [name] is a 43 y.o. male who reports to the clinic for monitoring of warfarin treatment.    Indication: PE, History of; Long term current use of anticoagulant.   Duration: indefinite Supervising physician: Earl LagosNischal Narendra  Anticoagulation Clinic Visit History: Patient does not report signs/symptoms of bleeding or thromboembolism  Other recent changes: No diet, medications, lifestyle changes endorsed.  Anticoagulation Episode Summary    Current INR goal:  2.0-3.0  TTR:  52.2 % (6.8 y)  Next INR check:  03/28/2019  INR from last check:  3.4 (02/26/2019)  Weekly max warfarin dose:    Target end date:  Indefinite  INR check location:  Anticoagulation Clinic  Preferred lab:    Send INR reminders to:     Indications   Pulmonary embolism and infarction (HCC) [I26.99] D V T [I80.299] Long term (current) use of anticoagulants [Z79.01]       Comments:          No Known Allergies  Current Outpatient Medications:  .  cetirizine (ZYRTEC) 10 MG tablet, Take 1 tablet (10 mg total) by mouth daily., Disp: 100 tablet, Rfl: 0 .  valACYclovir (VALTREX) 500 MG tablet, Take 1 tablet (500 mg total) by mouth 2 (two) times daily. For three days., Disp: 6 tablet, Rfl: 3 .  warfarin (COUMADIN) 5 MG tablet, Take 1 x 5mg  tablet M/W/F and 1.5 x 5mg  [7.5mg ] on Su/Tu/Th/Sa, Disp: 36 tablet, Rfl: 2 Past Medical History:  Diagnosis Date  . DVT (deep venous thrombosis) (HCC)   . Seasonal allergies    Social History   Socioeconomic History  . Marital status: Single    Spouse name: Not on file  . Number of children: Not on file  . Years of education: Not on file  . Highest education level: Not on file  Occupational History  . Not on file  Social Needs  . Financial resource strain: Not on file  . Food insecurity    Worry: Not on file    Inability: Not on file  . Transportation needs    Medical: Not on file    Non-medical: Not on file  Tobacco Use  . Smoking  status: Former Smoker    Years: 1.00    Types: Cigarettes, Cigars    Quit date: 04/05/2012    Years since quitting: 6.8  Substance and Sexual Activity  . Alcohol use: No    Alcohol/week: 0.0 standard drinks  . Drug use: No  . Sexual activity: Not on file  Lifestyle  . Physical activity    Days per week: Not on file    Minutes per session: Not on file  . Stress: Not on file  Relationships  . Social Musicianconnections    Talks on phone: Not on file    Gets together: Not on file    Attends religious service: Not on file    Active member of club or organization: Not on file    Attends meetings of clubs or organizations: Not on file    Relationship status: Not on file  Other Topics Concern  . Not on file  Social History Narrative  . Not on file   Family History  Problem Relation Age of Onset  . Other Mother   . Other Father   . Pulmonary embolism Maternal Uncle     ASSESSMENT Recent Results: The most recent result is correlated with 45 mg per week: Lab Results  Component Value Date   INR 3.4 (A) 02/26/2019  INR 1.2 02/12/2019   INR 2.9 (H) 01/16/2019   PROTIME 20.4 (H) 03/16/2011    Anticoagulation Dosing: Description   Take one tablet of your 5mg  peach-colored warfarin tablets on Mondays, Tuesdays, Wednesdays and Fridays; all other days (Thursdays, Saturdays and Sundays), take 1 & 1/2 of your 5mg  peach-colored warfarin tablets.      INR today: Supratherapeutic  PLAN Weekly dose was decreased by 5% to 42.5 mg per week  Patient Instructions  Patient instructed to take medications as defined in the Anti-coagulation Track section of this encounter.  Patient instructed to take today's dose.  Patient instructed to take one tablet of your 5mg  peach-colored warfarin tablets on Mondays, Tuesdays, Wednesdays and Fridays; all other days (Thursdays, Saturdays and Sundays), take 1 & 1/2 of your 5mg  peach-colored warfarin tablets.  Patient verbalized understanding of these  instructions.     Patient advised to contact clinic or seek medical attention if signs/symptoms of bleeding or thromboembolism occur.  Patient verbalized understanding by repeating back information and was advised to contact me if further medication-related questions arise. Patient was also provided an information handout.  Follow-up Return in 30 days (on 03/28/2019) for Follow up INR.  [name], PharmD, CPP  15 minutes spent face-to-face with the patient during the encounter. 50% of time spent on education, including signs/sx bleeding and clotting, as well as food and drug interactions with warfarin. 50% of time was spent on fingerprick POC INR sample collection,processing, results determination, and documentation in [email or web address].kim.net/.

## 2019-02-26 NOTE — Patient Instructions (Signed)
Patient instructed to take medications as defined in the Anti-coagulation Track section of this encounter.  Patient instructed to take today's dose.  Patient instructed to take one tablet of your 5mg  peach-colored warfarin tablets on Mondays, Tuesdays, Wednesdays and Fridays; all other days (Thursdays, Saturdays and Sundays), take 1 & 1/2 of your 5mg  peach-colored warfarin tablets.  Patient verbalized understanding of these instructions.

## 2019-02-26 NOTE — Progress Notes (Signed)
INTERNAL MEDICINE TEACHING ATTENDING ADDENDUM - Shaan Rhoads M.D  Duration- indefinite, Indication- PE, DVT, INR- supra therapeutic. Agree with pharmacy recommendations as outlined in their note.      

## 2019-03-27 NOTE — Progress Notes (Signed)
   CC: Follow up for pulmonary embolism   HPI:Mr.Lindwood Mogel is a 43 y.o. male who presents for evaluation of history of recurrent pulmonary embolisms. Please see individual problem based A/P for details.  Depression, PHQ-9: Based on the patients    Office Visit from 03/28/2019 in [facility]  PHQ-9 Total Score  0     score we have decided to monitor.  Past Medical History:  Diagnosis Date  . DVT (deep venous thrombosis) ([place])   . Seasonal allergies    Review of Systems:  ROS negative except as per HPI.  Physical Exam: Vitals:   03/28/19 1514  BP: 109/60  Pulse: 78  Temp: 99.4 F (37.4 C)  TempSrc: Oral  SpO2: 97%  Weight: 202 lb 12.8 oz (92 kg)  Height: 6' (1.829 m)   General: A/O x4, in no acute distress, afebrile, nondiaphoretic HEENT: PEERL, EMO intact Cardio: RRR, no mrg's  Pulmonary: CTA bilaterally, no wheezing or crackles  Abdomen: Bowel sounds normal, soft, nontender  MSK: BLE nontender, nonedematous Psych: Appropriate affect, not depressed in appearance, engages well  Assessment & Plan:   See Encounters Tab for problem based charting.  Patient discussed with Dr. Evette Doffing

## 2019-03-28 ENCOUNTER — Ambulatory Visit (INDEPENDENT_AMBULATORY_CARE_PROVIDER_SITE_OTHER): Payer: BC Managed Care – PPO | Admitting: Internal Medicine

## 2019-03-28 ENCOUNTER — Encounter: Payer: Self-pay | Admitting: Internal Medicine

## 2019-03-28 ENCOUNTER — Other Ambulatory Visit: Payer: Self-pay

## 2019-03-28 ENCOUNTER — Ambulatory Visit: Payer: BC Managed Care – PPO | Admitting: Pharmacist

## 2019-03-28 VITALS — BP 109/60 | HR 78 | Temp 99.4°F | Ht 72.0 in | Wt 202.8 lb

## 2019-03-28 DIAGNOSIS — I2699 Other pulmonary embolism without acute cor pulmonale: Secondary | ICD-10-CM | POA: Diagnosis not present

## 2019-03-28 DIAGNOSIS — Z86711 Personal history of pulmonary embolism: Secondary | ICD-10-CM | POA: Diagnosis not present

## 2019-03-28 DIAGNOSIS — Z7901 Long term (current) use of anticoagulants: Secondary | ICD-10-CM

## 2019-03-28 DIAGNOSIS — Z202 Contact with and (suspected) exposure to infections with a predominantly sexual mode of transmission: Secondary | ICD-10-CM

## 2019-03-28 DIAGNOSIS — Z5181 Encounter for therapeutic drug level monitoring: Secondary | ICD-10-CM

## 2019-03-28 LAB — POCT INR: INR: 4.2 — AB (ref 2.0–3.0)

## 2019-03-28 NOTE — Progress Notes (Signed)
Anticoagulation Management Richard [name] is a 43 y.o. male who reports to the clinic for monitoring of warfarin treatment.    Indication: PE , History of, with infarction; Long term current use of anticoagulant; History of DVT.   Duration: indefinite Supervising physician: Earl LagosNischal Narendra  Anticoagulation Clinic Visit History: Patient does not report signs/symptoms of bleeding or thromboembolism No Other recent changes: No diet, medications, lifestyle changes endorsed.  Anticoagulation Episode Summary    Current INR goal:  2.0-3.0  TTR:  51.6 % (6.9 y)  Next INR check:  04/23/2019  INR from last check:  4.2 (03/28/2019)  Weekly max warfarin dose:    Target end date:  Indefinite  INR check location:  Anticoagulation Clinic  Preferred lab:    Send INR reminders to:     Indications   Pulmonary embolism and infarction (HCC) [I26.99] D V T [I80.299] Long term (current) use of anticoagulants [Z79.01]       Comments:          No Known Allergies  Current Outpatient Medications:  .  cetirizine (ZYRTEC) 10 MG tablet, Take 1 tablet (10 mg total) by mouth daily., Disp: 100 tablet, Rfl: 0 .  valACYclovir (VALTREX) 500 MG tablet, Take 1 tablet (500 mg total) by mouth 2 (two) times daily. For three days., Disp: 6 tablet, Rfl: 3 .  warfarin (COUMADIN) 5 MG tablet, Take 1 x 5mg  tablet M/W/F and 1.5 x 5mg  [7.5mg ] on Su/Tu/Th/Sa, Disp: 36 tablet, Rfl: 2 Past Medical History:  Diagnosis Date  . DVT (deep venous thrombosis) (HCC)   . Seasonal allergies    Social History   Socioeconomic History  . Marital status: Single    Spouse name: Not on file  . Number of children: Not on file  . Years of education: Not on file  . Highest education level: Not on file  Occupational History  . Not on file  Social Needs  . Financial resource strain: Not on file  . Food insecurity    Worry: Not on file    Inability: Not on file  . Transportation needs    Medical: Not on file    Non-medical: Not  on file  Tobacco Use  . Smoking status: Former Smoker    Years: 1.00    Types: Cigarettes, Cigars    Quit date: 04/05/2012    Years since quitting: 6.9  . Smokeless tobacco: Never Used  Substance and Sexual Activity  . Alcohol use: No    Alcohol/week: 0.0 standard drinks  . Drug use: No  . Sexual activity: Not on file  Lifestyle  . Physical activity    Days per week: Not on file    Minutes per session: Not on file  . Stress: Not on file  Relationships  . Social Musicianconnections    Talks on phone: Not on file    Gets together: Not on file    Attends religious service: Not on file    Active member of club or organization: Not on file    Attends meetings of clubs or organizations: Not on file    Relationship status: Not on file  Other Topics Concern  . Not on file  Social History Narrative  . Not on file   Family History  Problem Relation Age of Onset  . Other Mother   . Other Father   . Pulmonary embolism Maternal Uncle     ASSESSMENT Recent Results: The most recent result is correlated with 42.5 mg per week: Lab Results  Component Value Date   INR 4.2 (A) 03/28/2019   INR 3.4 (A) 02/26/2019   INR 1.2 02/12/2019   PROTIME 20.4 (H) 03/16/2011    Anticoagulation Dosing: Description   Take one tablet of your 5mg  peach-colored warfarin tablets DAILY, except on Sundays, take 1 & 1/2 of your 5mg  peach-colored warfarin tablets each Sunday.       INR today: Supratherapeutic  PLAN Weekly dose was decreased by 12% to 37.5 mg per week  Patient Instructions  Patient instructed to take medications as defined in the Anti-coagulation Track section of this encounter.  Patient instructed to take today's dose.  Patient instructed to take one tablet of your 5mg  peach-colored warfarin tablets DAILY, except on Sundays, take 1 & 1/2 of your 5mg  peach-colored warfarin tablets each Sunday.  Patient verbalized understanding of these instructions.     Patient advised to contact clinic  or seek medical attention if signs/symptoms of bleeding or thromboembolism occur.  Patient verbalized understanding by repeating back information and was advised to contact me if further medication-related questions arise. Patient was also provided an information handout.  Follow-up Return in 26 days (on 04/23/2019) for Follow up INR.  [name], PharmD, CPP  15 minutes spent face-to-face with the patient during the encounter. 50% of time spent on education, including signs/sx bleeding and clotting, as well as food and drug interactions with warfarin. 50% of time was spent on fingerprick POC INR sample collection,processing, results determination, and documentation in [email or web address].kim.net/.

## 2019-03-28 NOTE — Patient Instructions (Signed)
Patient instructed to take medications as defined in the Anti-coagulation Track section of this encounter.  Patient instructed to take today's dose.  Patient instructed to take one tablet of your 5mg  peach-colored warfarin tablets DAILY, except on Sundays, take 1 & 1/2 of your 5mg  peach-colored warfarin tablets each Sunday.  Patient verbalized understanding of these instructions.

## 2019-03-28 NOTE — Patient Instructions (Signed)
FOLLOW-UP INSTRUCTIONS When: 6 months For: Routine visit What to bring: All of your medications  I have not made any changes to your medications today. We did discuss starting Xarelto as this is much easier to dose and take.  Today we discussed your need for continued treatment of recurrent pulmonary embolisms. I will notify you of the results of any labs from today's evaluation when available to me.   Thank you for your visit to the [facility] today. If you have any questions or concerns please call us at [phone].

## 2019-03-29 ENCOUNTER — Encounter: Payer: Self-pay | Admitting: Internal Medicine

## 2019-03-29 ENCOUNTER — Ambulatory Visit: Payer: BC Managed Care – PPO

## 2019-03-29 LAB — BMP8+ANION GAP
Anion Gap: 15 mmol/L (ref 10.0–18.0)
BUN/Creatinine Ratio: 21 — ABNORMAL HIGH (ref 9–20)
BUN: 26 mg/dL — ABNORMAL HIGH (ref 6–24)
CO2: 21 mmol/L (ref 20–29)
Calcium: 9.6 mg/dL (ref 8.7–10.2)
Chloride: 106 mmol/L (ref 96–106)
Creatinine, Ser: 1.24 mg/dL (ref 0.76–1.27)
GFR calc Af Amer: 82 mL/min/{1.73_m2} (ref >59)
GFR calc non Af Amer: 71 mL/min/{1.73_m2} (ref >59)
Glucose: 88 mg/dL (ref 65–99)
Potassium: 4.8 mmol/L (ref 3.5–5.2)
Sodium: 142 mmol/L (ref 134–144)

## 2019-03-29 LAB — HIV ANTIBODY (ROUTINE TESTING W REFLEX): HIV Screen 4th Generation wRfx: NONREACTIVE

## 2019-03-29 NOTE — Assessment & Plan Note (Addendum)
Hx of PE, on long term warfarin: The patient experienced recurrent unprovoked PE/DVT's in 2012 and 2013. Since that time he has followed closely with the internal medicine center's Dr. Elie Confer for his anticoagulation therapy with warfarin. Today we discussed the potential change to Xarelto 15mg  once daily which would provide a better quality of life and may be slightly inferior with regard to lower bleeding risk. He would like to think this over and discuss it with Dr. Elie Confer.   Plan: Continue Coumadin, INR ordered today, Dr. Elie Confer to provide warfarin plan after POC INR is completed. Patient will consider Xarelto therapy after discussing this with Dr. Elie Confer

## 2019-03-29 NOTE — Assessment & Plan Note (Signed)
  STD exposure: Concern for HIV exposure. Patient did not wish to elaborate further. Asymptomatic. Exposure type not revealed.   Plan:  Ordered HIV screening lab

## 2019-03-29 NOTE — Progress Notes (Signed)
Internal Medicine Clinic Attending  Case discussed with Dr. Harbrecht at the time of the visit.  We reviewed the resident's history and exam and pertinent patient test results.  I agree with the assessment, diagnosis, and plan of care documented in the resident's note.   

## 2019-03-30 DIAGNOSIS — Z87891 Personal history of nicotine dependence: Secondary | ICD-10-CM | POA: Diagnosis not present

## 2019-03-30 DIAGNOSIS — R072 Precordial pain: Secondary | ICD-10-CM | POA: Diagnosis not present

## 2019-03-30 DIAGNOSIS — R079 Chest pain, unspecified: Secondary | ICD-10-CM | POA: Diagnosis not present

## 2019-03-30 NOTE — Progress Notes (Signed)
INTERNAL MEDICINE TEACHING ATTENDING ADDENDUM - Donnamaria Shands M.D  Duration-indefinite, Indication- recurrent PE, DVT, INR- supratherapeutic INR. Agree with pharmacy recommendations as outlined in their note.

## 2019-04-23 ENCOUNTER — Telehealth: Payer: Self-pay | Admitting: *Deleted

## 2019-04-23 ENCOUNTER — Other Ambulatory Visit: Payer: Self-pay

## 2019-04-23 ENCOUNTER — Ambulatory Visit (INDEPENDENT_AMBULATORY_CARE_PROVIDER_SITE_OTHER): Payer: BC Managed Care – PPO | Admitting: Pharmacist

## 2019-04-23 DIAGNOSIS — Z7901 Long term (current) use of anticoagulants: Secondary | ICD-10-CM | POA: Diagnosis not present

## 2019-04-23 DIAGNOSIS — Z86718 Personal history of other venous thrombosis and embolism: Secondary | ICD-10-CM

## 2019-04-23 DIAGNOSIS — Z5181 Encounter for therapeutic drug level monitoring: Secondary | ICD-10-CM

## 2019-04-23 DIAGNOSIS — Z86711 Personal history of pulmonary embolism: Secondary | ICD-10-CM | POA: Diagnosis not present

## 2019-04-23 DIAGNOSIS — I2699 Other pulmonary embolism without acute cor pulmonale: Secondary | ICD-10-CM

## 2019-04-23 LAB — POCT INR: INR: 1.9 — AB (ref 2.0–3.0)

## 2019-04-23 MED ORDER — WARFARIN SODIUM 5 MG PO TABS: ORAL_TABLET | ORAL | 2 refills | Status: DC

## 2019-04-23 NOTE — Telephone Encounter (Signed)
Spoke with patient.  Reported a negative test.  Warned him of possibility for window period and that he could get re-tested in 6 months.  He expressed understanding.

## 2019-04-23 NOTE — Progress Notes (Signed)
Anticoagulation Management Richard [name] is a 43 y.o. male who reports to the clinic for monitoring of warfarin treatment.    Indication: PE, History of (resolved); DVT, History of (resolved); Long term current use of anticoagulant.   Duration: indefinite Supervising physician: [place] [facility] Visit History: Patient does not report signs/symptoms of bleeding or thromboembolism  Other recent changes: No diet, medications, lifestyle changes endorsed.  Anticoagulation Episode Summary    Current INR goal:  2.0-3.0  TTR:  51.5 % (7 y)  Next INR check:  05/21/2019  INR from last check:  1.9 (04/23/2019)  Weekly max warfarin dose:    Target end date:  Indefinite  INR check location:  Anticoagulation Clinic  Preferred lab:    Send INR reminders to:     Indications   Pulmonary embolism and infarction ([place]) [I26.99] D V T [I80.299] Long term (current) use of anticoagulants [Z79.01]       Comments:          No Known Allergies  Current Outpatient Medications:  .  cetirizine (ZYRTEC) 10 MG tablet, Take 1 tablet (10 mg total) by mouth daily., Disp: 100 tablet, Rfl: 0 .  valACYclovir (VALTREX) 500 MG tablet, Take 1 tablet (500 mg total) by mouth 2 (two) times daily. For three days., Disp: 6 tablet, Rfl: 3 .  warfarin (COUMADIN) 5 MG tablet, Take one (1) tablet all days of week--EXCEPT on Mondays and Thursdays, take 1 and 1/2 tablets., Disp: 32 tablet, Rfl: 2 Past Medical History:  Diagnosis Date  . DVT (deep venous thrombosis) ([place])   . Seasonal allergies    Social History   Socioeconomic History  . Marital status: Single    Spouse name: Not on file  . Number of children: Not on file  . Years of education: Not on file  . Highest education level: Not on file  Occupational History  . Not on file  Social Needs  . Financial resource strain: Not on file  . Food insecurity    Worry: Not on file    Inability: Not on file  . Transportation needs    Medical: Not  on file    Non-medical: Not on file  Tobacco Use  . Smoking status: Former Smoker    Years: 1.00    Types: Cigarettes, Cigars    Quit date: 04/05/2012    Years since quitting: 7.0  . Smokeless tobacco: Never Used  Substance and Sexual Activity  . Alcohol use: No    Alcohol/week: 0.0 standard drinks  . Drug use: No  . Sexual activity: Not on file  Lifestyle  . Physical activity    Days per week: Not on file    Minutes per session: Not on file  . Stress: Not on file  Relationships  . Social Herbalist on phone: Not on file    Gets together: Not on file    Attends religious service: Not on file    Active member of club or organization: Not on file    Attends meetings of clubs or organizations: Not on file    Relationship status: Not on file  Other Topics Concern  . Not on file  Social History Narrative  . Not on file   Family History  Problem Relation Age of Onset  . Other Mother   . Other Father   . Pulmonary embolism Maternal Uncle     ASSESSMENT Recent Results: The most recent result is correlated with 37.5 mg per  week: Lab Results  Component Value Date   INR 1.9 (A) 04/23/2019   INR 4.2 (A) 03/28/2019   INR 3.4 (A) 02/26/2019   PROTIME 20.4 (H) 03/16/2011    Anticoagulation Dosing: Description   Take one tablet of your 5mg  peach-colored warfarin tablets DAILY, except on Mondays and Thursdays, take 1 & 1/2 of your 5mg  peach-colored warfarin tablets each Monday and Thursday.      INR today: Subtherapeutic  PLAN Weekly dose was increased by 7% to 40 mg per week  Patient Instructions  Patient instructed to take medications as defined in the Anti-coagulation Track section of this encounter.  Patient instructed to take today's dose.  Patient instructed to take one tablet of your 5mg  peach-colored warfarin tablets DAILY, except on Mondays and Thursdays, take 1 & 1/2 of your 5mg  peach-colored warfarin tablets each Monday and Thursday.  Patient  verbalized understanding of these instructions.     Patient advised to contact clinic or seek medical attention if signs/symptoms of bleeding or thromboembolism occur.  Patient verbalized understanding by repeating back information and was advised to contact me if further medication-related questions arise. Patient was also provided an information handout.  Follow-up Return in 4 weeks (on 05/21/2019) for Follow up INR.  Elicia LampJames B Groce, PharmD, CPP  15 minutes spent face-to-face with the patient during the encounter. 50% of time spent on education, including signs/sx bleeding and clotting, as well as food and drug interactions with warfarin. 50% of time was spent on fingerprick POC INR sample collection,processing, results determination, and documentation in [email or web address].

## 2019-04-23 NOTE — Patient Instructions (Signed)
Patient instructed to take medications as defined in the Anti-coagulation Track section of this encounter.  Patient instructed to take today's dose.  Patient instructed to take one tablet of your 5mg  peach-colored warfarin tablets DAILY, except on Mondays and Thursdays, take 1 & 1/2 of your 5mg  peach-colored warfarin tablets each Monday and Thursday.  Patient verbalized understanding of these instructions.

## 2019-04-23 NOTE — Telephone Encounter (Signed)
Pt's here for coumadin appt - requesting test result (HIV). Talked to Dr Daryll Drown, attending, who will talk to pt.

## 2019-04-25 NOTE — Progress Notes (Signed)
I reviewed Dr. Gladstone Pih note.  Patient is on [facility] for VTE.  INR was low and dose was increased.

## 2019-05-21 ENCOUNTER — Ambulatory Visit (INDEPENDENT_AMBULATORY_CARE_PROVIDER_SITE_OTHER): Payer: BC Managed Care – PPO | Admitting: Pharmacist

## 2019-05-21 ENCOUNTER — Encounter (INDEPENDENT_AMBULATORY_CARE_PROVIDER_SITE_OTHER): Payer: Self-pay

## 2019-05-21 ENCOUNTER — Other Ambulatory Visit: Payer: Self-pay

## 2019-05-21 DIAGNOSIS — Z5181 Encounter for therapeutic drug level monitoring: Secondary | ICD-10-CM

## 2019-05-21 DIAGNOSIS — Z7901 Long term (current) use of anticoagulants: Secondary | ICD-10-CM | POA: Diagnosis not present

## 2019-05-21 DIAGNOSIS — Z86718 Personal history of other venous thrombosis and embolism: Secondary | ICD-10-CM

## 2019-05-21 DIAGNOSIS — Z86711 Personal history of pulmonary embolism: Secondary | ICD-10-CM

## 2019-05-21 DIAGNOSIS — I2699 Other pulmonary embolism without acute cor pulmonale: Secondary | ICD-10-CM

## 2019-05-21 LAB — POCT INR: INR: 2.3 (ref 2.0–3.0)

## 2019-05-21 MED ORDER — WARFARIN SODIUM 5 MG PO TABS: ORAL_TABLET | ORAL | 2 refills | Status: DC

## 2019-05-21 NOTE — Progress Notes (Signed)
Anticoagulation Management Richard [name] is a 43 y.o. male who reports to the clinic for monitoring of warfarin treatment.    Indication: PE with infarction, history of; DVT, history of; Long term current use of anticoagulant.   Duration: indefinite Supervising physician: [place] [facility] Visit History: Patient does not report signs/symptoms of bleeding or thromboembolism  Other recent changes: No diet, medications, lifestyle changes endorsed at this visit by the patient.  Anticoagulation Episode Summary    Current INR goal:  2.0-3.0  TTR:  51.8 % (7.1 y)  Next INR check:  06/18/2019  INR from last check:  2.3 (05/21/2019)  Weekly max warfarin dose:    Target end date:  Indefinite  INR check location:  Anticoagulation Clinic  Preferred lab:    Send INR reminders to:     Indications   Pulmonary embolism and infarction ([place]) [I26.99] D V T [I80.299] Long term (current) use of anticoagulants [Z79.01]       Comments:          No Known Allergies  Current Outpatient Medications:  .  valACYclovir (VALTREX) 500 MG tablet, Take 1 tablet (500 mg total) by mouth 2 (two) times daily. For three days., Disp: 6 tablet, Rfl: 3 .  warfarin (COUMADIN) 5 MG tablet, Take one (1) tablet all days of week--EXCEPT on Mondays and Thursdays, take 1 and 1/2 tablets., Disp: 32 tablet, Rfl: 2 .  cetirizine (ZYRTEC) 10 MG tablet, Take 1 tablet (10 mg total) by mouth daily. (Patient not taking: Reported on 05/21/2019), Disp: 100 tablet, Rfl: 0 Past Medical History:  Diagnosis Date  . DVT (deep venous thrombosis) ([place])   . Seasonal allergies    Social History   Socioeconomic History  . Marital status: Single    Spouse name: Not on file  . Number of children: Not on file  . Years of education: Not on file  . Highest education level: Not on file  Occupational History  . Not on file  Social Needs  . Financial resource strain: Not on file  . Food insecurity    Worry: Not  on file    Inability: Not on file  . Transportation needs    Medical: Not on file    Non-medical: Not on file  Tobacco Use  . Smoking status: Former Smoker    Years: 1.00    Types: Cigarettes, Cigars    Quit date: 04/05/2012    Years since quitting: 7.1  . Smokeless tobacco: Never Used  Substance and Sexual Activity  . Alcohol use: No    Alcohol/week: 0.0 standard drinks  . Drug use: No  . Sexual activity: Not on file  Lifestyle  . Physical activity    Days per week: Not on file    Minutes per session: Not on file  . Stress: Not on file  Relationships  . Social Herbalist on phone: Not on file    Gets together: Not on file    Attends religious service: Not on file    Active member of club or organization: Not on file    Attends meetings of clubs or organizations: Not on file    Relationship status: Not on file  Other Topics Concern  . Not on file  Social History Narrative  . Not on file   Family History  Problem Relation Age of Onset  . Other Mother   . Other Father   . Pulmonary embolism Maternal Uncle     ASSESSMENT  Recent Results: The most recent result is correlated with 40 mg per week: Lab Results  Component Value Date   INR 2.3 05/21/2019   INR 1.9 (A) 04/23/2019   INR 4.2 (A) 03/28/2019   PROTIME 20.4 (H) 03/16/2011    Anticoagulation Dosing: Description   Take one tablet of your 5mg  peach-colored warfarin tablets DAILY, except on Mondays and Thursdays, take 1 & 1/2 of your 5mg  peach-colored warfarin tablets each Monday and Thursday.      INR today: Therapeutic  PLAN Weekly dose was unchanged. Patient will remain on 40mg  warfarin/week.    Patient Instructions  Patient instructed to take medications as defined in the Anti-coagulation Track section of this encounter.  Patient instructed to take today's dose.  Patient instructed to take one tablet of your 5mg  peach-colored warfarin tablets DAILY, except on Mondays and Thursdays, take 1 &  1/2 of your 5mg  peach-colored warfarin tablets each Monday and Thursday.  Patient verbalized understanding of these instructions.    Patient advised to contact clinic or seek medical attention if signs/symptoms of bleeding or thromboembolism occur.  Patient verbalized understanding by repeating back information and was advised to contact me if further medication-related questions arise. Patient was also provided an information handout.  Follow-up Return in 4 weeks (on 06/18/2019) for Follow up INR.  01-23-1979, PharmD, CPP  15 minutes spent face-to-face with the patient during the encounter. 50% of time spent on education, including signs/sx bleeding and clotting, as well as food and drug interactions with warfarin. 50% of time was spent on fingerprick POC INR sample collection,processing, results determination, and documentation in .

## 2019-05-21 NOTE — Patient Instructions (Signed)
Patient instructed to take medications as defined in the Anti-coagulation Track section of this encounter.  Patient instructed to take today's dose.  Patient instructed to take one tablet of your 5mg  peach-colored warfarin tablets DAILY, except on Mondays and Thursdays, take 1 & 1/2 of your 5mg  peach-colored warfarin tablets each Monday and Thursday.  Patient verbalized understanding of these instructions.

## 2019-06-18 ENCOUNTER — Ambulatory Visit (INDEPENDENT_AMBULATORY_CARE_PROVIDER_SITE_OTHER): Payer: BC Managed Care – PPO | Admitting: Internal Medicine

## 2019-06-18 ENCOUNTER — Other Ambulatory Visit: Payer: Self-pay

## 2019-06-18 ENCOUNTER — Encounter: Payer: Self-pay | Admitting: Internal Medicine

## 2019-06-18 ENCOUNTER — Ambulatory Visit (INDEPENDENT_AMBULATORY_CARE_PROVIDER_SITE_OTHER): Payer: BC Managed Care – PPO | Admitting: Pharmacist

## 2019-06-18 VITALS — BP 136/81 | HR 97 | Temp 98.7°F | Ht 72.0 in | Wt 208.1 lb

## 2019-06-18 DIAGNOSIS — Z7901 Long term (current) use of anticoagulants: Secondary | ICD-10-CM | POA: Diagnosis not present

## 2019-06-18 DIAGNOSIS — I2699 Other pulmonary embolism without acute cor pulmonale: Secondary | ICD-10-CM

## 2019-06-18 DIAGNOSIS — I80299 Phlebitis and thrombophlebitis of other deep vessels of unspecified lower extremity: Secondary | ICD-10-CM | POA: Diagnosis not present

## 2019-06-18 DIAGNOSIS — Z5181 Encounter for therapeutic drug level monitoring: Secondary | ICD-10-CM | POA: Diagnosis not present

## 2019-06-18 DIAGNOSIS — R222 Localized swelling, mass and lump, trunk: Secondary | ICD-10-CM | POA: Diagnosis not present

## 2019-06-18 DIAGNOSIS — R229 Localized swelling, mass and lump, unspecified: Secondary | ICD-10-CM

## 2019-06-18 LAB — POCT INR: INR: 1.7 — AB (ref 2.0–3.0)

## 2019-06-18 NOTE — Patient Instructions (Signed)
Patient instructed to take medications as defined in the Anti-coagulation Track section of this encounter.  Patient instructed to take today's dose.  Patient instructed to take  one (1) tablet of your 5mg  peach-colored warfarin tablets on Tuesdays, Thursdays and Saturdays. All OTHER DAYS, take one-and-one-half (1 & 1/2) of your 5mg  peach-colored warfarin tablets.  Patient verbalized understanding of these instructions.

## 2019-06-18 NOTE — Progress Notes (Signed)
Anticoagulation Management Richard [name] is a 43 y.o. male who reports to the clinic for monitoring of warfarin treatment.    Indication: PE and infarction, histor of (resolved); long term current use of anticoagulant.   Duration: indefinite Supervising physician: Blanch Media  Anticoagulation Clinic Visit History: Patient does not report signs/symptoms of bleeding or thromboembolism  Other recent changes: No diet, medications, lifestyle except as noted in patient findings (increased consumption of dark-green leafy vegetables).  Anticoagulation Episode Summary    Current INR goal:  2.0-3.0  TTR:  51.8 % (7.1 y)  Next INR check:  07/16/2019  INR from last check:  1.7 (06/18/2019)  Weekly max warfarin dose:    Target end date:  Indefinite  INR check location:  Anticoagulation Clinic  Preferred lab:    Send INR reminders to:     Indications   Pulmonary embolism and infarction (HCC) [I26.99] D V T [I80.299] Long term (current) use of anticoagulants [Z79.01]       Comments:          No Known Allergies  Current Outpatient Medications:  .  cetirizine (ZYRTEC) 10 MG tablet, Take 1 tablet (10 mg total) by mouth daily. (Patient not taking: Reported on 05/21/2019), Disp: 100 tablet, Rfl: 0 .  valACYclovir (VALTREX) 500 MG tablet, Take 1 tablet (500 mg total) by mouth 2 (two) times daily. For three days., Disp: 6 tablet, Rfl: 3 .  warfarin (COUMADIN) 5 MG tablet, Take one (1) tablet all days of week--EXCEPT on Mondays and Thursdays, take 1 and 1/2 tablets., Disp: 32 tablet, Rfl: 2 Past Medical History:  Diagnosis Date  . DVT (deep venous thrombosis) (HCC)   . Seasonal allergies    Social History   Socioeconomic History  . Marital status: Single    Spouse name: Not on file  . Number of children: Not on file  . Years of education: Not on file  . Highest education level: Not on file  Occupational History  . Not on file  Social Needs  . Financial resource strain: Not on  file  . Food insecurity    Worry: Not on file    Inability: Not on file  . Transportation needs    Medical: Not on file    Non-medical: Not on file  Tobacco Use  . Smoking status: Former Smoker    Years: 1.00    Types: Cigarettes, Cigars    Quit date: 04/05/2012    Years since quitting: 7.2  . Smokeless tobacco: Never Used  Substance and Sexual Activity  . Alcohol use: No    Alcohol/week: 0.0 standard drinks  . Drug use: No  . Sexual activity: Not on file  Lifestyle  . Physical activity    Days per week: Not on file    Minutes per session: Not on file  . Stress: Not on file  Relationships  . Social Musician on phone: Not on file    Gets together: Not on file    Attends religious service: Not on file    Active member of club or organization: Not on file    Attends meetings of clubs or organizations: Not on file    Relationship status: Not on file  Other Topics Concern  . Not on file  Social History Narrative  . Not on file   Family History  Problem Relation Age of Onset  . Other Mother   . Other Father   . Pulmonary embolism Maternal Uncle  ASSESSMENT Recent Results: The most recent result is correlated with 40 mg per week: Lab Results  Component Value Date   INR 1.7 (A) 06/18/2019   INR 2.3 05/21/2019   INR 1.9 (A) 04/23/2019   PROTIME 20.4 (H) 03/16/2011    Anticoagulation Dosing: Description   Take one (1) tablet of your 5mg  peach-colored warfarin tablets on Tuesdays, Thursdays and Saturdays. All OTHER DAYS, take one-and-one-half (1 & 1/2) of your 5mg  peach-colored warfarin tablets.      INR today: Subtherapeutic  PLAN Weekly dose was increased by 12% to 45 mg per week  Patient Instructions  Patient instructed to take medications as defined in the Anti-coagulation Track section of this encounter.  Patient instructed to take today's dose.  Patient instructed to take  one (1) tablet of your 5mg  peach-colored warfarin tablets on  Tuesdays, Thursdays and Saturdays. All OTHER DAYS, take one-and-one-half (1 & 1/2) of your 5mg  peach-colored warfarin tablets.  Patient verbalized understanding of these instructions.    Patient advised to contact clinic or seek medical attention if signs/symptoms of bleeding or thromboembolism occur.  Patient verbalized understanding by repeating back information and was advised to contact me if further medication-related questions arise. Patient was also provided an information handout.  Follow-up Return in 4 weeks (on 07/16/2019) for Follow up INR.  [name], PharmD, CPP  15 minutes spent face-to-face with the patient during the encounter. 50% of time spent on education, including signs/sx bleeding and clotting, as well as food and drug interactions with warfarin. 50% of time was spent on fingerprick POC INR sample collection,processing, results determination, and documentation in http://www.kim.net/.

## 2019-06-18 NOTE — Patient Instructions (Addendum)
Thank you for allowing [place] to provide your care. I think you likely have an epidermoid cyst. Below is some information about them. There is nothing to do at this point; however, if you noticed an increasing number, fever, tenderness, or associated redness please come back to see [place].  Epidermal Cyst  An epidermal cyst is a small, painless lump under your skin. The cyst contains a grayish-white, bad-smelling substance (keratin). Do not try to pop or open an epidermal cyst yourself. What are the causes?  A blocked hair follicle.  A hair that curls and re-enters the skin instead of growing straight out of the skin.  A blocked pore.  Irritated skin.  An injury to the skin.  Certain conditions that are passed along from parent to child (inherited).  Human papillomavirus (HPV).  Long-term sun damage to the skin. What increases the risk?  Having acne.  Being overweight.  Being 70-43 years old. What are the signs or symptoms? These cysts are usually harmless, but they can get infected. Symptoms of infection may include:  Redness.  Inflammation.  Tenderness.  Warmth.  Fever.  A grayish-white, bad-smelling substance drains from the cyst.  Pus drains from the cyst. How is this treated? In many cases, epidermal cysts go away on their own without treatment. If a cyst becomes infected, treatment may include:  Opening and draining the cyst, done by a doctor. After draining, you may need minor surgery to remove the rest of the cyst.  Antibiotic medicine.  Shots of medicines (steroids) that help to reduce inflammation.  Surgery to remove the cyst. Surgery may be done if the cyst: ? Becomes large. ? Bothers you. ? Has a chance of turning into cancer.  Do not try to open a cyst yourself. Follow these instructions at home:  Take over-the-counter and prescription medicines only as told by your doctor.  If you were prescribed an antibiotic medicine, take it it as told by your  doctor. Do not stop using the antibiotic even if you start to feel better.  Keep the area around your cyst clean and dry.  Wear loose, dry clothing.  Avoid touching your cyst.  Check your cyst every day for signs of infection. Check for: ? Redness, swelling, or pain. ? Fluid or blood. ? Warmth. ? Pus or a bad smell.  Keep all follow-up visits as told by your doctor. This is important. How is this prevented?  Wear clean, dry, clothing.  Avoid wearing tight clothing.  Keep your skin clean and dry. Take showers or baths every day. Contact a doctor if:  Your cyst has symptoms of infection.  Your condition does not improve or gets worse.  You have a cyst that looks different from other cysts you have had.  You have a fever. Get help right away if:  Redness spreads from the cyst into the area close by. Summary  An epidermal cyst is a sac made of skin tissue.  If a cyst becomes infected, treatment may include surgery to open and drain the cyst, or to remove it.  Take over-the-counter and prescription medicines only as told by your doctor.  Contact a doctor if your condition is not improving or is getting worse.  Keep all follow-up visits as told by your doctor. This is important. This information is not intended to replace advice given to you by your health care provider. Make sure you discuss any questions you have with your health care provider. Document Released: 09/16/2004 Document Revised: 11/30/2018  Document Reviewed: 05/18/2018 Elsevier Patient Education  [employer].

## 2019-06-19 NOTE — Assessment & Plan Note (Signed)
Patient presented to the clinic for evaluation of multiple skin nodules. He states that he noticed them to three weeks ago. They're not painful and have not drained anything. He does not have infectious symptoms including fever. He is not had any weight loss. He has not noticed any functional change. He has had similar lesions in the past. On physical exam his multiple subcutaneous nodules that are not fixed and mobile. Point-of-care ultrasound was performed that illustrated heterogenous lesions in the subcutaneous tissue.  A/P: - Discussed that these nodules are likely a epidermoid cysts - Return precautions given

## 2019-06-19 NOTE — Progress Notes (Signed)
   CC: Bumps on the back and abdomen  HPI:  [name] is a 43 y.o. male with PMHx listed below presenting for Bumps on the back and abdomen. Please see the A&P for the status of the patient's chronic medical problems.  Past Medical History:  Diagnosis Date  . DVT (deep venous thrombosis) ([place])   . Seasonal allergies    Review of Systems:  Performed and all others negative.  Physical Exam: Vitals:   06/18/19 1520  BP: 136/81  Pulse: 97  Temp: 98.7 F (37.1 C)  TempSrc: Oral  SpO2: 100%  Weight: 208 lb 1.6 oz (94.4 kg)  Height: 6' (1.829 m)   General: Well nourished male in no acute distress Pulm: Good air movement with no wheezing or crackles  CV: RRR, no murmurs, no rubs  Abdomen: Two 1-2 cm subcutaneous nodules palpated on the left flank, one on the LLQ and one in the RLQ. No skin tethering. No drainage. Mobile. No LAD.   Assessment & Plan:   See Encounters Tab for problem based charting.  Patient discussed with Dr. Lynnae January

## 2019-06-20 NOTE — Progress Notes (Signed)
Internal Medicine Clinic Attending  Case discussed with Dr. Helberg at the time of the visit.  We reviewed the resident's history and exam and pertinent patient test results.  I agree with the assessment, diagnosis, and plan of care documented in the resident's note.    

## 2019-06-29 ENCOUNTER — Emergency Department (HOSPITAL_COMMUNITY): Payer: No Typology Code available for payment source

## 2019-06-29 ENCOUNTER — Emergency Department (HOSPITAL_COMMUNITY)
Admission: EM | Admit: 2019-06-29 | Discharge: 2019-06-29 | Disposition: A | Discharge status: Home / Self Care | Payer: No Typology Code available for payment source | Attending: Emergency Medicine | Admitting: Emergency Medicine

## 2019-06-29 ENCOUNTER — Other Ambulatory Visit: Payer: Self-pay

## 2019-06-29 ENCOUNTER — Encounter (HOSPITAL_COMMUNITY): Payer: Self-pay

## 2019-06-29 DIAGNOSIS — Y929 Unspecified place or not applicable: Secondary | ICD-10-CM | POA: Diagnosis not present

## 2019-06-29 DIAGNOSIS — Z7901 Long term (current) use of anticoagulants: Secondary | ICD-10-CM | POA: Insufficient documentation

## 2019-06-29 DIAGNOSIS — Z87891 Personal history of nicotine dependence: Secondary | ICD-10-CM | POA: Diagnosis not present

## 2019-06-29 DIAGNOSIS — Z79899 Other long term (current) drug therapy: Secondary | ICD-10-CM | POA: Diagnosis not present

## 2019-06-29 DIAGNOSIS — Z23 Encounter for immunization: Secondary | ICD-10-CM | POA: Insufficient documentation

## 2019-06-29 DIAGNOSIS — Y939 Activity, unspecified: Secondary | ICD-10-CM | POA: Insufficient documentation

## 2019-06-29 DIAGNOSIS — Z86718 Personal history of other venous thrombosis and embolism: Secondary | ICD-10-CM | POA: Diagnosis not present

## 2019-06-29 DIAGNOSIS — S61312A Laceration without foreign body of right middle finger with damage to nail, initial encounter: Secondary | ICD-10-CM

## 2019-06-29 DIAGNOSIS — S61310A Laceration without foreign body of right index finger with damage to nail, initial encounter: Secondary | ICD-10-CM | POA: Insufficient documentation

## 2019-06-29 DIAGNOSIS — W319XXA Contact with unspecified machinery, initial encounter: Secondary | ICD-10-CM | POA: Diagnosis not present

## 2019-06-29 DIAGNOSIS — Y990 Civilian activity done for income or pay: Secondary | ICD-10-CM | POA: Diagnosis not present

## 2019-06-29 MED ORDER — BACITRACIN ZINC 500 UNIT/GM EX OINT: TOPICAL_OINTMENT | Freq: Two times a day (BID) | CUTANEOUS | Status: DC

## 2019-06-29 MED ORDER — LIDOCAINE HCL 2 % IJ SOLN
20.0000 mL | Freq: Once | INTRAMUSCULAR | Status: AC
  Administered 2019-06-29: 400 mg
  Filled 2019-06-29: qty 20

## 2019-06-29 MED ORDER — TETANUS-DIPHTH-ACELL PERTUSSIS 5-2.5-18.5 LF-MCG/0.5 IM SUSP
0.5000 mL | Freq: Once | INTRAMUSCULAR | Status: AC
  Administered 2019-06-29: 0.5 mL via INTRAMUSCULAR
  Filled 2019-06-29: qty 0.5

## 2019-06-29 NOTE — ED Provider Notes (Signed)
[facility] EMERGENCY DEPARTMENT Provider Note   CSN: [id number] Arrival date & time: 06/29/19  1147     History   Chief Complaint Chief Complaint  Patient presents with  . Hand Injury    HPI Debra Calabretta is a 43 y.o. male here for evaluation of injury sustained at work.  He works in a Naval architect.  He was using a Archivist and unfortunately got his right index and middle finger stuck and injured.  He has 2 lacerations across the 2 nails.  He is right-hand dominant.  Denies distal tingling or loss of sensation.  Unknown tetanus status.  Denies any other injuries from today's incident.  No interventions.     HPI  Past Medical History:  Diagnosis Date  . DVT (deep venous thrombosis) (HCC)   . Seasonal allergies     Patient Active Problem List   Diagnosis Date Noted  . STD exposure 03/17/2018  . Healthcare maintenance 03/17/2018  . Multiple skin nodules 02/01/2017  . Acquired polyneuropathy 09/01/2015  . Seropositive for herpes simplex 2 infection 08/16/2015  . Long term (current) use of anticoagulants 04/17/2012  . Tobacco abuse 04/06/2012  . Pulmonary embolism and infarction (HCC) 10/09/2010  . D V T 10/09/2010    History reviewed. No pertinent surgical history.      Home Medications    Prior to Admission medications   Medication Sig Start Date End Date Taking? Authorizing Provider  cetirizine (ZYRTEC) 10 MG tablet Take 1 tablet (10 mg total) by mouth daily. Patient not taking: Reported on 05/21/2019 08/19/15   Denton Brick, MD  valACYclovir (VALTREX) 500 MG tablet Take 1 tablet (500 mg total) by mouth 2 (two) times daily. For three days. 11/24/18   Lanelle Bal, MD  warfarin (COUMADIN) 5 MG tablet Take one (1) tablet all days of week--EXCEPT on Mondays and Thursdays, take 1 and 1/2 tablets. 05/21/19   [name], RPH-CPP    Family History Family History  Problem Relation Age of Onset  . Other Mother   . Other Father   .  Pulmonary embolism Maternal Uncle     Social History Social History   Tobacco Use  . Smoking status: Former Smoker    Years: 1.00    Types: Cigarettes, Cigars    Quit date: 04/05/2012    Years since quitting: 7.2  . Smokeless tobacco: Never Used  Substance Use Topics  . Alcohol use: No    Alcohol/week: 0.0 standard drinks  . Drug use: No     Allergies   Patient has no known allergies.   Review of Systems Review of Systems  Skin: Positive for wound.  All other systems reviewed and are negative.    Physical Exam Updated Vital Signs BP 125/74 (BP Location: Left Arm)   Pulse 74   Temp 99.4 F (37.4 C) (Oral)   Resp 16   Ht 6' (1.829 m)   Wt 93 kg   SpO2 97%   BMI 27.80 kg/m   Physical Exam Constitutional:      Appearance: He is well-developed.  HENT:     Head: Normocephalic.     Nose: Nose normal.  Eyes:     General: Lids are normal.  Neck:     Musculoskeletal: Normal range of motion.  Cardiovascular:     Rate and Rhythm: Normal rate.     Comments: Brisk cap refill at right middle and and index finger pads Pulmonary:     Effort: Pulmonary effort  is normal.  Musculoskeletal: Normal range of motion.  Skin:    Comments: Right index finger: 1 cm laceration vertically along middle of nail into radial nail fold, small corner of nail has been avulsed.  Laceration involves approximately 0.5 cm into finger pad. No focal bony tenderness to DIP, IP joints. Full ROM of digit without deficit. Right middle finger: 1 cm laceration vertically along middle of nail into radial nail fold, small corner of nail is separated from remaining nail but still attached to nail bed. No focal bony tenderness to DIP, IP joints. Full ROM of digit without deficit.  Neurological:     Mental Status: He is alert.     Comments: Sensation to light touch in two-point discrimination intact in the right middle and index finger pads.  Psychiatric:        Behavior: Behavior normal.      ED  Treatments / Results  Labs (all labs ordered are listed, but only abnormal results are displayed) Labs Reviewed - No data to display  EKG None  Radiology Dg Hand Complete Right  Result Date: 06/29/2019 CLINICAL DATA:  And laceration. Laceration to the distal index and middle fingers. EXAM: RIGHT HAND - COMPLETE 3+ VIEW COMPARISON:  None. FINDINGS: Soft tissue injury demonstrated of the distal index and middle finger. No evidence for underlying osseous injury. No acute fracture or dislocation. No radiopaque foreign body. IMPRESSION: Soft tissue injury to the distal index and middle fingers without underlying osseous injury identified. Electronically Signed   By: Lovey Newcomer M.D.   On: 06/29/2019 13:01    Procedures .Marland KitchenLaceration Repair  Date/Time: 06/29/2019 4:15 PM Performed by: Kinnie Feil, PA-C Authorized by: Kinnie Feil, PA-C   Consent:    Consent obtained:  Verbal   Consent given by:  Patient   Risks discussed:  Infection, need for additional repair, pain, poor cosmetic result and poor wound healing   Alternatives discussed:  No treatment and delayed treatment Universal protocol:    Procedure explained and questions answered to patient or proxy's satisfaction: yes     Relevant documents present and verified: yes     Test results available and properly labeled: yes     Imaging studies available: yes     Required blood products, implants, devices, and special equipment available: yes     Site/side marked: yes     Immediately prior to procedure, a time out was called: yes     Patient identity confirmed:  Verbally with patient Anesthesia (see MAR for exact dosages):    Anesthesia method:  Local infiltration   Local anesthetic:  Lidocaine 2% w/o epi Laceration details:    Location:  Finger   Finger location:  R long finger   Length (cm):  1 Repair type:    Repair type:  Complex (nail and nail bed involvement) Pre-procedure details:    Preparation:  Patient was  prepped and draped in usual sterile fashion and imaging obtained to evaluate for foreign bodies Exploration:    Limited defect created (wound extended): no     Hemostasis achieved with:  Direct pressure   Wound exploration: wound explored through full range of motion and entire depth of wound probed and visualized     Wound extent: no tendon damage noted, no underlying fracture noted and no vascular damage noted     Contaminated: no   Treatment:    Area cleansed with:  Betadine and saline (peroxide)   Amount of cleaning:  Extensive  Irrigation method:  Pressure wash, syringe and tap   Debridement:  None   Undermining:  None   Scar revision: no   Skin repair:    Repair method:  Sutures   Suture size:  3-0   Suture material:  Chromic gut   Suture technique:  Simple interrupted   Number of sutures:  1 Approximation:    Approximation:  Close Post-procedure details:    Dressing:  Antibiotic ointment, non-adherent dressing and splint for protection   Patient tolerance of procedure:  Tolerated well, no immediate complications Comments:     One simple interrupted suture placed to attach radial small nail piece to nailbed and remaining nail piece more medially  .Marland KitchenLaceration Repair  Date/Time: 06/29/2019 4:16 PM Performed by: Liberty Handy, PA-C Authorized by: Liberty Handy, PA-C   Consent:    Consent obtained:  Verbal   Consent given by:  Patient   Risks discussed:  Infection, need for additional repair, pain, poor cosmetic result and poor wound healing   Alternatives discussed:  No treatment and delayed treatment Universal protocol:    Procedure explained and questions answered to patient or proxy's satisfaction: yes     Relevant documents present and verified: yes     Test results available and properly labeled: yes     Imaging studies available: yes     Required blood products, implants, devices, and special equipment available: yes     Site/side marked: yes      Immediately prior to procedure, a time out was called: yes     Patient identity confirmed:  Verbally with patient Anesthesia (see MAR for exact dosages):    Anesthesia method:  Local infiltration Laceration details:    Location:  Finger   Finger location:  R index finger   Length (cm):  1 Repair type:    Repair type:  Simple Pre-procedure details:    Preparation:  Patient was prepped and draped in usual sterile fashion and imaging obtained to evaluate for foreign bodies Exploration:    Hemostasis achieved with:  Direct pressure   Wound exploration: wound explored through full range of motion and entire depth of wound probed and visualized     Wound extent: no tendon damage noted, no underlying fracture noted and no vascular damage noted     Contaminated: no   Treatment:    Area cleansed with:  Betadine and saline (peroxide)   Amount of cleaning:  Extensive   Irrigation method:  Pressure wash, syringe and tap Skin repair:    Repair method:  Sutures   Suture size:  3-0   Suture material:  Chromic gut   Suture technique:  Simple interrupted   Number of sutures:  1 Approximation:    Approximation:  Close Post-procedure details:    Dressing:  Antibiotic ointment, non-adherent dressing and splint for protection   Patient tolerance of procedure:  Tolerated well, no immediate complications Comments:     Laceration of finger pad underneath nail repaired with one suture    (including critical care time)  Medications Ordered in ED Medications  Tdap (BOOSTRIX) injection 0.5 mL (0.5 mLs Intramuscular Given 06/29/19 1254)  lidocaine (XYLOCAINE) 2 % (with pres) injection 400 mg (400 mg Infiltration Given by Other 06/29/19 1451)     Initial Impression / Assessment and Plan / ED Course  I have reviewed the triage vital signs and the nursing notes.  Pertinent labs & imaging results that were available during my care of the patient were reviewed  by me and considered in my medical decision  making (see chart for details).  Clinical Course as of Jun 29 1619  Fri Jun 29, 2019  1328 IMPRESSION: Soft tissue injury to the distal index and middle fingers without underlying osseous injury identified.  DG Hand Complete Right [CG]    Clinical Course User Index [CG] Liberty HandyGibbons, Lianne Carreto J, PA-C   Laceration was thoroughly irrigated and explored.  Both lacerations affect the nail and nailbed.  Cuticle and nail still attached and intact.  Lacerations repaired without immediate complications.  Extremity is neurovascularly intact prior to procedure with full range of motion.  X-ray obtained in triage without evidence of bony involvement.  Will DC with wound care instructions, NSAIDs.  Tetanus was updated.  Sutures utilized are absorbable and patient was instructed to monitor the wounds and monitor for infections but no need for suture removal.  Return precautions discussed.  He is comfortable with this.  Final Clinical Impressions(s) / ED Diagnoses   Final diagnoses:  Work related injury  Laceration of right middle finger without foreign body with damage to nail, initial encounter  Laceration of right index finger without foreign body with damage to nail, initial encounter    ED Discharge Orders    None       Jerrell MylarGibbons, Riddick Nuon J, PA-C 06/29/19 1620    Alvira MondaySchlossman, Erin, MD 06/29/19 2105

## 2019-06-29 NOTE — ED Triage Notes (Signed)
Pt arrives POV for eval of R hand injury from machine cutter at work. Tips of pointer and middle finger are sliced. Oozing blood in triage, chux pad and pressure applied. Tetanus is expired.

## 2019-06-29 NOTE — Discharge Instructions (Addendum)
You are seen in the ER for injury in the right middle and index finger.  X-rays did not show involvement of the bone.  Your tetanus was updated.  We placed 1 suture in the right index finger and one suture in the right middle finger.  These are absorbable.  Slowly, as the wounds heal and the nail grows stitches should absorb and/or fall off slowly.  Take Tylenol or ibuprofen for pain.  Keep original dressing on for the next 24 to 48 hours.  After that, you can rinse the wounds with clean water and soap, tap dry and apply an antibiotic ointment and reapply dressing.  Monitor for signs of infection and return for fever, swelling, redness, warmth, pus.  You may return to work if work will allow you to modify your tasks and activities.  You need to be fully cleared in the next 7 to 10 days before you are able to fully return to work without restrictions.  This needs to be done by primary care doctor.

## 2019-06-29 NOTE — ED Notes (Signed)
Hand soaking.

## 2019-07-09 ENCOUNTER — Other Ambulatory Visit: Payer: Self-pay

## 2019-07-09 ENCOUNTER — Ambulatory Visit (INDEPENDENT_AMBULATORY_CARE_PROVIDER_SITE_OTHER): Payer: No Typology Code available for payment source | Admitting: Internal Medicine

## 2019-07-09 ENCOUNTER — Encounter: Payer: Self-pay | Admitting: Internal Medicine

## 2019-07-09 DIAGNOSIS — Z7901 Long term (current) use of anticoagulants: Secondary | ICD-10-CM

## 2019-07-09 DIAGNOSIS — Z86711 Personal history of pulmonary embolism: Secondary | ICD-10-CM

## 2019-07-09 DIAGNOSIS — W3189XD Contact with other specified machinery, subsequent encounter: Secondary | ICD-10-CM

## 2019-07-09 DIAGNOSIS — S61310D Laceration without foreign body of right index finger with damage to nail, subsequent encounter: Secondary | ICD-10-CM

## 2019-07-09 DIAGNOSIS — I2699 Other pulmonary embolism without acute cor pulmonale: Secondary | ICD-10-CM

## 2019-07-09 DIAGNOSIS — S61411D Laceration without foreign body of right hand, subsequent encounter: Secondary | ICD-10-CM

## 2019-07-09 DIAGNOSIS — S61312D Laceration without foreign body of right middle finger with damage to nail, subsequent encounter: Secondary | ICD-10-CM | POA: Diagnosis not present

## 2019-07-09 DIAGNOSIS — S61411A Laceration without foreign body of right hand, initial encounter: Secondary | ICD-10-CM | POA: Insufficient documentation

## 2019-07-09 NOTE — Assessment & Plan Note (Signed)
Patient states today that he would prefer to remain on warfarin therapy.  He will continue to follow with Dr. Elie Confer.

## 2019-07-09 NOTE — Assessment & Plan Note (Signed)
  Patient presented today with concern for laceration of his fingers. This began on November 6 after he had his pointer and index finger of his right hand caught in a sealer machine at work. Is described as greatly improved with initial sharp stabbing quality. Is a 2/10 in severity at this time.  He denies swelling, redness, drainage or other concerns. Exam appears to demonstrate healing but tender distal tips of his second and third digits of the right hand.  There is some ecchymosis superficial to the nail with damage to the distal tip of the nails.  No obvious signs of marked deformity or acute infection.  Plan: No additional evaluation recommended.  Patient given work release paperwork. -Given strict return precautions

## 2019-07-09 NOTE — Progress Notes (Signed)
   CC: cut on his fingers  HPI:[name] is a 43 y.o. male who presents for evaluation of a laceration on the 2nd and 3rd digits of his right hand. Please see individual problem based A/P for details.  Past Medical History:  Diagnosis Date  . DVT (deep venous thrombosis) ([place])   . Seasonal allergies    Review of Systems:  ROS negative except as per HPI.  Physical Exam: Vitals:   07/09/19 1337  BP: (!) 142/85  Pulse: 96  Temp: 98.3 F (36.8 C)  TempSrc: Oral  SpO2: 99%  Weight: 204 lb 12.8 oz (92.9 kg)  Height: 6' (1.829 m)   General: A/O x4, in no acute distress, afebrile, nondiaphoretic HEENT: PEERL, EMO intact MSK: Well-healed laceration of the distal tip, radial side, second and third digits of the right hand Psych: Appropriate affect, not depressed in appearance, engages well  Assessment & Plan:   See Encounters Tab for problem based charting.  Patient discussed with Dr. Dareen Piano

## 2019-07-09 NOTE — Patient Instructions (Signed)
FOLLOW-UP INSTRUCTIONS When: As needed What to bring: All of your medications  As always if your symptoms worsen, fail to improve, or you develop other concerning symptoms, please notify our office or visit the local ER if we are unavailable. Symptoms including redness, swelling, pain, drainage, should not be ignored and should encourage you to visit the ED if we are unavailable by phone or the symptoms are severe.  Thank you for your visit to the [facility] today. If you have any questions or concerns please call us at [phone].

## 2019-07-12 ENCOUNTER — Other Ambulatory Visit: Payer: Self-pay | Admitting: Internal Medicine

## 2019-07-12 DIAGNOSIS — Z7901 Long term (current) use of anticoagulants: Secondary | ICD-10-CM

## 2019-07-12 DIAGNOSIS — I2699 Other pulmonary embolism without acute cor pulmonale: Secondary | ICD-10-CM

## 2019-07-12 NOTE — Telephone Encounter (Signed)
Needs refill on   warfarin (COUMADIN) 5 MG tablet   Mark as Reviewed    ;pt contact [place], [place]

## 2019-07-13 MED ORDER — WARFARIN SODIUM 5 MG PO TABS: ORAL_TABLET | ORAL | 2 refills | Status: DC

## 2019-07-16 ENCOUNTER — Ambulatory Visit (INDEPENDENT_AMBULATORY_CARE_PROVIDER_SITE_OTHER): Payer: BC Managed Care – PPO | Admitting: Pharmacist

## 2019-07-16 ENCOUNTER — Other Ambulatory Visit: Payer: Self-pay

## 2019-07-16 ENCOUNTER — Other Ambulatory Visit: Payer: Self-pay | Admitting: Pharmacist

## 2019-07-16 DIAGNOSIS — I2699 Other pulmonary embolism without acute cor pulmonale: Secondary | ICD-10-CM

## 2019-07-16 DIAGNOSIS — Z7901 Long term (current) use of anticoagulants: Secondary | ICD-10-CM

## 2019-07-16 DIAGNOSIS — Z5181 Encounter for therapeutic drug level monitoring: Secondary | ICD-10-CM

## 2019-07-16 DIAGNOSIS — Z86718 Personal history of other venous thrombosis and embolism: Secondary | ICD-10-CM

## 2019-07-16 LAB — POCT INR: INR: 4.7 — AB (ref 2.0–3.0)

## 2019-07-16 MED ORDER — WARFARIN SODIUM 5 MG PO TABS: ORAL_TABLET | ORAL | 2 refills | Status: DC

## 2019-07-16 NOTE — Progress Notes (Signed)
Anticoagulation Management Richard [name] is a 43 y.o. male who reports to the clinic for monitoring of warfarin treatment.    Indication: PE, History of (with infarction); Long term current use of anticoagulant.   Duration: indefinite Supervising physician: [place] [facility] Visit History: Patient does not report signs/symptoms of bleeding or thromboembolism  Other recent changes: No diet, medications, lifestyle changes.  Anticoagulation Episode Summary    Current INR goal:  2.0-3.0  TTR:  51.6 % (7.2 y)  Next INR check:  07/30/2019  INR from last check:  4.7 (07/16/2019)  Weekly max warfarin dose:    Target end date:  Indefinite  INR check location:  Anticoagulation Clinic  Preferred lab:    Send INR reminders to:     Indications   Pulmonary embolism and infarction ([place]) [I26.99] D V T (Resolved) [I80.299] Long term (current) use of anticoagulants [Z79.01]       Comments:          No Known Allergies  Current Outpatient Medications:  .  warfarin (COUMADIN) 5 MG tablet, Take 1 and 1/2 tablets on all days of the week--EXCEPT on Tuesdays, Thursdays and Saturdays, take ONLY one (1) tablet on these days., Disp: 36 tablet, Rfl: 2 .  cetirizine (ZYRTEC) 10 MG tablet, Take 1 tablet (10 mg total) by mouth daily. (Patient not taking: Reported on 05/21/2019), Disp: 100 tablet, Rfl: 0 .  valACYclovir (VALTREX) 500 MG tablet, Take 1 tablet (500 mg total) by mouth 2 (two) times daily. For three days. (Patient not taking: Reported on 07/16/2019), Disp: 6 tablet, Rfl: 3 Past Medical History:  Diagnosis Date  . D V T 10/09/2010   Qualifier: Diagnosis of  By: Elsworth Soho MD, Leanna Sato     . DVT (deep venous thrombosis) ([place])   . Seasonal allergies    Social History   Socioeconomic History  . Marital status: Single    Spouse name: Not on file  . Number of children: Not on file  . Years of education: Not on file  . Highest education level: Not on file  Occupational  History  . Not on file  Social Needs  . Financial resource strain: Not on file  . Food insecurity    Worry: Not on file    Inability: Not on file  . Transportation needs    Medical: Not on file    Non-medical: Not on file  Tobacco Use  . Smoking status: Former Smoker    Years: 1.00    Types: Cigarettes, Cigars    Quit date: 04/05/2012    Years since quitting: 7.2  . Smokeless tobacco: Never Used  Substance and Sexual Activity  . Alcohol use: No    Alcohol/week: 0.0 standard drinks  . Drug use: No  . Sexual activity: Not on file  Lifestyle  . Physical activity    Days per week: Not on file    Minutes per session: Not on file  . Stress: Not on file  Relationships  . Social Herbalist on phone: Not on file    Gets together: Not on file    Attends religious service: Not on file    Active member of club or organization: Not on file    Attends meetings of clubs or organizations: Not on file    Relationship status: Not on file  Other Topics Concern  . Not on file  Social History Narrative  . Not on file   Family History  Problem Relation  Age of Onset  . Other Mother   . Other Father   . Pulmonary embolism Maternal Uncle     ASSESSMENT Recent Results: The most recent result is correlated with 45 mg per week: Lab Results  Component Value Date   INR 4.7 (A) 07/16/2019   INR 1.7 (A) 06/18/2019   INR 2.3 05/21/2019   PROTIME 20.4 (H) 03/16/2011    Anticoagulation Dosing: Description   OMIT dose for Tuesday 17-Jul-2019. Resume taking warfarin on Wednesday, 18-Jul-2019. Take one (1) tablet by mouth daily at 6PM--except on Thursdays and Sundays, take 1 and 1/2 x 5mg  (7.5mg ) on Thursdays and Sundays.      INR today: Supratherapeutic  PLAN Weekly dose was decreased by 11% to 40 mg per week. Am OMITTING tomorrow' dose for one day/dose only.   Patient Instructions  Patient instructed to take medications as defined in the Anti-coagulation Track section of  this encounter.  Patient instructed to take today's dose. (Has already taken).  Patient instructed to OMIT dose for Tuesday 17-Jul-2019. Resume taking warfarin on Wednesday, 18-Jul-2019. Take one (1) tablet by mouth daily at 6PM--except on Thursdays and Sundays, take 1 and 1/2 x 5mg  (7.5mg ) on Thursdays and Sundays.  Patient verbalized understanding of these instructions.    Patient advised to contact clinic or seek medical attention if signs/symptoms of bleeding or thromboembolism occur.  Patient verbalized understanding by repeating back information and was advised to contact me if further medication-related questions arise. Patient was also provided an information handout.  Follow-up Return in 2 weeks (on 07/30/2019) for Follow up INR.  12-05-1972, PharmD, CPP  15 minutes spent face-to-face with the patient during the encounter. 50% of time spent on education, including signs/sx bleeding and clotting, as well as food and drug interactions with warfarin. 50% of time was spent on fingerprick POC INR sample collection,processing, results determination, and documentation in 14/02/2019.

## 2019-07-16 NOTE — Patient Instructions (Signed)
Patient instructed to take medications as defined in the Anti-coagulation Track section of this encounter.  Patient instructed to take today's dose. (Has already taken).  Patient instructed to OMIT dose for Tuesday 17-Jul-2019. Resume taking warfarin on Wednesday, 18-Jul-2019. Take one (1) tablet by mouth daily at 6PM--except on Thursdays and Sundays, take 1 and 1/2 x 5mg  (7.5mg ) on Thursdays and Sundays.  Patient verbalized understanding of these instructions.

## 2019-07-17 NOTE — Progress Notes (Signed)
Internal Medicine Clinic Attending  Case discussed with Dr. Harbrecht at the time of the visit.  We reviewed the resident's history and exam and pertinent patient test results.  I agree with the assessment, diagnosis, and plan of care documented in the resident's note.   

## 2019-07-30 ENCOUNTER — Ambulatory Visit (INDEPENDENT_AMBULATORY_CARE_PROVIDER_SITE_OTHER): Payer: Self-pay | Admitting: Pharmacist

## 2019-07-30 ENCOUNTER — Ambulatory Visit (INDEPENDENT_AMBULATORY_CARE_PROVIDER_SITE_OTHER): Payer: Self-pay | Admitting: Internal Medicine

## 2019-07-30 ENCOUNTER — Other Ambulatory Visit: Payer: Self-pay

## 2019-07-30 ENCOUNTER — Other Ambulatory Visit: Payer: Self-pay | Admitting: Pharmacist

## 2019-07-30 DIAGNOSIS — Z86711 Personal history of pulmonary embolism: Secondary | ICD-10-CM

## 2019-07-30 DIAGNOSIS — Z7901 Long term (current) use of anticoagulants: Secondary | ICD-10-CM

## 2019-07-30 DIAGNOSIS — N529 Male erectile dysfunction, unspecified: Secondary | ICD-10-CM

## 2019-07-30 DIAGNOSIS — Z86718 Personal history of other venous thrombosis and embolism: Secondary | ICD-10-CM

## 2019-07-30 DIAGNOSIS — I2699 Other pulmonary embolism without acute cor pulmonale: Secondary | ICD-10-CM

## 2019-07-30 DIAGNOSIS — Z5181 Encounter for therapeutic drug level monitoring: Secondary | ICD-10-CM

## 2019-07-30 LAB — POCT INR: INR: 2.7 (ref 2.0–3.0)

## 2019-07-30 NOTE — Progress Notes (Signed)
Anticoagulation Management Richard [name] is a 43 y.o. male who reports to the clinic for monitoring of warfarin treatment.    Indication: PE with infarct (history of; resolved); Long term current use of anticoagulant. DVT, History of (resolved).  Duration: 1 year Supervising physician: Joni Reining  Anticoagulation Clinic Visit History: Patient does not report signs/symptoms of bleeding or thromboembolism  Other recent changes: No diet, medications, lifestyle changes endorsed by the patient at this visit.  Anticoagulation Episode Summary    Current INR goal:  2.0-3.0  TTR:  51.4 % (7.3 y)  Next INR check:  08/27/2019  INR from last check:  2.7 (07/30/2019)  Weekly max warfarin dose:    Target end date:  Indefinite  INR check location:  Anticoagulation Clinic  Preferred lab:    Send INR reminders to:     Indications   Pulmonary embolism and infarction ([place]) [I26.99] D V T (Resolved) [I80.299] Long term (current) use of anticoagulants [Z79.01]       Comments:          No Known Allergies  Current Outpatient Medications:  .  cetirizine (ZYRTEC) 10 MG tablet, Take 1 tablet (10 mg total) by mouth daily., Disp: 100 tablet, Rfl: 0 .  warfarin (COUMADIN) 5 MG tablet, Take 1 and 1/2 tablets on Sundays and Thursdays. All other days, take ONLY one (1) tablet., Disp: 32 tablet, Rfl: 2 .  valACYclovir (VALTREX) 500 MG tablet, Take 1 tablet (500 mg total) by mouth 2 (two) times daily. For three days. (Patient not taking: Reported on 07/30/2019), Disp: 6 tablet, Rfl: 3 Past Medical History:  Diagnosis Date  . D V T 10/09/2010   Qualifier: Diagnosis of  By: Elsworth Soho MD, Leanna Sato     . DVT (deep venous thrombosis) ([place])   . Seasonal allergies    Social History   Socioeconomic History  . Marital status: Single    Spouse name: Not on file  . Number of children: Not on file  . Years of education: Not on file  . Highest education level: Not on file  Occupational History  . Not on file   Social Needs  . Financial resource strain: Not on file  . Food insecurity    Worry: Not on file    Inability: Not on file  . Transportation needs    Medical: Not on file    Non-medical: Not on file  Tobacco Use  . Smoking status: Former Smoker    Years: 1.00    Types: Cigarettes, Cigars    Quit date: 04/05/2012    Years since quitting: 7.3  . Smokeless tobacco: Never Used  Substance and Sexual Activity  . Alcohol use: No    Alcohol/week: 0.0 standard drinks  . Drug use: No  . Sexual activity: Not on file  Lifestyle  . Physical activity    Days per week: Not on file    Minutes per session: Not on file  . Stress: Not on file  Relationships  . Social Herbalist on phone: Not on file    Gets together: Not on file    Attends religious service: Not on file    Active member of club or organization: Not on file    Attends meetings of clubs or organizations: Not on file    Relationship status: Not on file  Other Topics Concern  . Not on file  Social History Narrative  . Not on file   Family History  Problem Relation Age  of Onset  . Other Mother   . Other Father   . Pulmonary embolism Maternal Uncle     ASSESSMENT Recent Results: The most recent result is correlated with 40 mg per week: Lab Results  Component Value Date   INR 2.7 07/30/2019   INR 4.7 (A) 07/16/2019   INR 1.7 (A) 06/18/2019   PROTIME 20.4 (H) 03/16/2011    Anticoagulation Dosing: Description   Take one (1) tablet by mouth daily at 6PM--except on Thursdays and Sundays, take 1 and 1/2 x 5mg  (7.5mg ) on Thursdays and Sundays.      INR today: Therapeutic  PLAN Weekly dose was unchanged.    Patient Instructions  Patient instructed to take medications as defined in the Anti-coagulation Track section of this encounter.  Patient instructed to take today's dose.  Patient instructed to take one (1) tablet by mouth daily at 6PM--except on Thursdays and Sundays, take 1 and 1/2 x 5mg  (7.5mg ) on  Thursdays and Sundays.  Patient verbalized understanding of these instructions.    Patient advised to contact clinic or seek medical attention if signs/symptoms of bleeding or thromboembolism occur.  Patient verbalized understanding by repeating back information and was advised to contact me if further medication-related questions arise. Patient was also provided an information handout.  Follow-up Return in 4 weeks (on 08/27/2019) for Follow up INR.  12-05-1972, PharmD, CPP  15 minutes spent face-to-face with the patient during the encounter. 50% of time spent on education, including signs/sx bleeding and clotting, as well as food and drug interactions with warfarin. 50% of time was spent on fingerprick POC INR sample collection,processing, results determination, and documentation in 10/25/2019.

## 2019-07-30 NOTE — Patient Instructions (Signed)
Patient instructed to take medications as defined in the Anti-coagulation Track section of this encounter.  Patient instructed to take today's dose.  Patient instructed to take one (1) tablet by mouth daily at 6PM--except on Thursdays and Sundays, take 1 and 1/2 x 5mg  (7.5mg ) on Thursdays and Sundays.  Patient verbalized understanding of these instructions.

## 2019-07-30 NOTE — Patient Instructions (Signed)
FOLLOW-UP INSTRUCTIONS When: As needed What to bring: All of your medications  I have not made any changes to your medications today.   Today we discussed your major concerns of erectile dysfunction. I feel that this ss largely a non physiologic process and that you will improve with time. I encourage you to speak to your significant other regarding the issue and to be open about this. I recommend laboratory testing today to rule out organic causes but will defer these per your request.   Thank you for your visit to the [facility] today. If you have any questions or concerns please call us at [phone].

## 2019-07-30 NOTE — Progress Notes (Signed)
   CC: arousal concerns  HPI:[name] is a 43 y.o. male who presents for evaluation of arousal concerns. Please see individual problem based A/P for details.  Past Medical History:  Diagnosis Date  . D V T 10/09/2010   Qualifier: Diagnosis of  By: Elsworth Soho MD, Leanna Sato     . DVT (deep venous thrombosis) ([place])   . Seasonal allergies    Review of Systems:  ROS negative except as per HPI.  Physical Exam: Vitals:   07/30/19 1518  BP: 132/89  Pulse: 90  Temp: 98.5 F (36.9 C)  TempSrc: Oral  SpO2: 99%  Weight: 210 lb 12.8 oz (95.6 kg)  Height: 6' (1.829 m)   General: A/O x4, in no acute distress, afebrile, nondiaphoretic HEENT: PEERL, EMO intact Cardio: RRR, no mrg's  Pulmonary: CTA bilaterally, no wheezing or crackles  MSK: BLE nontender, nonedematous Psych: Appropriate affect, not depressed in appearance, engages well  Assessment & Plan:   See Encounters Tab for problem based charting.  Patient discussed with Dr. Angelia Mould

## 2019-07-31 ENCOUNTER — Encounter: Payer: Self-pay | Admitting: Internal Medicine

## 2019-07-31 NOTE — Assessment & Plan Note (Signed)
  Erectile dysfunction: The patient endorses an approximate 43-month history of erectile dysfunction.  He describes this as the inability to initiate and on rare occasions maintain erection approximately 30% of the time while attempting intercourse.  States that he has been with the same partner for the past 5 months.  He believes that he has had this intermittently in the past 3 to 5 years with various partners but on very rare occasions prior to the past 7 months.  He denies being aware of penile tumescence.  He does not have a history of diabetes, CAD, STDs, PVD, PAD, or CKD.  He is a non-smoker, drinks alcohol on occasion which she notes improves performs decreases the risk of the dysfunction.  He denies drug use including denying marijuana.  He is able to perform on most occasions without placidity of the penis during intercourse occurring in ejaculation is intact. Today we discussed possible the differentiation of psychogenic versus physiologic.  I recommended extensive lab evaluation and consideration of referral to a specialized center.  Patient would like to forego these at this time and believes he will discuss tissue with his partner and work on therapies at home. He is not on an SSRI.  Plan: TSH, CBC, CMP, AM testosterone, Lipid profile recommended Patient wished to defer this today Consider interviewing both the patient and his significant other No indication for medication

## 2019-08-01 NOTE — Progress Notes (Signed)
Internal Medicine Clinic Attending  Case discussed with Dr. Harbrecht at the time of the visit.  We reviewed the resident's history and exam and pertinent patient test results.  I agree with the assessment, diagnosis, and plan of care documented in the resident's note.   

## 2019-08-27 ENCOUNTER — Other Ambulatory Visit: Payer: Self-pay

## 2019-08-27 ENCOUNTER — Ambulatory Visit (INDEPENDENT_AMBULATORY_CARE_PROVIDER_SITE_OTHER): Payer: Self-pay | Admitting: Pharmacist

## 2019-08-27 DIAGNOSIS — Z86711 Personal history of pulmonary embolism: Secondary | ICD-10-CM

## 2019-08-27 DIAGNOSIS — Z5181 Encounter for therapeutic drug level monitoring: Secondary | ICD-10-CM

## 2019-08-27 DIAGNOSIS — Z86718 Personal history of other venous thrombosis and embolism: Secondary | ICD-10-CM

## 2019-08-27 DIAGNOSIS — Z7901 Long term (current) use of anticoagulants: Secondary | ICD-10-CM

## 2019-08-27 DIAGNOSIS — I2699 Other pulmonary embolism without acute cor pulmonale: Secondary | ICD-10-CM

## 2019-08-27 LAB — POCT INR: INR: 2.1 (ref 2.0–3.0)

## 2019-08-27 MED ORDER — WARFARIN SODIUM 5 MG PO TABS: ORAL_TABLET | ORAL | 2 refills | Status: DC

## 2019-08-27 NOTE — Patient Instructions (Signed)
Patient instructed to take medications as defined in the Anti-coagulation Track section of this encounter.  Patient instructed to take today's dose.  Patient instructed to take one (1) tablet by mouth daily at 6PM--except on Sundays, Mondays, Wednesdays and Fridays, take 1 and 1/2 x 5mg  (7.5mg ) on Sundays, Mondays, Wednesdays and Fridays.  Patient verbalized understanding of these instructions.

## 2019-08-27 NOTE — Progress Notes (Signed)
Anticoagulation Management Richard [name] is a 44 y.o. male who reports to the clinic for monitoring of warfarin treatment.    Indication: PE with infarction (History of; resolved), DVT, History of (resolved); Long term current use of anticoagulant.   Duration: indefinite Supervising physician: Velna Ochs, MD  Anticoagulation Clinic Visit History: Patient does not report signs/symptoms of bleeding or thromboembolism  Other recent changes: No diet, medications, lifestyle changes.  Anticoagulation Episode Summary    Current INR goal:  2.0-3.0  TTR:  51.4 % (7.3 y)  Next INR check:  09/24/2019  INR from last check:    Weekly max warfarin dose:    Target end date:  Indefinite  INR check location:  Anticoagulation Clinic  Preferred lab:    Send INR reminders to:     Indications   Pulmonary embolism and infarction ([place]) [I26.99] D V T (Resolved) [I80.299] Long term (current) use of anticoagulants [Z79.01]       Comments:          No Known Allergies  Current Outpatient Medications:  .  cetirizine (ZYRTEC) 10 MG tablet, Take 1 tablet (10 mg total) by mouth daily., Disp: 100 tablet, Rfl: 0 .  valACYclovir (VALTREX) 500 MG tablet, Take 1 tablet (500 mg total) by mouth 2 (two) times daily. For three days., Disp: 6 tablet, Rfl: 3 .  warfarin (COUMADIN) 5 MG tablet, Take 1 and 1/2 tablets on Sundays, Mondays, Wednesdays and Fridays. All other days, take ONLY one (1) tablet., Disp: 40 tablet, Rfl: 2 Past Medical History:  Diagnosis Date  . D V T 10/09/2010   Qualifier: Diagnosis of  By: Elsworth Soho MD, Leanna Sato     . DVT (deep venous thrombosis) ([place])   . Seasonal allergies    Social History   Socioeconomic History  . Marital status: Single    Spouse name: Not on file  . Number of children: Not on file  . Years of education: Not on file  . Highest education level: Not on file  Occupational History  . Not on file  Tobacco Use  . Smoking status: Former Smoker    Years: 1.00   Types: Cigarettes, Cigars    Quit date: 04/05/2012    Years since quitting: 7.3  . Smokeless tobacco: Never Used  Substance and Sexual Activity  . Alcohol use: No    Alcohol/week: 0.0 standard drinks  . Drug use: No  . Sexual activity: Not on file  Other Topics Concern  . Not on file  Social History Narrative  . Not on file   Social Determinants of Health   Financial Resource Strain:   . Difficulty of Paying Living Expenses: Not on file  Food Insecurity:   . Worried About Charity fundraiser in the Last Year: Not on file  . Ran Out of Food in the Last Year: Not on file  Transportation Needs:   . Lack of Transportation (Medical): Not on file  . Lack of Transportation (Non-Medical): Not on file  Physical Activity:   . Days of Exercise per Week: Not on file  . Minutes of Exercise per Session: Not on file  Stress:   . Feeling of Stress : Not on file  Social Connections:   . Frequency of Communication with Friends and Family: Not on file  . Frequency of Social Gatherings with Friends and Family: Not on file  . Attends Religious Services: Not on file  . Active Member of Clubs or Organizations: Not on file  .  Attends Banker Meetings: Not on file  . Marital Status: Not on file   Family History  Problem Relation Age of Onset  . Other Mother   . Other Father   . Pulmonary embolism Maternal Uncle     ASSESSMENT Recent Results: The most recent result is correlated with 40 mg per week: Lab Results  Component Value Date   INR 2.7 07/30/2019   INR 4.7 (A) 07/16/2019   INR 1.7 (A) 06/18/2019   PROTIME 20.4 (H) 03/16/2011    Anticoagulation Dosing: Description   Take one (1) tablet by mouth daily at 6PM--except on Sundays, Mondays, Wednesdays and Fridays, take 1 and 1/2 x 5mg  (7.5mg ) on Sundays, Mondays, Wednesdays and Fridays.      INR today: Therapeutic  PLAN Weekly dose was increased by 12% to 45 mg per week  Patient Instructions  Patient instructed to  take medications as defined in the Anti-coagulation Track section of this encounter.  Patient instructed to take today's dose.  Patient instructed to take one (1) tablet by mouth daily at 6PM--except on Sundays, Mondays, Wednesdays and Fridays, take 1 and 1/2 x 5mg  (7.5mg ) on Sundays, Mondays, Wednesdays and Fridays.  Patient verbalized understanding of these instructions.    Patient advised to contact clinic or seek medical attention if signs/symptoms of bleeding or thromboembolism occur.  Patient verbalized understanding by repeating back information and was advised to contact me if further medication-related questions arise. Patient was also provided an information handout.  Follow-up Return in 4 weeks (on 09/24/2019) for Follow up INR.  01-10-1990, PharmD, CPP  15 minutes spent face-to-face with the patient during the encounter. 50% of time spent on education, including signs/sx bleeding and clotting, as well as food and drug interactions with warfarin. 50% of time was spent on fingerprick POC INR sample collection,processing, results determination, and documentation in 11/22/2019.

## 2019-08-28 NOTE — Progress Notes (Signed)
INTERNAL MEDICINE TEACHING ATTENDING ADDENDUM   I agree with pharmacy recommendations as outlined in their note.   Pernell Lenoir, MD  

## 2019-09-24 ENCOUNTER — Other Ambulatory Visit (INDEPENDENT_AMBULATORY_CARE_PROVIDER_SITE_OTHER): Payer: Self-pay

## 2019-09-24 ENCOUNTER — Telehealth: Payer: Self-pay | Admitting: Pharmacist

## 2019-09-24 DIAGNOSIS — Z7901 Long term (current) use of anticoagulants: Secondary | ICD-10-CM

## 2019-09-24 DIAGNOSIS — I2699 Other pulmonary embolism without acute cor pulmonale: Secondary | ICD-10-CM

## 2019-09-24 DIAGNOSIS — Z5181 Encounter for therapeutic drug level monitoring: Secondary | ICD-10-CM

## 2019-09-24 LAB — POCT INR: INR: 2.2 (ref 2.0–3.0)

## 2019-09-24 MED ORDER — WARFARIN SODIUM 5 MG PO TABS: ORAL_TABLET | ORAL | 2 refills | Status: DC

## 2019-09-24 NOTE — Telephone Encounter (Signed)
Called patient and provided results of INR collected in our lab (2.2). Will continue SAME regimen of 1 tablet MWF; 1 and 1/2 tablets all other days. RTC on 3-MAR-21 at 4:30pm for repeat INR. Refill being sent to his pharmacy per his request.

## 2019-10-22 ENCOUNTER — Ambulatory Visit (INDEPENDENT_AMBULATORY_CARE_PROVIDER_SITE_OTHER): Payer: Self-pay | Admitting: Pharmacist

## 2019-10-22 DIAGNOSIS — I2699 Other pulmonary embolism without acute cor pulmonale: Secondary | ICD-10-CM

## 2019-10-22 DIAGNOSIS — Z86718 Personal history of other venous thrombosis and embolism: Secondary | ICD-10-CM

## 2019-10-22 DIAGNOSIS — Z5181 Encounter for therapeutic drug level monitoring: Secondary | ICD-10-CM

## 2019-10-22 DIAGNOSIS — Z7901 Long term (current) use of anticoagulants: Secondary | ICD-10-CM

## 2019-10-22 LAB — POCT INR: INR: 1.6 — AB (ref 2.0–3.0)

## 2019-10-22 MED ORDER — WARFARIN SODIUM 5 MG PO TABS: 7.5000 mg | ORAL_TABLET | Freq: Every day | ORAL | 2 refills | Status: DC

## 2019-10-22 NOTE — Progress Notes (Signed)
Anticoagulation Management Richard [name] is a 44 y.o. male who reports to the clinic for monitoring of warfarin treatment.    Indication: DVT, History of (resolved); PE, History of (resolved); Current long term continued use of anticoagulant.   Duration: indefinite Supervising physician: [facility] Visit History: Patient does not report signs/symptoms of bleeding or thromboembolism  Other recent changes: No diet, medications, lifestyle changes endorsed.  Anticoagulation Episode Summary    Current INR goal:  2.0-3.0  TTR:  52.2 % (7.5 y)  Next INR check:  11/19/2019  INR from last check:  1.6 (10/22/2019)  Weekly max warfarin dose:    Target end date:  Indefinite  INR check location:  Anticoagulation Clinic  Preferred lab:    Send INR reminders to:     Indications   Pulmonary embolism and infarction ([place]) [I26.99] D V T (Resolved) [I80.299] Long term (current) use of anticoagulants [Z79.01]       Comments:          No Known Allergies  Current Outpatient Medications:  .  cetirizine (ZYRTEC) 10 MG tablet, Take 1 tablet (10 mg total) by mouth daily., Disp: 100 tablet, Rfl: 0 .  valACYclovir (VALTREX) 500 MG tablet, Take 1 tablet (500 mg total) by mouth 2 (two) times daily. For three days., Disp: 6 tablet, Rfl: 3 .  warfarin (COUMADIN) 5 MG tablet, Take 1.5 tablets (7.5 mg total) by mouth daily at 6 PM., Disp: 42 tablet, Rfl: 2 Past Medical History:  Diagnosis Date  . D V T 10/09/2010   Qualifier: Diagnosis of  By: Elsworth Soho MD, Leanna Sato     . DVT (deep venous thrombosis) ([place])   . Seasonal allergies    Social History   Socioeconomic History  . Marital status: Single    Spouse name: Not on file  . Number of children: Not on file  . Years of education: Not on file  . Highest education level: Not on file  Occupational History  . Not on file  Tobacco Use  . Smoking status: Former Smoker    Years: 1.00    Types: Cigarettes, Cigars    Quit  date: 04/05/2012    Years since quitting: 7.5  . Smokeless tobacco: Never Used  Substance and Sexual Activity  . Alcohol use: No    Alcohol/week: 0.0 standard drinks  . Drug use: No  . Sexual activity: Not on file  Other Topics Concern  . Not on file  Social History Narrative  . Not on file   Social Determinants of Health   Financial Resource Strain:   . Difficulty of Paying Living Expenses: Not on file  Food Insecurity:   . Worried About Charity fundraiser in the Last Year: Not on file  . Ran Out of Food in the Last Year: Not on file  Transportation Needs:   . Lack of Transportation (Medical): Not on file  . Lack of Transportation (Non-Medical): Not on file  Physical Activity:   . Days of Exercise per Week: Not on file  . Minutes of Exercise per Session: Not on file  Stress:   . Feeling of Stress : Not on file  Social Connections:   . Frequency of Communication with Friends and Family: Not on file  . Frequency of Social Gatherings with Friends and Family: Not on file  . Attends Religious Services: Not on file  . Active Member of Clubs or Organizations: Not on file  . Attends Archivist Meetings:  Not on file  . Marital Status: Not on file   Family History  Problem Relation Age of Onset  . Other Mother   . Other Father   . Pulmonary embolism Maternal Uncle     ASSESSMENT Recent Results: The most recent result is correlated with 45 mg per week: Lab Results  Component Value Date   INR 1.6 (A) 10/22/2019   INR 2.2 09/24/2019   INR 2.1 08/27/2019   PROTIME 20.4 (H) 03/16/2011    Anticoagulation Dosing: Description   Take 1 and 1/2  Tablets of your 5mg  peach-colored warfarin tablets by mouth daily at [facility]      INR today: Subtherapeutic  PLAN Weekly dose was increased by 16% to 52.5 mg per week  There are no Patient Instructions on file for this visit. Patient advised to contact clinic or seek medical attention if signs/symptoms of bleeding or  thromboembolism occur.  Patient verbalized understanding by repeating back information and was advised to contact me if further medication-related questions arise. Patient was also provided an information handout.  Follow-up Return in 4 weeks (on 11/19/2019) for Follow up INR.  11/21/2019, PharmD, CPP  15 minutes spent face-to-face with the patient during the encounter. 50% of time spent on education, including signs/sx bleeding and clotting, as well as food and drug interactions with warfarin. 50% of time was spent on fingerprick POC INR sample collection,processing, results determination, and documentation in [name].

## 2019-11-19 ENCOUNTER — Ambulatory Visit (INDEPENDENT_AMBULATORY_CARE_PROVIDER_SITE_OTHER): Payer: Self-pay | Admitting: Pharmacist

## 2019-11-19 DIAGNOSIS — Z86711 Personal history of pulmonary embolism: Secondary | ICD-10-CM

## 2019-11-19 DIAGNOSIS — Z5181 Encounter for therapeutic drug level monitoring: Secondary | ICD-10-CM

## 2019-11-19 DIAGNOSIS — Z7901 Long term (current) use of anticoagulants: Secondary | ICD-10-CM

## 2019-11-19 DIAGNOSIS — I2699 Other pulmonary embolism without acute cor pulmonale: Secondary | ICD-10-CM

## 2019-11-19 LAB — POCT INR: INR: 3.5 — AB (ref 2.0–3.0)

## 2019-11-19 MED ORDER — WARFARIN SODIUM 5 MG PO TABS: 7.5000 mg | ORAL_TABLET | Freq: Every day | ORAL | 2 refills | Status: DC

## 2019-11-19 NOTE — Patient Instructions (Signed)
Patient instructed to take medications as defined in the Anti-coagulation Track section of this encounter.  Patient instructed to take today's dose Patient instructed to take 1 and 1/2  Tablets of your 5mg  peach-colored warfarin tablets by mouth daily at 6PM--EXCEPT on MONDAYS, take only one (1) tablet on Mondays. Patient verbalized understanding of these instructions.

## 2019-11-19 NOTE — Progress Notes (Signed)
Anticoagulation Management Richard [name] is a 44 y.o. male who reports to the clinic for monitoring of warfarin treatment.    Indication: PE , History of (resolved); Long term current use of anticoagulant warfarin.  Duration: indefinite Supervising physician: Blanch Media  Anticoagulation Clinic Visit History: Patient does not report signs/symptoms of bleeding or thromboembolism  Other recent changes: No diet, medications, lifestyle changes endorsed at this visit.  Anticoagulation Episode Summary    Current INR goal:  2.0-3.0  TTR:  52.2 % (7.6 y)  Next INR check:  12/17/2019  INR from last check:  3.5 (11/19/2019)  Weekly max warfarin dose:    Target end date:  Indefinite  INR check location:  Anticoagulation Clinic  Preferred lab:    Send INR reminders to:     Indications   Pulmonary embolism and infarction ([facility]) [I26.99] D V T (Resolved) [I80.299] Long term (current) use of anticoagulants [Z79.01]       Comments:          No Known Allergies  Current Outpatient Medications:  .  cetirizine (ZYRTEC) 10 MG tablet, Take 1 tablet (10 mg total) by mouth daily., Disp: 100 tablet, Rfl: 0 .  valACYclovir (VALTREX) 500 MG tablet, Take 1 tablet (500 mg total) by mouth 2 (two) times daily. For three days., Disp: 6 tablet, Rfl: 3 .  warfarin (COUMADIN) 5 MG tablet, Take 1.5 tablets (7.5 mg total) by mouth daily at 4 PM., Disp: 40 tablet, Rfl: 2 Past Medical History:  Diagnosis Date  . D V T 10/09/2010   Qualifier: Diagnosis of  By: [name] MD, Comer Locket     . DVT (deep venous thrombosis) ([facility])   . Seasonal allergies    Social History   Socioeconomic History  . Marital status: Single    Spouse name: Not on file  . Number of children: Not on file  . Years of education: Not on file  . Highest education level: Not on file  Occupational History  . Not on file  Tobacco Use  . Smoking status: Former Smoker    Years: 1.00    Types: Cigarettes, Cigars    Quit date: 04/05/2012   Years since quitting: 7.6  . Smokeless tobacco: Never Used  Substance and Sexual Activity  . Alcohol use: No    Alcohol/week: 0.0 standard drinks  . Drug use: No  . Sexual activity: Not on file  Other Topics Concern  . Not on file  Social History Narrative  . Not on file   Social Determinants of Health   Financial Resource Strain:   . Difficulty of Paying Living Expenses:   Food Insecurity:   . Worried About Programme researcher, broadcasting/film/video in the Last Year:   . Barista in the Last Year:   Transportation Needs:   . Freight forwarder (Medical):   Marland Kitchen Lack of Transportation (Non-Medical):   Physical Activity:   . Days of Exercise per Week:   . Minutes of Exercise per Session:   Stress:   . Feeling of Stress :   Social Connections:   . Frequency of Communication with Friends and Family:   . Frequency of Social Gatherings with Friends and Family:   . Attends Religious Services:   . Active Member of Clubs or Organizations:   . Attends Banker Meetings:   Marland Kitchen Marital Status:    Family History  Problem Relation Age of Onset  . Other Mother   . Other Father   .  Pulmonary embolism Maternal Uncle     ASSESSMENT Recent Results: The most recent result is correlated with 52.5 mg per week: Lab Results  Component Value Date   INR 3.5 (A) 11/19/2019   INR 1.6 (A) 10/22/2019   INR 2.2 09/24/2019   PROTIME 20.4 (H) 03/16/2011    Anticoagulation Dosing: Description   Take 1 and 1/2  Tablets of your 5mg  peach-colored warfarin tablets by mouth daily at 6PM--EXCEPT on MONDAYS, take only one (1) tablet on Mondays.       INR today: Supratherapeutic  PLAN Weekly dose was decreased by 5% to 50 mg per week  Patient Instructions  Patient instructed to take medications as defined in the Anti-coagulation Track section of this encounter.  Patient instructed to take today's dose Patient instructed to take 1 and 1/2  Tablets of your 5mg  peach-colored warfarin tablets by  mouth daily at 6PM--EXCEPT on MONDAYS, take only one (1) tablet on Mondays. Patient verbalized understanding of these instructions.    Patient advised to contact clinic or seek medical attention if signs/symptoms of bleeding or thromboembolism occur.  Patient verbalized understanding by repeating back information and was advised to contact me if further medication-related questions arise. Patient was also provided an information handout.  Follow-up Return in 4 weeks (on 12/17/2019) for Follow up INR.  [name], PharmD, CPP  15 minutes spent face-to-face with the patient during the encounter. 50% of time spent on education, including signs/sx bleeding and clotting, as well as food and drug interactions with warfarin. 50% of time was spent on fingerprick POC INR sample collection,processing, results determination, and documentation in [email or web address]

## 2019-11-21 ENCOUNTER — Other Ambulatory Visit: Payer: Self-pay | Admitting: Internal Medicine

## 2019-11-21 DIAGNOSIS — B029 Zoster without complications: Secondary | ICD-10-CM | POA: Diagnosis not present

## 2019-11-21 DIAGNOSIS — R768 Other specified abnormal immunological findings in serum: Secondary | ICD-10-CM

## 2019-11-21 DIAGNOSIS — R21 Rash and other nonspecific skin eruption: Secondary | ICD-10-CM | POA: Diagnosis not present

## 2019-11-21 MED ORDER — VALACYCLOVIR HCL 500 MG PO TABS: 500.0000 mg | ORAL_TABLET | Freq: Two times a day (BID) | ORAL | 3 refills | Status: DC

## 2019-11-21 NOTE — Telephone Encounter (Signed)
Need refill on valACYclovir (VALTREX) 500 MG tablet  ;[phone]  [facility] [address], [place] - [address]

## 2019-11-21 NOTE — Telephone Encounter (Signed)
   Reason for call:   I received a call from Mr. Breckin Savannah at 5:30 PM indicating need for refill on his valacyclovir.   Pertinent Data:   Mr.Granlund states that he had contacted [facility] multiple times today for refill for his valacyclovir and mentions that he needs another prescription as he is currently experiencing an outbreak. He mentions he has recurrent outbreak episodes of his genital herpes treated well with Valtrex and requests that his refill be sent over right away as he is in significant discomfort.   Assessment / Plan / Recommendations:   Chart review confirms previous script with multiple refills for recurrent genital herpes. Refill for Valtrex sent to pharmacy.  As always, pt is advised that if symptoms worsen or new symptoms arise, they should go to an urgent care facility or to to ER for further evaluation.   Theotis Barrio, MD   11/21/2019, 5:53 PM

## 2019-12-17 ENCOUNTER — Ambulatory Visit (INDEPENDENT_AMBULATORY_CARE_PROVIDER_SITE_OTHER): Payer: Self-pay | Admitting: Pharmacist

## 2019-12-17 DIAGNOSIS — I2699 Other pulmonary embolism without acute cor pulmonale: Secondary | ICD-10-CM

## 2019-12-17 DIAGNOSIS — Z7901 Long term (current) use of anticoagulants: Secondary | ICD-10-CM

## 2019-12-17 LAB — POCT INR: INR: 2.1 (ref 2.0–3.0)

## 2019-12-17 NOTE — Patient Instructions (Signed)
Patient instructed to take medications as defined in the Anti-coagulation Track section of this encounter.  Patient instructed to take today's dose.  Patient instructed to take  1 and 1/2  Tablets of your 5mg  peach-colored warfarin tablets by mouth daily at 6PM--EXCEPT on MONDAYS, take only one (1) tablet on Mondays.  Patient verbalized understanding of these instructions.

## 2019-12-17 NOTE — Progress Notes (Signed)
Anticoagulation Management Richard [name] is a 44 y.o. male who reports to the clinic for monitoring of warfarin treatment.    Indication: PE, History of (resolved); DVT, History of (resolved); Long term current use of anticoagulant, warfarin.  Duration: indefinite Supervising physician: Blanch Media  Anticoagulation Clinic Visit History: Patient does not report signs/symptoms of bleeding or thromboembolism  Other recent changes: No diet, medications, lifestyle changes endorsed by the patient at this visit.  Anticoagulation Episode Summary    Current INR goal:  2.0-3.0  TTR:  52.3 % (7.6 y)  Next INR check:  01/14/2020  INR from last check:  2.1 (12/17/2019)  Weekly max warfarin dose:    Target end date:  Indefinite  INR check location:  Anticoagulation Clinic  Preferred lab:    Send INR reminders to:     Indications   Pulmonary embolism and infarction ([facility]) [I26.99] D V T (Resolved) [I80.299] Long term (current) use of anticoagulants [Z79.01]       Comments:          No Known Allergies  Current Outpatient Medications:  .  cetirizine (ZYRTEC) 10 MG tablet, Take 1 tablet (10 mg total) by mouth daily., Disp: 100 tablet, Rfl: 0 .  valACYclovir (VALTREX) 500 MG tablet, Take 1 tablet (500 mg total) by mouth 2 (two) times daily. For three days., Disp: 6 tablet, Rfl: 3 .  warfarin (COUMADIN) 5 MG tablet, Take 1.5 tablets (7.5 mg total) by mouth daily at 4 PM., Disp: 40 tablet, Rfl: 2 Past Medical History:  Diagnosis Date  . D V T 10/09/2010   Qualifier: Diagnosis of  By: [name] MD, Comer Locket     . DVT (deep venous thrombosis) ([facility])   . Seasonal allergies    Social History   Socioeconomic History  . Marital status: Single    Spouse name: Not on file  . Number of children: Not on file  . Years of education: Not on file  . Highest education level: Not on file  Occupational History  . Not on file  Tobacco Use  . Smoking status: Former Smoker    Years: 1.00    Types:  Cigarettes, Cigars    Quit date: 04/05/2012    Years since quitting: 7.7  . Smokeless tobacco: Never Used  Substance and Sexual Activity  . Alcohol use: No    Alcohol/week: 0.0 standard drinks  . Drug use: No  . Sexual activity: Not on file  Other Topics Concern  . Not on file  Social History Narrative  . Not on file   Social Determinants of Health   Financial Resource Strain:   . Difficulty of Paying Living Expenses:   Food Insecurity:   . Worried About Programme researcher, broadcasting/film/video in the Last Year:   . Barista in the Last Year:   Transportation Needs:   . Freight forwarder (Medical):   Marland Kitchen Lack of Transportation (Non-Medical):   Physical Activity:   . Days of Exercise per Week:   . Minutes of Exercise per Session:   Stress:   . Feeling of Stress :   Social Connections:   . Frequency of Communication with Friends and Family:   . Frequency of Social Gatherings with Friends and Family:   . Attends Religious Services:   . Active Member of Clubs or Organizations:   . Attends Banker Meetings:   Marland Kitchen Marital Status:    Family History  Problem Relation Age of Onset  . Other  Mother   . Other Father   . Pulmonary embolism Maternal Uncle     ASSESSMENT Recent Results: The most recent result is correlated with 50 mg per week: Lab Results  Component Value Date   INR 2.1 12/17/2019   INR 3.5 (A) 11/19/2019   INR 1.6 (A) 10/22/2019   PROTIME 20.4 (H) 03/16/2011    Anticoagulation Dosing: Description   Take 1 and 1/2  Tablets of your 5mg  peach-colored warfarin tablets by mouth daily at 6PM--EXCEPT on MONDAYS, take only one (1) tablet on Mondays.       INR today: Therapeutic  PLAN Weekly dose was unchanged.   Patient Instructions  Patient instructed to take medications as defined in the Anti-coagulation Track section of this encounter.  Patient instructed to take today's dose.  Patient instructed to take  1 and 1/2  Tablets of your 5mg  peach-colored  warfarin tablets by mouth daily at 6PM--EXCEPT on MONDAYS, take only one (1) tablet on Mondays.  Patient verbalized understanding of these instructions.    Patient advised to contact clinic or seek medical attention if signs/symptoms of bleeding or thromboembolism occur.  Patient verbalized understanding by repeating back information and was advised to contact me if further medication-related questions arise. Patient was also provided an information handout.  Follow-up Return in 4 weeks (on 01/14/2020) for Follow up INR.  [name], PharmD, CPP  15 minutes spent face-to-face with the patient during the encounter. 50% of time spent on education, including signs/sx bleeding and clotting, as well as food and drug interactions with warfarin. 50% of time was spent on fingerprick POC INR sample collection,processing, results determination, and documentation in [email or web address]

## 2019-12-24 ENCOUNTER — Encounter: Payer: Self-pay | Admitting: *Deleted

## 2020-01-08 ENCOUNTER — Telehealth: Payer: Self-pay | Admitting: Internal Medicine

## 2020-01-08 NOTE — Telephone Encounter (Signed)
   Reason for call:   I received a call from [name] at 6:00 PM indicating concern about an antibiotic and his warfarin.   Pertinent Data:   This is a 44 year old male with a history of PE and DVT on warfarin who reports that yesterday he was in an accident at work where he stepped on a nail. He denies any foot pain or bleeding, however reports that it's noticeably sore, there is some redness are the area. He went to a health at work doctor who prescribed him Levaquin for his infection. When he went to pick up prescription he was told that the antibiotic would interfere with his warfarin therapy and was told that he needed a physician to review this. He is currently taking 1.5 tabs of the 5 mg warfarin tablets, except on Mondays when he takes 1 tab. He reports that his next INR check is on Monday.   Assessment / Plan / Recommendations:   Levequin may enhance the anticoagulant effect of warfarin, recommendation is to monitor therapy while this is being given, there is no specific contraindication. Discussed that since he has an repeat INR check on Monday we will adjust his medications at that time if needed. Contacted pharmacy and provided permission to dispense this medication. Advised patient that if he develops any symptoms, such as SOB, chest pain, hemoptysis, cough, lower extremity swelling, or other symptoms then to go the the ED.   Will forward note to Dr. Alexandria Lodge to see if closer INR check is required.   As always, pt is advised that if symptoms worsen or new symptoms arise, they should go to an urgent care facility or to to ER for further evaluation.   Claudean Severance, MD   01/08/2020, 6:02 PM

## 2020-01-14 ENCOUNTER — Ambulatory Visit (INDEPENDENT_AMBULATORY_CARE_PROVIDER_SITE_OTHER): Payer: Self-pay | Admitting: Pharmacist

## 2020-01-14 DIAGNOSIS — I2699 Other pulmonary embolism without acute cor pulmonale: Secondary | ICD-10-CM

## 2020-01-14 DIAGNOSIS — Z7901 Long term (current) use of anticoagulants: Secondary | ICD-10-CM

## 2020-01-14 LAB — POCT INR: INR: 2.9 (ref 2.0–3.0)

## 2020-01-14 MED ORDER — WARFARIN SODIUM 5 MG PO TABS: ORAL_TABLET | ORAL | 2 refills | Status: DC

## 2020-01-14 NOTE — Progress Notes (Signed)
Anticoagulation Management Richard [name] is a 44 y.o. male who reports to the clinic for monitoring of warfarin treatment.    Indication: Pulmonary embolism with infarction; DVT, History of (resolved); Long term current use of anticoagulant.    Duration: indefinite Supervising physician: Aldine Contes  Anticoagulation Clinic Visit History: Patient does not report signs/symptoms of bleeding or thromboembolism  Other recent changes: No diet, medications, lifestyle changes.  Anticoagulation Episode Summary    Current INR goal:  2.0-3.0  TTR:  52.8 % (7.7 y)  Next INR check:  02/11/2020  INR from last check:  2.9 (01/14/2020)  Weekly max warfarin dose:    Target end date:  Indefinite  INR check location:  Anticoagulation Clinic  Preferred lab:    Send INR reminders to:     Indications   Pulmonary embolism and infarction ([place]) [I26.99] D V T (Resolved) [I80.299] Long term (current) use of anticoagulants [Z79.01]       Comments:          No Known Allergies  Current Outpatient Medications:  .  cetirizine (ZYRTEC) 10 MG tablet, Take 1 tablet (10 mg total) by mouth daily., Disp: 100 tablet, Rfl: 0 .  valACYclovir (VALTREX) 500 MG tablet, Take 1 tablet (500 mg total) by mouth 2 (two) times daily. For three days., Disp: 6 tablet, Rfl: 3 .  warfarin (COUMADIN) 5 MG tablet, Take 1.5 tablets (7.5 mg total) by mouth daily at 4 PM., Disp: 40 tablet, Rfl: 2 Past Medical History:  Diagnosis Date  . D V T 10/09/2010   Qualifier: Diagnosis of  By: Elsworth Soho MD, Leanna Sato     . DVT (deep venous thrombosis) ([place])   . Seasonal allergies    Social History   Socioeconomic History  . Marital status: Single    Spouse name: Not on file  . Number of children: Not on file  . Years of education: Not on file  . Highest education level: Not on file  Occupational History  . Not on file  Tobacco Use  . Smoking status: Former Smoker    Years: 1.00    Types: Cigarettes, Cigars    Quit date:  04/05/2012    Years since quitting: 7.7  . Smokeless tobacco: Never Used  Substance and Sexual Activity  . Alcohol use: No    Alcohol/week: 0.0 standard drinks  . Drug use: No  . Sexual activity: Not on file  Other Topics Concern  . Not on file  Social History Narrative  . Not on file   Social Determinants of Health   Financial Resource Strain:   . Difficulty of Paying Living Expenses:   Food Insecurity:   . Worried About Charity fundraiser in the Last Year:   . Arboriculturist in the Last Year:   Transportation Needs:   . Film/video editor (Medical):   Marland Kitchen Lack of Transportation (Non-Medical):   Physical Activity:   . Days of Exercise per Week:   . Minutes of Exercise per Session:   Stress:   . Feeling of Stress :   Social Connections:   . Frequency of Communication with Friends and Family:   . Frequency of Social Gatherings with Friends and Family:   . Attends Religious Services:   . Active Member of Clubs or Organizations:   . Attends Archivist Meetings:   Marland Kitchen Marital Status:    Family History  Problem Relation Age of Onset  . Other Mother   . Other Father   .  Pulmonary embolism Maternal Uncle     ASSESSMENT Recent Results: The most recent result is correlated with 50 mg per week: Lab Results  Component Value Date   INR 2.9 01/14/2020   INR 2.1 12/17/2019   INR 3.5 (A) 11/19/2019   PROTIME 20.4 (H) 03/16/2011    Anticoagulation Dosing: Description   Take 1 and 1/2  Tablets of your 5mg  peach-colored warfarin tablets by mouth daily at 6PM--EXCEPT on MONDAYS and THURSDAYS, take only one (1) tablet on Mondays and Thursdays.       INR today: Therapeutic  PLAN Weekly dose was decreased by 5% to 47.5 mg per week  Patient Instructions  Patient instructed to take medications as defined in the Anti-coagulation Track section of this encounter.  Patient instructed to take today's dose.  Patient instructed to take 1 and 1/2  Tablets of your 5mg   peach-colored warfarin tablets by mouth daily at 6PM--EXCEPT on MONDAYS and THURSDAYS, take only one (1) tablet on Mondays and Thursdays.   Patient verbalized understanding of these instructions.    Patient advised to contact clinic or seek medical attention if signs/symptoms of bleeding or thromboembolism occur.  Patient verbalized understanding by repeating back information and was advised to contact me if further medication-related questions arise. Patient was also provided an information handout.  Follow-up Return in 4 weeks (on 02/11/2020) for Follow up INR.  01-23-1979, PharmD, CPP  15 minutes spent face-to-face with the patient during the encounter. 50% of time spent on education, including signs/sx bleeding and clotting, as well as food and drug interactions with warfarin. 50% of time was spent on fingerprick POC INR sample collection,processing, results determination, and documentation in 02/13/2020.

## 2020-01-14 NOTE — Patient Instructions (Signed)
Patient instructed to take medications as defined in the Anti-coagulation Track section of this encounter.  Patient instructed to take today's dose.  Patient instructed to take 1 and 1/2  Tablets of your 5mg  peach-colored warfarin tablets by mouth daily at 6PM--EXCEPT on MONDAYS and THURSDAYS, take only one (1) tablet on Mondays and Thursdays.   Patient verbalized understanding of these instructions.

## 2020-01-17 NOTE — Progress Notes (Signed)
INTERNAL MEDICINE TEACHING ATTENDING ADDENDUM - Niguel Moure M.D  Duration- indefinite, Indication- recurrent PE, DVT, INR- therapeutic. Agree with pharmacy recommendations as outlined in their note.      

## 2020-02-07 ENCOUNTER — Other Ambulatory Visit: Payer: Self-pay | Admitting: Internal Medicine

## 2020-02-07 DIAGNOSIS — R768 Other specified abnormal immunological findings in serum: Secondary | ICD-10-CM

## 2020-02-07 MED ORDER — VALACYCLOVIR HCL 500 MG PO TABS: 500.0000 mg | ORAL_TABLET | Freq: Two times a day (BID) | ORAL | 3 refills | Status: DC

## 2020-02-11 ENCOUNTER — Ambulatory Visit (INDEPENDENT_AMBULATORY_CARE_PROVIDER_SITE_OTHER): Payer: Self-pay | Admitting: Pharmacist

## 2020-02-11 DIAGNOSIS — Z7901 Long term (current) use of anticoagulants: Secondary | ICD-10-CM

## 2020-02-11 DIAGNOSIS — I2699 Other pulmonary embolism without acute cor pulmonale: Secondary | ICD-10-CM

## 2020-02-11 LAB — POCT INR: INR: 2.6 (ref 2.0–3.0)

## 2020-02-11 MED ORDER — WARFARIN SODIUM 5 MG PO TABS: ORAL_TABLET | ORAL | 2 refills | Status: DC

## 2020-02-11 NOTE — Progress Notes (Signed)
Anticoagulation Management Richard [name] is a 44 y.o. male who reports to the clinic for monitoring of warfarin treatment.    Indication: PE, pulmonary embolism with infarction (History of; resolved); long term current use of anticoagulant warfarin with target INR 2.0 - 3.0.  Duration: indefinite Supervising physician: [name]  Anticoagulation Clinic Visit History: Patient does not report signs/symptoms of bleeding or thromboembolism  Other recent changes: No diet, medications, lifestyle changes endorsed by the patient at this visit.  Anticoagulation Episode Summary    Current INR goal:  2.0-3.0  TTR:  53.2 % (7.8 y)  Next INR check:  03/10/2020  INR from last check:  2.6 (02/11/2020)  Weekly max warfarin dose:    Target end date:  Indefinite  INR check location:  Anticoagulation Clinic  Preferred lab:    Send INR reminders to:     Indications   Pulmonary embolism and infarction (HCC) [I26.99] D V T (Resolved) [I80.299] Long term (current) use of anticoagulants [Z79.01]       Comments:          No Known Allergies  Current Outpatient Medications:  .  cetirizine (ZYRTEC) 10 MG tablet, Take 1 tablet (10 mg total) by mouth daily., Disp: 100 tablet, Rfl: 0 .  valACYclovir (VALTREX) 500 MG tablet, Take 1 tablet (500 mg total) by mouth 2 (two) times daily. For three days., Disp: 6 tablet, Rfl: 3 .  warfarin (COUMADIN) 5 MG tablet, Take one-and-one-half tablets of your 5mg  peach-colored warfarin tablets--all days, EXCEPT on Mondays and Thursdays, take only one (1) tablet on Mondays and Thursdays., Disp: 40 tablet, Rfl: 2 Past Medical History:  Diagnosis Date  . D V T 10/09/2010   Qualifier: Diagnosis of  By: 10/11/2010 MD, [name]     . DVT (deep venous thrombosis) (HCC)   . Seasonal allergies    Social History   Socioeconomic History  . Marital status: Single    Spouse name: Not on file  . Number of children: Not on file  . Years of education: Not on file  . Highest  education level: Not on file  Occupational History  . Not on file  Tobacco Use  . Smoking status: Former Smoker    Years: 1.00    Types: Cigarettes, Cigars    Quit date: 04/05/2012    Years since quitting: 7.8  . Smokeless tobacco: Never Used  Substance and Sexual Activity  . Alcohol use: No    Alcohol/week: 0.0 standard drinks  . Drug use: No  . Sexual activity: Not on file  Other Topics Concern  . Not on file  Social History Narrative  . Not on file   Social Determinants of Health   Financial Resource Strain:   . Difficulty of Paying Living Expenses:   Food Insecurity:   . Worried About 04/07/2012 in the Last Year:   . Programme researcher, broadcasting/film/video in the Last Year:   Transportation Needs:   . Barista (Medical):   Freight forwarder Lack of Transportation (Non-Medical):   Physical Activity:   . Days of Exercise per Week:   . Minutes of Exercise per Session:   Stress:   . Feeling of Stress :   Social Connections:   . Frequency of Communication with Friends and Family:   . Frequency of Social Gatherings with Friends and Family:   . Attends Religious Services:   . Active Member of Clubs or Organizations:   . Attends [employer] Meetings:   .  Marital Status:    Family History  Problem Relation Age of Onset  . Other Mother   . Other Father   . Pulmonary embolism Maternal Uncle     ASSESSMENT Recent Results: The most recent result is correlated with 47.5 mg per week: Lab Results  Component Value Date   INR 2.6 02/11/2020   INR 2.9 01/14/2020   INR 2.1 12/17/2019   PROTIME 20.4 (H) 03/16/2011    Anticoagulation Dosing: Description   Take 1 and 1/2  Tablets of your 5mg  peach-colored warfarin tablets by mouth daily at 6PM--EXCEPT on MONDAYS and THURSDAYS, take only one (1) tablet on Mondays and Thursdays.       INR today: Therapeutic  PLAN Weekly dose was unchanged.   Patient Instructions  Patient instructed to take medications as defined in the  Anti-coagulation Track section of this encounter.  Patient instructed to take today's dose.  Patient instructed to take 1 and 1/2  Tablets of your 5mg  peach-colored warfarin tablets by mouth daily at 6PM--EXCEPT on MONDAYS and THURSDAYS, take only one (1) tablet on Mondays and Thursdays. Patient verbalized understanding of these instructions.    Patient advised to contact clinic or seek medical attention if signs/symptoms of bleeding or thromboembolism occur.  Patient verbalized understanding by repeating back information and was advised to contact me if further medication-related questions arise. Patient was also provided an information handout.  Follow-up Return in 4 weeks (on 03/10/2020) for Follow up INR.  [name], PharmD, CPP  15 minutes spent face-to-face with the patient during the encounter. 50% of time spent on education, including signs/sx bleeding and clotting, as well as food and drug interactions with warfarin. 50% of time was spent on fingerprick POC INR sample collection,processing, results determination, and documentation in [email or web address]/.

## 2020-02-11 NOTE — Patient Instructions (Signed)
Patient instructed to take medications as defined in the Anti-coagulation Track section of this encounter.  Patient instructed to take today's dose.  Patient instructed to take 1 and 1/2  Tablets of your 5mg peach-colored warfarin tablets by mouth daily at 6PM--EXCEPT on MONDAYS and THURSDAYS, take only one (1) tablet on Mondays and Thursdays.   Patient verbalized understanding of these instructions.   

## 2020-02-12 NOTE — Progress Notes (Signed)
INTERNAL MEDICINE TEACHING ATTENDING ADDENDUM ° °I agree with pharmacy recommendations as outlined in their note.  ° °-Champagne Paletta MD ° °

## 2020-03-10 ENCOUNTER — Ambulatory Visit: Payer: BC Managed Care – PPO | Admitting: Pharmacist

## 2020-03-10 DIAGNOSIS — Z7901 Long term (current) use of anticoagulants: Secondary | ICD-10-CM

## 2020-03-10 DIAGNOSIS — I2699 Other pulmonary embolism without acute cor pulmonale: Secondary | ICD-10-CM

## 2020-03-10 LAB — POCT INR: INR: 2.1 (ref 2.0–3.0)

## 2020-03-10 MED ORDER — WARFARIN SODIUM 5 MG PO TABS: ORAL_TABLET | ORAL | 2 refills | Status: DC

## 2020-03-10 NOTE — Progress Notes (Signed)
Anticoagulation Management Richard [name] is a 44 y.o. male who reports to the clinic for monitoring of warfarin treatment.    Indication: PE with infarction, history of; Long term current use of anticoagulant, warfarin.  Duration: indefinite Supervising physician: Charissa Bash, MD  Anticoagulation Clinic Visit History: Patient does not report signs/symptoms of bleeding or thromboembolism  Other recent changes: No diet, medications, lifestyle changes.  Anticoagulation Episode Summary    Current INR goal:  2.0-3.0  TTR:  53.7 % (7.9 y)  Next INR check:  04/07/2020  INR from last check:  2.1 (03/10/2020)  Weekly max warfarin dose:    Target end date:  Indefinite  INR check location:  Anticoagulation Clinic  Preferred lab:    Send INR reminders to:     Indications   Pulmonary embolism and infarction (HCC) [I26.99] D V T (Resolved) [I80.299] Long term (current) use of anticoagulants [Z79.01]       Comments:          No Known Allergies  Current Outpatient Medications:  .  cetirizine (ZYRTEC) 10 MG tablet, Take 1 tablet (10 mg total) by mouth daily., Disp: 100 tablet, Rfl: 0 .  warfarin (COUMADIN) 5 MG tablet, Take one-and-one-half tablets of your 5mg  peach-colored warfarin tablets--all days, EXCEPT on Mondays, take only one (1) tablet on Mondays., Disp: 40 tablet, Rfl: 2 .  valACYclovir (VALTREX) 500 MG tablet, Take 1 tablet (500 mg total) by mouth 2 (two) times daily. For three days. (Patient not taking: Reported on 03/10/2020), Disp: 6 tablet, Rfl: 3 Past Medical History:  Diagnosis Date  . D V T 10/09/2010   Qualifier: Diagnosis of  By: 10/11/2010 MD, [name]     . DVT (deep venous thrombosis) (HCC)   . Seasonal allergies    Social History   Socioeconomic History  . Marital status: Single    Spouse name: Not on file  . Number of children: Not on file  . Years of education: Not on file  . Highest education level: Not on file  Occupational History  . Not on file  Tobacco  Use  . Smoking status: Former Smoker    Years: 1.00    Types: Cigarettes, Cigars    Quit date: 04/05/2012    Years since quitting: 7.9  . Smokeless tobacco: Never Used  Substance and Sexual Activity  . Alcohol use: No    Alcohol/week: 0.0 standard drinks  . Drug use: No  . Sexual activity: Not on file  Other Topics Concern  . Not on file  Social History Narrative  . Not on file   Social Determinants of Health   Financial Resource Strain:   . Difficulty of Paying Living Expenses:   Food Insecurity:   . Worried About 04/07/2012 in the Last Year:   . Programme researcher, broadcasting/film/video in the Last Year:   Transportation Needs:   . Barista (Medical):   Freight forwarder Lack of Transportation (Non-Medical):   Physical Activity:   . Days of Exercise per Week:   . Minutes of Exercise per Session:   Stress:   . Feeling of Stress :   Social Connections:   . Frequency of Communication with Friends and Family:   . Frequency of Social Gatherings with Friends and Family:   . Attends Religious Services:   . Active Member of Clubs or Organizations:   . Attends [employer] Meetings:   Banker Marital Status:    Family History  Problem Relation Age  of Onset  . Other Mother   . Other Father   . Pulmonary embolism Maternal Uncle     ASSESSMENT Recent Results: The most recent result is correlated with 47.5 mg per week: Lab Results  Component Value Date   INR 2.1 03/10/2020   INR 2.6 02/11/2020   INR 2.9 01/14/2020   PROTIME 20.4 (H) 03/16/2011    Anticoagulation Dosing: Description   Take 1 and 1/2  Tablets of your 5mg  peach-colored warfarin tablets by mouth daily at 6PM--EXCEPT on MONDAYS.Take only one (1) tablet on Mondays.      INR today: Therapeutic  PLAN Weekly dose was increased by 5% to 50 mg per week  Patient Instructions  Patient instructed to take medications as defined in the Anti-coagulation Track section of this encounter.  Patient instructed to take today's  dose.  Patient instructed to take 1 and 1/2  Tablets of your 5mg  peach-colored warfarin tablets by mouth daily at 6PM--EXCEPT on MONDAYS.Take only one (1) tablet on Mondays. Patient verbalized understanding of these instructions.    Patient advised to contact clinic or seek medical attention if signs/symptoms of bleeding or thromboembolism occur.  Patient verbalized understanding by repeating back information and was advised to contact me if further medication-related questions arise. Patient was also provided an information handout.  Follow-up Return in 4 weeks (on 04/07/2020) for Follow up INR.  11-19-1972, PharmD, CPP  15 minutes spent face-to-face with the patient during the encounter. 50% of time spent on education, including signs/sx bleeding and clotting, as well as food and drug interactions with warfarin. 50% of time was spent on fingerprick POC INR sample collection,processing, results determination, and documentation in 04/09/2020.

## 2020-03-10 NOTE — Patient Instructions (Signed)
Patient instructed to take medications as defined in the Anti-coagulation Track section of this encounter.  Patient instructed to take today's dose.  Patient instructed to take 1 and 1/2  Tablets of your 5mg  peach-colored warfarin tablets by mouth daily at 6PM--EXCEPT on MONDAYS.Take only one (1) tablet on Mondays. Patient verbalized understanding of these instructions.

## 2020-03-11 NOTE — Progress Notes (Signed)
Agree with Dr. Groce's plan as documented. 

## 2020-04-07 ENCOUNTER — Ambulatory Visit (INDEPENDENT_AMBULATORY_CARE_PROVIDER_SITE_OTHER): Payer: BC Managed Care – PPO | Admitting: Pharmacist

## 2020-04-07 DIAGNOSIS — Z7901 Long term (current) use of anticoagulants: Secondary | ICD-10-CM | POA: Diagnosis not present

## 2020-04-07 DIAGNOSIS — I2699 Other pulmonary embolism without acute cor pulmonale: Secondary | ICD-10-CM

## 2020-04-07 LAB — POCT INR: INR: 2.1 (ref 2.0–3.0)

## 2020-04-07 MED ORDER — WARFARIN SODIUM 5 MG PO TABS: ORAL_TABLET | ORAL | 3 refills | Status: DC

## 2020-04-07 NOTE — Patient Instructions (Signed)
Patient instructed to take medications as defined in the Anti-coagulation Track section of this encounter.  Patient instructed to take today's dose.  Patient instructed to take 1 and 1/2  Tablets of your 5mg  peach-colored warfarin tablets by mouth daily at 6PM--EXCEPT on MONDAYS and THURSDAYS, take two (2) tablets on Mondays and Thursdays.   Patient verbalized understanding of these instructions.

## 2020-04-07 NOTE — Progress Notes (Signed)
Anticoagulation Management Richard [name] is a 44 y.o. male who reports to the clinic for monitoring of warfarin treatment.    Indication: Pulmonary embolism with infarction (History of; resolved); Long term current use of warfarin to maintain INR 2.0 - 3.0 Duration: indefinite Supervising physician: Carlynn Purl  Anticoagulation Clinic Visit History: Patient does not report signs/symptoms of bleeding or thromboembolism  Other recent changes: No diet, medications, lifestyle changes endorsed by the patient at this visit.  Anticoagulation Episode Summary    Current INR goal:  2.0-3.0  TTR:  54.2 % (8 y)  Next INR check:  05/05/2020  INR from last check:    Weekly max warfarin dose:    Target end date:  Indefinite  INR check location:  Anticoagulation Clinic  Preferred lab:    Send INR reminders to:     Indications   Pulmonary embolism and infarction (HCC) [I26.99] D V T (Resolved) [I80.299] Long term (current) use of anticoagulants [Z79.01]       Comments:          No Known Allergies  Current Outpatient Medications:  .  cetirizine (ZYRTEC) 10 MG tablet, Take 1 tablet (10 mg total) by mouth daily., Disp: 100 tablet, Rfl: 0 .  valACYclovir (VALTREX) 500 MG tablet, Take 1 tablet (500 mg total) by mouth 2 (two) times daily. For three days., Disp: 6 tablet, Rfl: 3 .  warfarin (COUMADIN) 5 MG tablet, Take one-and-one-half tablets of your 5mg  peach-colored warfarin tablets--all days, EXCEPT on Mondays, take only one (1) tablet on Mondays., Disp: 40 tablet, Rfl: 2 Past Medical History:  Diagnosis Date  . D V T 10/09/2010   Qualifier: Diagnosis of  By: 10/11/2010 MD, [name]     . DVT (deep venous thrombosis) (HCC)   . Seasonal allergies    Social History   Socioeconomic History  . Marital status: Single    Spouse name: Not on file  . Number of children: Not on file  . Years of education: Not on file  . Highest education level: Not on file  Occupational History  . Not on file   Tobacco Use  . Smoking status: Former Smoker    Years: 1.00    Types: Cigarettes, Cigars    Quit date: 04/05/2012    Years since quitting: 8.0  . Smokeless tobacco: Never Used  Substance and Sexual Activity  . Alcohol use: No    Alcohol/week: 0.0 standard drinks  . Drug use: No  . Sexual activity: Not on file  Other Topics Concern  . Not on file  Social History Narrative  . Not on file   Social Determinants of Health   Financial Resource Strain:   . Difficulty of Paying Living Expenses:   Food Insecurity:   . Worried About 04/07/2012 in the Last Year:   . Programme researcher, broadcasting/film/video in the Last Year:   Transportation Needs:   . Barista (Medical):   Freight forwarder Lack of Transportation (Non-Medical):   Physical Activity:   . Days of Exercise per Week:   . Minutes of Exercise per Session:   Stress:   . Feeling of Stress :   Social Connections:   . Frequency of Communication with Friends and Family:   . Frequency of Social Gatherings with Friends and Family:   . Attends Religious Services:   . Active Member of Clubs or Organizations:   . Attends [employer] Meetings:   Banker Marital Status:    Family  History  Problem Relation Age of Onset  . Other Mother   . Other Father   . Pulmonary embolism Maternal Uncle     ASSESSMENT Recent Results: The most recent result is correlated with 50 mg per week: Lab Results  Component Value Date   INR 2.1 04/07/2020   INR 2.1 03/10/2020   INR 2.6 02/11/2020   PROTIME 20.4 (H) 03/16/2011    Anticoagulation Dosing: Description   Take 1 and 1/2  Tablets of your 5mg  peach-colored warfarin tablets by mouth daily at 6PM--EXCEPT on MONDAYS and THURSDAYS, take two (2) tablets on Mondays and Thursdays.       INR today: Therapeutic  PLAN Weekly dose was increased by 15% to 57.5 mg per week  Patient Instructions  Patient instructed to take medications as defined in the Anti-coagulation Track section of this encounter.   Patient instructed to take today's dose.  Patient instructed to take 1 and 1/2  Tablets of your 5mg  peach-colored warfarin tablets by mouth daily at 6PM--EXCEPT on MONDAYS and THURSDAYS, take two (2) tablets on Mondays and Thursdays.   Patient verbalized understanding of these instructions.    Patient advised to contact clinic or seek medical attention if signs/symptoms of bleeding or thromboembolism occur.  Patient verbalized understanding by repeating back information and was advised to contact me if further medication-related questions arise. Patient was also provided an information handout.  Follow-up Return in 4 weeks (on 05/05/2020) for Follow up INR.  01-23-1980, PharmD, CPP  15 minutes spent face-to-face with the patient during the encounter. 50% of time spent on education, including signs/sx bleeding and clotting, as well as food and drug interactions with warfarin. 50% of time was spent on fingerprick POC INR sample collection,processing, results determination, and documentation in 05/07/2020.

## 2020-05-05 ENCOUNTER — Ambulatory Visit (INDEPENDENT_AMBULATORY_CARE_PROVIDER_SITE_OTHER): Payer: BC Managed Care – PPO | Admitting: Pharmacist

## 2020-05-05 ENCOUNTER — Other Ambulatory Visit: Payer: Self-pay

## 2020-05-05 DIAGNOSIS — I2699 Other pulmonary embolism without acute cor pulmonale: Secondary | ICD-10-CM | POA: Diagnosis not present

## 2020-05-05 DIAGNOSIS — Z7901 Long term (current) use of anticoagulants: Secondary | ICD-10-CM

## 2020-05-05 LAB — POCT INR: INR: 1.8 — AB (ref 2.0–3.0)

## 2020-05-05 MED ORDER — WARFARIN SODIUM 5 MG PO TABS: ORAL_TABLET | ORAL | 3 refills | Status: DC

## 2020-05-05 NOTE — Patient Instructions (Signed)
Patient instructed to take medications as defined in the Anti-coagulation Track section of this encounter.  Patient instructed to take today's dose.  Patient instructed to take 1 and 1/2  Tablets of your 5mg  peach-colored warfarin tablets by mouth daily at [facility] on Sundays, Thursdays, and Saturdays. On all other days, take two (2) tablets.  Patient verbalized understanding of these instructions.

## 2020-05-05 NOTE — Progress Notes (Signed)
Anticoagulation Management Richard [name] is a 44 y.o. male who reports to the clinic for monitoring of warfarin treatment.    Indication: PE, History of (resolved); Long term current use of anticoagulant, warfarin.   Duration: indefinite Supervising physician: [name]  Anticoagulation Clinic Visit History: Patient does not report signs/symptoms of bleeding or thromboembolism  Other recent changes: No diet, medications, lifestyle changes.  Anticoagulation Episode Summary    Current INR goal:  2.0-3.0  TTR:  54.0 % (8 y)  Next INR check:  06/02/2020  INR from last check:  1.8 (05/05/2020)  Weekly max warfarin dose:    Target end date:  Indefinite  INR check location:  Anticoagulation Clinic  Preferred lab:    Send INR reminders to:     Indications   Pulmonary embolism and infarction (HCC) [I26.99] D V T (Resolved) [I80.299] Long term (current) use of anticoagulants [Z79.01]       Comments:          No Known Allergies  Current Outpatient Medications:  .  cetirizine (ZYRTEC) 10 MG tablet, Take 1 tablet (10 mg total) by mouth daily., Disp: 100 tablet, Rfl: 0 .  valACYclovir (VALTREX) 500 MG tablet, Take 1 tablet (500 mg total) by mouth 2 (two) times daily. For three days., Disp: 6 tablet, Rfl: 3 .  warfarin (COUMADIN) 5 MG tablet, Take one-and-one-half tablets of your 5mg  peach-colored warfarin tablets on Sundays, Thursdays and Saturdays. All OTHER days,  take two (2) tablets., Disp: 52 tablet, Rfl: 3 Past Medical History:  Diagnosis Date  . D V T 10/09/2010   Qualifier: Diagnosis of  By: 10/11/2010 MD, [name]     . DVT (deep venous thrombosis) (HCC)   . Seasonal allergies    Social History   Socioeconomic History  . Marital status: Single    Spouse name: Not on file  . Number of children: Not on file  . Years of education: Not on file  . Highest education level: Not on file  Occupational History  . Not on file  Tobacco Use  . Smoking status: Former Smoker     Years: 1.00    Types: Cigarettes, Cigars    Quit date: 04/05/2012    Years since quitting: 8.0  . Smokeless tobacco: Never Used  Substance and Sexual Activity  . Alcohol use: No    Alcohol/week: 0.0 standard drinks  . Drug use: No  . Sexual activity: Not on file  Other Topics Concern  . Not on file  Social History Narrative  . Not on file   Social Determinants of Health   Financial Resource Strain:   . Difficulty of Paying Living Expenses: Not on file  Food Insecurity:   . Worried About 04/07/2012 in the Last Year: Not on file  . Ran Out of Food in the Last Year: Not on file  Transportation Needs:   . Lack of Transportation (Medical): Not on file  . Lack of Transportation (Non-Medical): Not on file  Physical Activity:   . Days of Exercise per Week: Not on file  . Minutes of Exercise per Session: Not on file  Stress:   . Feeling of Stress : Not on file  Social Connections:   . Frequency of Communication with Friends and Family: Not on file  . Frequency of Social Gatherings with Friends and Family: Not on file  . Attends Religious Services: Not on file  . Active Member of Clubs or Organizations: Not on file  .  Attends Banker Meetings: Not on file  . Marital Status: Not on file   Family History  Problem Relation Age of Onset  . Other Mother   . Other Father   . Pulmonary embolism Maternal Uncle     ASSESSMENT Recent Results: The most recent result is correlated with 57.5 mg per week: Lab Results  Component Value Date   INR 1.8 (A) 05/05/2020   INR 2.1 04/07/2020   INR 2.1 03/10/2020   PROTIME 20.4 (H) 03/16/2011    Anticoagulation Dosing: Description   Take 1 and 1/2  Tablets of your 5mg  peach-colored warfarin tablets by mouth daily at [facility] on Sundays, Thursdays, and Saturdays. On all other days, take two (2) tablets.       INR today: Subtherapeutic  PLAN Weekly dose was increased by 9% to 62.5 mg per week  Patient Instructions    Patient instructed to take medications as defined in the Anti-coagulation Track section of this encounter.  Patient instructed to take today's dose.  Patient instructed to take 1 and 1/2  Tablets of your 5mg  peach-colored warfarin tablets by mouth daily at [facility] on Sundays, Thursdays, and Saturdays. On all other days, take two (2) tablets.  Patient verbalized understanding of these instructions.    Patient advised to contact clinic or seek medical attention if signs/symptoms of bleeding or thromboembolism occur.  Patient verbalized understanding by repeating back information and was advised to contact me if further medication-related questions arise. Patient was also provided an information handout.  Follow-up Return in 4 weeks (on 06/02/2020).  [birth year]-18-1990, PharmD, CPP  15 minutes spent face-to-face with the patient during the encounter. 50% of time spent on education, including signs/sx bleeding and clotting, as well as food and drug interactions with warfarin. 50% of time was spent on fingerprick POC INR sample collection,processing, results determination, and documentation in 08/02/2020.

## 2020-05-06 NOTE — Progress Notes (Signed)
INTERNAL MEDICINE TEACHING ATTENDING ADDENDUM ° °I agree with pharmacy recommendations as outlined in their note.  ° °-Roxan Yamamoto MD ° °

## 2020-05-26 ENCOUNTER — Telehealth: Payer: Self-pay | Admitting: Pharmacist

## 2020-05-26 NOTE — Telephone Encounter (Signed)
Called patient and left VM:  Must reschedule appointment for 11-OCT-21 at 4PM TO--> 25-OCT-21 at 4PM.

## 2020-06-02 ENCOUNTER — Ambulatory Visit: Payer: BC Managed Care – PPO

## 2020-06-11 ENCOUNTER — Other Ambulatory Visit: Payer: Self-pay | Admitting: Internal Medicine

## 2020-06-11 DIAGNOSIS — R768 Other specified abnormal immunological findings in serum: Secondary | ICD-10-CM

## 2020-06-11 MED ORDER — VALACYCLOVIR HCL 500 MG PO TABS: 500.0000 mg | ORAL_TABLET | Freq: Two times a day (BID) | ORAL | 3 refills | Status: DC

## 2020-06-11 NOTE — Progress Notes (Signed)
Patient called requesting refill on valtrex. He feels an outbreak of HSV-2 coming on. In the past he has had burning in his genital area with bumps. Describes a similar sensation. Refill order placed to [facility] in [place] on [address]

## 2020-06-16 ENCOUNTER — Ambulatory Visit (INDEPENDENT_AMBULATORY_CARE_PROVIDER_SITE_OTHER): Payer: BC Managed Care – PPO | Admitting: Pharmacist

## 2020-06-16 DIAGNOSIS — Z7901 Long term (current) use of anticoagulants: Secondary | ICD-10-CM

## 2020-06-16 DIAGNOSIS — I2699 Other pulmonary embolism without acute cor pulmonale: Secondary | ICD-10-CM

## 2020-06-16 LAB — POCT INR: INR: 2.9 (ref 2.0–3.0)

## 2020-06-16 MED ORDER — WARFARIN SODIUM 5 MG PO TABS: ORAL_TABLET | ORAL | 3 refills | Status: DC

## 2020-06-16 NOTE — Patient Instructions (Signed)
Patient instructed to take medications as defined in the Anti-coagulation Track section of this encounter.  Patient instructed to take today's dose.  Patient instructed to take 1 and 1/2  Tablets of your 5mg  peach-colored warfarin tablets by mouth daily at [facility] on Sundays, Tuesdays,Thursdays, and Saturdays. On all other days, take two (2) tablets. Patient verbalized understanding of these instructions.

## 2020-06-16 NOTE — Progress Notes (Signed)
Anticoagulation Management Richard [name] is a 44 y.o. male who reports to the clinic for monitoring of warfarin treatment.    Indication: PE, History of PE with infarction; Long term current use of anticoagulant.  Duration: indefinite Supervising physician: Debe Coder  Anticoagulation Clinic Visit History: Patient does not report signs/symptoms of bleeding or thromboembolism  Other recent changes: No diet, medications, lifestyle changes endorsed by the patient at this visit.  Anticoagulation Episode Summary    Current INR goal:  2.0-3.0  TTR:  54.3 % (8.1 y)  Next INR check:  07/14/2020  INR from last check:  2.9 (06/16/2020)  Weekly max warfarin dose:    Target end date:  Indefinite  INR check location:  Anticoagulation Clinic  Preferred lab:    Send INR reminders to:     Indications   Pulmonary embolism and infarction (HCC) [I26.99] D V T (Resolved) [I80.299] Long term (current) use of anticoagulants [Z79.01]       Comments:          No Known Allergies  Current Outpatient Medications:  .  cetirizine (ZYRTEC) 10 MG tablet, Take 1 tablet (10 mg total) by mouth daily., Disp: 100 tablet, Rfl: 0 .  valACYclovir (VALTREX) 500 MG tablet, Take 1 tablet (500 mg total) by mouth 2 (two) times daily. For three days., Disp: 6 tablet, Rfl: 3 .  warfarin (COUMADIN) 5 MG tablet, Take one-and-one-half tablets of your 5mg  peach-colored warfarin tablets on Sundays, Tuesdays, Thursdays and Saturdays. All OTHER days,  take two (2) tablets., Disp: 48 tablet, Rfl: 3 Past Medical History:  Diagnosis Date  . D V T 10/09/2010   Qualifier: Diagnosis of  By: 10/11/2010 MD, [name]     . DVT (deep venous thrombosis) (HCC)   . Seasonal allergies    Social History   Socioeconomic History  . Marital status: Single    Spouse name: Not on file  . Number of children: Not on file  . Years of education: Not on file  . Highest education level: Not on file  Occupational History  . Not on file  Tobacco  Use  . Smoking status: Former Smoker    Years: 1.00    Types: Cigarettes, Cigars    Quit date: 04/05/2012    Years since quitting: 8.2  . Smokeless tobacco: Never Used  Substance and Sexual Activity  . Alcohol use: No    Alcohol/week: 0.0 standard drinks  . Drug use: No  . Sexual activity: Not on file  Other Topics Concern  . Not on file  Social History Narrative  . Not on file   Social Determinants of Health   Financial Resource Strain:   . Difficulty of Paying Living Expenses: Not on file  Food Insecurity:   . Worried About 04/07/2012 in the Last Year: Not on file  . Ran Out of Food in the Last Year: Not on file  Transportation Needs:   . Lack of Transportation (Medical): Not on file  . Lack of Transportation (Non-Medical): Not on file  Physical Activity:   . Days of Exercise per Week: Not on file  . Minutes of Exercise per Session: Not on file  Stress:   . Feeling of Stress : Not on file  Social Connections:   . Frequency of Communication with Friends and Family: Not on file  . Frequency of Social Gatherings with Friends and Family: Not on file  . Attends Religious Services: Not on file  . Active Member of  Clubs or Organizations: Not on file  . Attends Banker Meetings: Not on file  . Marital Status: Not on file   Family History  Problem Relation Age of Onset  . Other Mother   . Other Father   . Pulmonary embolism Maternal Uncle     ASSESSMENT Recent Results: The most recent result is correlated with 62.5 mg per week: Lab Results  Component Value Date   INR 2.9 06/16/2020   INR 1.8 (A) 05/05/2020   INR 2.1 04/07/2020   PROTIME 20.4 (H) 03/16/2011    Anticoagulation Dosing: Description   Take 1 and 1/2  Tablets of your 5mg  peach-colored warfarin tablets by mouth daily at [facility] on Sundays, Tuesdays,Thursdays, and Saturdays. On all other days, take two (2) tablets.       INR today: Therapeutic  PLAN Weekly dose was decreased by 4%  to 60 mg per week  Patient Instructions  Patient instructed to take medications as defined in the Anti-coagulation Track section of this encounter.  Patient instructed to take today's dose.  Patient instructed to take 1 and 1/2  Tablets of your 5mg  peach-colored warfarin tablets by mouth daily at [facility] on Sundays, Tuesdays,Thursdays, and Saturdays. On all other days, take two (2) tablets. Patient verbalized understanding of these instructions.    Patient advised to contact clinic or seek medical attention if signs/symptoms of bleeding or thromboembolism occur.  Patient verbalized understanding by repeating back information and was advised to contact me if further medication-related questions arise. Patient was also provided an information handout.  Follow-up Return in 4 weeks (on 07/14/2020) for Follow up INR.  11-18-1989, PharmD, CPP  15 minutes spent face-to-face with the patient during the encounter. 50% of time spent on education, including signs/sx bleeding and clotting, as well as food and drug interactions with warfarin. 50% of time was spent on fingerprick POC INR sample collection,processing, results determination, and documentation in 07/16/2020.

## 2020-06-23 NOTE — Progress Notes (Signed)
I have reviewed Dr. Saralyn Pilar note.  Patient is on coumadin for recurrent VTE.  INR at the high range, dose mildly decreased.

## 2020-07-14 ENCOUNTER — Ambulatory Visit (INDEPENDENT_AMBULATORY_CARE_PROVIDER_SITE_OTHER): Payer: BC Managed Care – PPO | Admitting: Pharmacist

## 2020-07-14 DIAGNOSIS — Z7901 Long term (current) use of anticoagulants: Secondary | ICD-10-CM | POA: Diagnosis not present

## 2020-07-14 DIAGNOSIS — I2699 Other pulmonary embolism without acute cor pulmonale: Secondary | ICD-10-CM | POA: Diagnosis not present

## 2020-07-14 LAB — POCT INR: INR: 3.6 — AB (ref 2.0–3.0)

## 2020-07-14 NOTE — Patient Instructions (Signed)
Patient instructed to take medications as defined in the Anti-coagulation Track section of this encounter.  °Patient instructed to take today's dose.  °Patient instructed to take  1 and 1/2  Tablets of your 5mg peach-colored warfarin tablets by mouth daily at 6PM on Sundays, Mondays, Tuesdays,Thursdays, and Saturdays. On all other days, take two (2) tablets.   °Patient verbalized understanding of these instructions.  ° °

## 2020-07-14 NOTE — Progress Notes (Signed)
Anticoagulation Management Richard [name] is a 44 y.o. male who reports to the clinic for monitoring of warfarin treatment.    Indication: Pulmonary embolism with infarction, History of; Long term current use of warfarin with target INR 2.0 - 3.0   Duration: indefinite Supervising physician: [name]  Anticoagulation Clinic Visit History: Patient does not report signs/symptoms of bleeding or thromboembolism  Other recent changes: No diet, medications, lifestyle changes.  Anticoagulation Episode Summary    Current INR goal:  2.0-3.0  TTR:  54.0 % (8.2 y)  Next INR check:  08/11/2020  INR from last check:  3.6 (07/14/2020)  Weekly max warfarin dose:    Target end date:  Indefinite  INR check location:  Anticoagulation Clinic  Preferred lab:    Send INR reminders to:     Indications   Pulmonary embolism and infarction (HCC) [I26.99] D V T (Resolved) [I80.299] Long term (current) use of anticoagulants [Z79.01]       Comments:          No Known Allergies  Current Outpatient Medications:  .  cetirizine (ZYRTEC) 10 MG tablet, Take 1 tablet (10 mg total) by mouth daily., Disp: 100 tablet, Rfl: 0 .  valACYclovir (VALTREX) 500 MG tablet, Take 1 tablet (500 mg total) by mouth 2 (two) times daily. For three days., Disp: 6 tablet, Rfl: 3 .  warfarin (COUMADIN) 5 MG tablet, Take one-and-one-half tablets of your 5mg  peach-colored warfarin tablets on Sundays, Tuesdays, Thursdays and Saturdays. All OTHER days,  take two (2) tablets., Disp: 48 tablet, Rfl: 3 Past Medical History:  Diagnosis Date  . D V T 10/09/2010   Qualifier: Diagnosis of  By: 10/11/2010 MD, [name]     . DVT (deep venous thrombosis) (HCC)   . Seasonal allergies    Social History   Socioeconomic History  . Marital status: Single    Spouse name: Not on file  . Number of children: Not on file  . Years of education: Not on file  . Highest education level: Not on file  Occupational History  . Not on file  Tobacco  Use  . Smoking status: Former Smoker    Years: 1.00    Types: Cigarettes, Cigars    Quit date: 04/05/2012    Years since quitting: 8.2  . Smokeless tobacco: Never Used  Substance and Sexual Activity  . Alcohol use: No    Alcohol/week: 0.0 standard drinks  . Drug use: No  . Sexual activity: Not on file  Other Topics Concern  . Not on file  Social History Narrative  . Not on file   Social Determinants of Health   Financial Resource Strain:   . Difficulty of Paying Living Expenses: Not on file  Food Insecurity:   . Worried About 04/07/2012 in the Last Year: Not on file  . Ran Out of Food in the Last Year: Not on file  Transportation Needs:   . Lack of Transportation (Medical): Not on file  . Lack of Transportation (Non-Medical): Not on file  Physical Activity:   . Days of Exercise per Week: Not on file  . Minutes of Exercise per Session: Not on file  Stress:   . Feeling of Stress : Not on file  Social Connections:   . Frequency of Communication with Friends and Family: Not on file  . Frequency of Social Gatherings with Friends and Family: Not on file  . Attends Religious Services: Not on file  . Active Member of  Clubs or Organizations: Not on file  . Attends Banker Meetings: Not on file  . Marital Status: Not on file   Family History  Problem Relation Age of Onset  . Other Mother   . Other Father   . Pulmonary embolism Maternal Uncle     ASSESSMENT Recent Results: The most recent result is correlated with 60 mg per week: Lab Results  Component Value Date   INR 3.6 (A) 07/14/2020   INR 2.9 06/16/2020   INR 1.8 (A) 05/05/2020   PROTIME 20.4 (H) 03/16/2011    Anticoagulation Dosing: Description   Take 1 and 1/2  Tablets of your 5mg  peach-colored warfarin tablets by mouth daily at [facility] on Sundays, Mondays, Tuesdays,Thursdays, and Saturdays. On all other days, take two (2) tablets.       INR today: Supratherapeutic  PLAN Weekly dose was  decreased by 4% to 57.5 mg per week  Patient Instructions  Patient instructed to take medications as defined in the Anti-coagulation Track section of this encounter.  Patient instructed to take today's dose.  Patient instructed to take 1 and 1/2  Tablets of your 5mg  peach-colored warfarin tablets by mouth daily at [facility] on Sundays, Mondays, Tuesdays,Thursdays, and Saturdays. On all other days, take two (2) tablets. Patient verbalized understanding of these instructions.    Patient advised to contact clinic or seek medical attention if signs/symptoms of bleeding or thromboembolism occur.  Patient verbalized understanding by repeating back information and was advised to contact me if further medication-related questions arise. Patient was also provided an information handout.  Follow-up Return in 4 weeks (on 08/11/2020) for Follow up INR.  11-18-1989, PharmD, CPP  15 minutes spent face-to-face with the patient during the encounter. 50% of time spent on education, including signs/sx bleeding and clotting, as well as food and drug interactions with warfarin. 50% of time was spent on fingerprick POC INR sample collection,processing, results determination, and documentation in 08/13/2020.

## 2020-07-15 NOTE — Progress Notes (Signed)
INTERNAL MEDICINE TEACHING ATTENDING ADDENDUM ° °I agree with pharmacy recommendations as outlined in their note.  ° °-Keniya Schlotterbeck MD ° °

## 2020-07-22 ENCOUNTER — Other Ambulatory Visit: Payer: Self-pay | Admitting: Internal Medicine

## 2020-07-22 DIAGNOSIS — I2699 Other pulmonary embolism without acute cor pulmonale: Secondary | ICD-10-CM

## 2020-07-22 DIAGNOSIS — Z7901 Long term (current) use of anticoagulants: Secondary | ICD-10-CM

## 2020-07-22 MED ORDER — WARFARIN SODIUM 5 MG PO TABS: ORAL_TABLET | ORAL | 0 refills | Status: DC

## 2020-08-11 ENCOUNTER — Ambulatory Visit (INDEPENDENT_AMBULATORY_CARE_PROVIDER_SITE_OTHER): Payer: BC Managed Care – PPO | Admitting: Pharmacist

## 2020-08-11 DIAGNOSIS — Z7901 Long term (current) use of anticoagulants: Secondary | ICD-10-CM | POA: Diagnosis not present

## 2020-08-11 DIAGNOSIS — I2699 Other pulmonary embolism without acute cor pulmonale: Secondary | ICD-10-CM | POA: Diagnosis not present

## 2020-08-11 LAB — POCT INR: INR: 2.8 (ref 2.0–3.0)

## 2020-08-11 MED ORDER — WARFARIN SODIUM 5 MG PO TABS: ORAL_TABLET | ORAL | 2 refills | Status: DC

## 2020-08-11 NOTE — Patient Instructions (Signed)
Patient instructed to take medications as defined in the Anti-coagulation Track section of this encounter.  Patient instructed to take today's dose.  Patient instructed to take  1 and 1/2  Tablets of your 5mg  peach-colored warfarin tablets by mouth daily at [facility] on Sundays, Mondays, Tuesdays,Thursdays, and Saturdays. On all other days, take two (2) tablets.   Patient verbalized understanding of these instructions.

## 2020-08-11 NOTE — Progress Notes (Signed)
Anticoagulation Management Richard [name] is a 44 y.o. male who reports to the clinic for monitoring of warfarin treatment.    Indication: PE with infarction (History of; resolved); Long term current use of oral anticoagulant, warfarin to maintain INR 2.0 - 3.0.    Duration: indefinite Supervising physician: Carlynn Purl  Anticoagulation Clinic Visit History: Patient does not report signs/symptoms of bleeding or thromboembolism  Other recent changes: No diet, medications, lifestyle changes.  Anticoagulation Episode Summary    Current INR goal:  2.0-3.0  TTR:  53.7 % (8.3 y)  Next INR check:  09/15/2020  INR from last check:  2.8 (08/11/2020)  Weekly max warfarin dose:    Target end date:  Indefinite  INR check location:  Anticoagulation Clinic  Preferred lab:    Send INR reminders to:     Indications   Pulmonary embolism and infarction (HCC) [I26.99] D V T (Resolved) [I80.299] Long term (current) use of anticoagulants [Z79.01]       Comments:          No Known Allergies  Current Outpatient Medications:  .  cetirizine (ZYRTEC) 10 MG tablet, Take 1 tablet (10 mg total) by mouth daily., Disp: 100 tablet, Rfl: 0 .  valACYclovir (VALTREX) 500 MG tablet, Take 1 tablet (500 mg total) by mouth 2 (two) times daily. For three days., Disp: 6 tablet, Rfl: 3 .  warfarin (COUMADIN) 5 MG tablet, Take one-and-one-half tablets of your 5mg  peach-colored warfarin tablets on Sundays, Mondays, Tuesdays, Thursdays and Saturdays. All OTHER days,  take two (2) tablets., Disp: 48 tablet, Rfl: 2 Past Medical History:  Diagnosis Date  . D V T 10/09/2010   Qualifier: Diagnosis of  By: 10/11/2010 MD, [name]     . DVT (deep venous thrombosis) (HCC)   . Seasonal allergies    Social History   Socioeconomic History  . Marital status: Single    Spouse name: Not on file  . Number of children: Not on file  . Years of education: Not on file  . Highest education level: Not on file  Occupational History  .  Not on file  Tobacco Use  . Smoking status: Former Smoker    Years: 1.00    Types: Cigarettes, Cigars    Quit date: 04/05/2012    Years since quitting: 8.3  . Smokeless tobacco: Never Used  Substance and Sexual Activity  . Alcohol use: No    Alcohol/week: 0.0 standard drinks  . Drug use: No  . Sexual activity: Not on file  Other Topics Concern  . Not on file  Social History Narrative  . Not on file   Social Determinants of Health   Financial Resource Strain: Not on file  Food Insecurity: Not on file  Transportation Needs: Not on file  Physical Activity: Not on file  Stress: Not on file  Social Connections: Not on file   Family History  Problem Relation Age of Onset  . Other Mother   . Other Father   . Pulmonary embolism Maternal Uncle     ASSESSMENT Recent Results: The most recent result is correlated with 57.5 mg per week: Lab Results  Component Value Date   INR 2.8 08/11/2020   INR 3.6 (A) 07/14/2020   INR 2.9 06/16/2020   PROTIME 20.4 (H) 03/16/2011    Anticoagulation Dosing: Description   Take 1 and 1/2  Tablets of your 5mg  peach-colored warfarin tablets by mouth daily at [facility] on Sundays, Mondays, Tuesdays,Thursdays, and Saturdays. On all other days,  take two (2) tablets.       INR today: Therapeutic  PLAN Weekly dose was unchanged.   Patient Instructions  Patient instructed to take medications as defined in the Anti-coagulation Track section of this encounter.  Patient instructed to take today's dose.  Patient instructed to take  1 and 1/2  Tablets of your 5mg  peach-colored warfarin tablets by mouth daily at [facility] on Sundays, Mondays, Tuesdays,Thursdays, and Saturdays. On all other days, take two (2) tablets.   Patient verbalized understanding of these instructions.    Patient advised to contact clinic or seek medical attention if signs/symptoms of bleeding or thromboembolism occur.  Patient verbalized understanding by repeating back information and  was advised to contact me if further medication-related questions arise. Patient was also provided an information handout.  Follow-up Return in 5 weeks (on 09/15/2020) for Follow up INR.  09/17/2020, PharmD, CPP  15 minutes spent face-to-face with the patient during the encounter. 50% of time spent on education, including signs/sx bleeding and clotting, as well as food and drug interactions with warfarin. 50% of time was spent on fingerprick POC INR sample collection,processing, results determination, and documentation in [name].

## 2020-09-15 ENCOUNTER — Ambulatory Visit (INDEPENDENT_AMBULATORY_CARE_PROVIDER_SITE_OTHER): Payer: BC Managed Care – PPO | Admitting: Pharmacist

## 2020-09-15 DIAGNOSIS — I2699 Other pulmonary embolism without acute cor pulmonale: Secondary | ICD-10-CM | POA: Diagnosis not present

## 2020-09-15 DIAGNOSIS — Z7901 Long term (current) use of anticoagulants: Secondary | ICD-10-CM | POA: Diagnosis not present

## 2020-09-15 LAB — POCT INR: INR: 3.3 — AB (ref 2.0–3.0)

## 2020-09-16 NOTE — Progress Notes (Signed)
Anticoagulation Management Richard [name] is a 45 y.o. male who reports to the clinic for monitoring of warfarin treatment.    Indication: PE, with infarction (History of; Resolved); Long term current use of anticoagulant warfarin with INR goal 2.0 - 3.0.    Duration: indefinite Supervising physician: Debe Coder  Anticoagulation Clinic Visit History: Patient does not report signs/symptoms of bleeding or thromboembolism  Other recent changes: No diet, medications, lifestyle Anticoagulation Episode Summary    Current INR goal:  2.0-3.0  TTR:  53.5 % (8.4 y)  Next INR check:  10/13/2020  INR from last check:  3.3 (09/15/2020)  Weekly max warfarin dose:    Target end date:  Indefinite  INR check location:  Anticoagulation Clinic  Preferred lab:    Send INR reminders to:     Indications   Pulmonary embolism and infarction (HCC) [I26.99] D V T (Resolved) [I80.299] Long term (current) use of anticoagulants [Z79.01]       Comments:          No Known Allergies  Current Outpatient Medications:  .  cetirizine (ZYRTEC) 10 MG tablet, Take 1 tablet (10 mg total) by mouth daily., Disp: 100 tablet, Rfl: 0 .  valACYclovir (VALTREX) 500 MG tablet, Take 1 tablet (500 mg total) by mouth 2 (two) times daily. For three days., Disp: 6 tablet, Rfl: 3 .  warfarin (COUMADIN) 5 MG tablet, Take one-and-one-half tablets of your 5mg  peach-colored warfarin tablets on Sundays, Mondays, Tuesdays, Thursdays and Saturdays. All OTHER days,  take two (2) tablets., Disp: 48 tablet, Rfl: 2 Past Medical History:  Diagnosis Date  . D V T 10/09/2010   Qualifier: Diagnosis of  By: 10/11/2010 MD, [name]     . DVT (deep venous thrombosis) (HCC)   . Seasonal allergies    Social History   Socioeconomic History  . Marital status: Single    Spouse name: Not on file  . Number of children: Not on file  . Years of education: Not on file  . Highest education level: Not on file  Occupational History  . Not on file   Tobacco Use  . Smoking status: Former Smoker    Years: 1.00    Types: Cigarettes, Cigars    Quit date: 04/05/2012    Years since quitting: 8.4  . Smokeless tobacco: Never Used  Substance and Sexual Activity  . Alcohol use: No    Alcohol/week: 0.0 standard drinks  . Drug use: No  . Sexual activity: Not on file  Other Topics Concern  . Not on file  Social History Narrative  . Not on file   Social Determinants of Health   Financial Resource Strain: Not on file  Food Insecurity: Not on file  Transportation Needs: Not on file  Physical Activity: Not on file  Stress: Not on file  Social Connections: Not on file   Family History  Problem Relation Age of Onset  . Other Mother   . Other Father   . Pulmonary embolism Maternal Uncle     ASSESSMENT Recent Results: The most recent result is correlated with 57.5 mg per week: Lab Results  Component Value Date   INR 3.3 (A) 09/15/2020   INR 2.8 08/11/2020   INR 3.6 (A) 07/14/2020   PROTIME 20.4 (H) 03/16/2011    Anticoagulation Dosing: Description   Take 1 and 1/2  Tablets of your 5mg  peach-colored warfarin tablets by mouth daily at 6PM everyday--EXCEPT on Wednesdays, take two (2) tablets each Wednesday.  INR today: Supratherapeutic  PLAN Weekly dose was decreased by 4% to 55 mg per week  Patient Instructions  Patient instructed to take medications as defined in the Anti-coagulation Track section of this encounter.  Patient instructed to take today's dose.  Patient instructed to take 1 and 1/2  Tablets of your 5mg  peach-colored warfarin tablets by mouth daily at 6PM everyday--EXCEPT on Wednesdays, take two (2) tablets each Wednesday. Patient verbalized understanding of these instructions.    Patient advised to contact clinic or seek medical attention if signs/symptoms of bleeding or thromboembolism occur.  Patient verbalized understanding by repeating back information and was advised to contact me if further  medication-related questions arise. Patient was also provided an information handout.  Follow-up Return in 4 weeks (on 10/13/2020) for Follow up INR.  10/15/2020, PharmD, CPP  15 minutes spent face-to-face with the patient during the encounter. 50% of time spent on education, including signs/sx bleeding and clotting, as well as food and drug interactions with warfarin. 50% of time was spent on fingerprick POC INR sample collection,processing, results determination, and documentation in [name].

## 2020-09-16 NOTE — Patient Instructions (Signed)
Patient instructed to take medications as defined in the Anti-coagulation Track section of this encounter.  Patient instructed to take today's dose.  Patient instructed to take 1 and 1/2  Tablets of your 5mg  peach-colored warfarin tablets by mouth daily at 6PM everyday--EXCEPT on Wednesdays, take two (2) tablets each Wednesday. Patient verbalized understanding of these instructions.

## 2020-09-24 NOTE — Progress Notes (Signed)
I reviewed Dr. Saralyn Pilar note.  Patient is on coumadin for PE.  INR was elevated and dose decreased.

## 2020-10-13 ENCOUNTER — Ambulatory Visit (INDEPENDENT_AMBULATORY_CARE_PROVIDER_SITE_OTHER): Payer: BC Managed Care – PPO | Admitting: Pharmacist

## 2020-10-13 DIAGNOSIS — I2699 Other pulmonary embolism without acute cor pulmonale: Secondary | ICD-10-CM | POA: Diagnosis not present

## 2020-10-13 DIAGNOSIS — Z7901 Long term (current) use of anticoagulants: Secondary | ICD-10-CM | POA: Diagnosis not present

## 2020-10-13 LAB — POCT INR: INR: 4 — AB (ref 2.0–3.0)

## 2020-10-14 NOTE — Patient Instructions (Signed)
Patient instructed to take medications as defined in the Anti-coagulation Track section of this encounter.  Patient instructed to OMIT on Tuesday, October 14, 2020.  Patient instructed to take 1 and 1/2  Tablets of your 5mg  peach-colored warfarin tablets by mouth daily at 6PM everyday--EXCEPT on Tuesdays and Fridays--take only one (1) tablet on these days. OMIT your dose on Tuesday, October 14, 2020. Patient verbalized understanding of these instructions.

## 2020-10-14 NOTE — Progress Notes (Signed)
Anticoagulation Management Richard [name] is a 45 y.o. male who reports to the clinic for monitoring of warfarin treatment.    Indication: PE , history of with infarction; long term current use of oral anticoagulation with warfarin to maintain INR 2.0 - 3.0.  Duration: indefinite Supervising physician: Earl Lagos  Anticoagulation Clinic Visit History: Patient does not report signs/symptoms of bleeding or thromboembolism  Other recent changes: No diet, medications, lifestyle endorsed.  Anticoagulation Episode Summary    Current INR goal:  2.0-3.0  TTR:  53.1 % (8.5 y)  Next INR check:  11/03/2020  INR from last check:  4.0 (10/13/2020)  Weekly max warfarin dose:    Target end date:  Indefinite  INR check location:  Anticoagulation Clinic  Preferred lab:    Send INR reminders to:     Indications   Pulmonary embolism and infarction (HCC) [I26.99] D V T (Resolved) [I80.299] Long term (current) use of anticoagulants [Z79.01]       Comments:          No Known Allergies  Current Outpatient Medications:  .  cetirizine (ZYRTEC) 10 MG tablet, Take 1 tablet (10 mg total) by mouth daily., Disp: 100 tablet, Rfl: 0 .  valACYclovir (VALTREX) 500 MG tablet, Take 1 tablet (500 mg total) by mouth 2 (two) times daily. For three days., Disp: 6 tablet, Rfl: 3 .  warfarin (COUMADIN) 5 MG tablet, Take one-and-one-half tablets of your 5mg  peach-colored warfarin tablets on Sundays, Mondays, Tuesdays, Thursdays and Saturdays. All OTHER days,  take two (2) tablets., Disp: 48 tablet, Rfl: 2 Past Medical History:  Diagnosis Date  . D V T 10/09/2010   Qualifier: Diagnosis of  By: 10/11/2010 MD, [name]     . DVT (deep venous thrombosis) (HCC)   . Seasonal allergies    Social History   Socioeconomic History  . Marital status: Single    Spouse name: Not on file  . Number of children: Not on file  . Years of education: Not on file  . Highest education level: Not on file  Occupational History  .  Not on file  Tobacco Use  . Smoking status: Former Smoker    Years: 1.00    Types: Cigarettes, Cigars    Quit date: 04/05/2012    Years since quitting: 8.5  . Smokeless tobacco: Never Used  Substance and Sexual Activity  . Alcohol use: No    Alcohol/week: 0.0 standard drinks  . Drug use: No  . Sexual activity: Not on file  Other Topics Concern  . Not on file  Social History Narrative  . Not on file   Social Determinants of Health   Financial Resource Strain: Not on file  Food Insecurity: Not on file  Transportation Needs: Not on file  Physical Activity: Not on file  Stress: Not on file  Social Connections: Not on file   Family History  Problem Relation Age of Onset  . Other Mother   . Other Father   . Pulmonary embolism Maternal Uncle     ASSESSMENT Recent Results: The most recent result is correlated with 55 mg per week: Lab Results  Component Value Date   INR 4.0 (A) 10/13/2020   INR 3.3 (A) 09/15/2020   INR 2.8 08/11/2020   PROTIME 20.4 (H) 03/16/2011    Anticoagulation Dosing: Description   Take 1 and 1/2  Tablets of your 5mg  peach-colored warfarin tablets by mouth daily at 6PM everyday--EXCEPT on Tuesdays and Fridays--take only one (1) tablet on  these days. OMIT your dose on Tuesday, October 14, 2020.     INR today: Supratherapeutic  PLAN Weekly dose was decreased by 13% to 47.5 mg per week (and one omitted dose in this first week.)  Patient Instructions  Patient instructed to take medications as defined in the Anti-coagulation Track section of this encounter.  Patient instructed to OMIT on Tuesday, October 14, 2020.  Patient instructed to take 1 and 1/2  Tablets of your 5mg  peach-colored warfarin tablets by mouth daily at 6PM everyday--EXCEPT on Tuesdays and Fridays--take only one (1) tablet on these days. OMIT your dose on Tuesday, October 14, 2020. Patient verbalized understanding of these instructions.    Patient advised to contact clinic or  seek medical attention if signs/symptoms of bleeding or thromboembolism occur.  Patient verbalized understanding by repeating back information and was advised to contact me if further medication-related questions arise. Patient was also provided an information handout.  Follow-up Return in 3 weeks (on 11/03/2020) for Follow up INR.  11/05/2020, PharmD, CPP  15 minutes spent face-to-face with the patient during the encounter. 50% of time spent on education, including signs/sx bleeding and clotting, as well as food and drug interactions with warfarin. 50% of time was spent on fingerprick POC INR sample collection,processing, results determination, and documentation in [name].

## 2020-10-20 NOTE — Progress Notes (Signed)
INTERNAL MEDICINE TEACHING ATTENDING ADDENDUM - Cynthea Zachman M.D  Duration- indefinite, Indication- recurrent PE, INR- supratherapeutic. Agree with pharmacy recommendations as outlined in their note.      

## 2020-11-03 ENCOUNTER — Ambulatory Visit (INDEPENDENT_AMBULATORY_CARE_PROVIDER_SITE_OTHER): Payer: BC Managed Care – PPO | Admitting: Pharmacist

## 2020-11-03 DIAGNOSIS — Z7901 Long term (current) use of anticoagulants: Secondary | ICD-10-CM | POA: Diagnosis not present

## 2020-11-03 DIAGNOSIS — I2699 Other pulmonary embolism without acute cor pulmonale: Secondary | ICD-10-CM | POA: Diagnosis not present

## 2020-11-03 LAB — POCT INR: INR: 3.1 — AB (ref 2.0–3.0)

## 2020-11-03 MED ORDER — WARFARIN SODIUM 5 MG PO TABS: ORAL_TABLET | ORAL | 2 refills | Status: DC

## 2020-11-03 NOTE — Progress Notes (Signed)
Anticoagulation Management Richard [name] is a 45 y.o. male who reports to the clinic for monitoring of warfarin treatment.    Indication: PE, history of, with infarction; long term current use of oral anticoagulant warfarin with target INR 2.0 - 3.0.  Duration: indefinite Supervising physician: Charissa Bash, MD  Anticoagulation Clinic Visit History: Patient does not report signs/symptoms of bleeding or thromboembolism  Other recent changes: No diet, medications, lifestyle changes endorsed.  Anticoagulation Episode Summary    Current INR goal:  2.0-3.0  TTR:  52.7 % (8.5 y)  Next INR check:  12/01/2020  INR from last check:  3.1 (11/03/2020)  Weekly max warfarin dose:    Target end date:  Indefinite  INR check location:  Anticoagulation Clinic  Preferred lab:    Send INR reminders to:     Indications   Pulmonary embolism and infarction (HCC) [I26.99] D V T (Resolved) [I80.299] Long term (current) use of anticoagulants [Z79.01]       Comments:          No Known Allergies  Current Outpatient Medications:  .  cetirizine (ZYRTEC) 10 MG tablet, Take 1 tablet (10 mg total) by mouth daily., Disp: 100 tablet, Rfl: 0 .  valACYclovir (VALTREX) 500 MG tablet, Take 1 tablet (500 mg total) by mouth 2 (two) times daily. For three days., Disp: 6 tablet, Rfl: 3 .  warfarin (COUMADIN) 5 MG tablet, Take one-and-one-half tablets of your 5mg  peach-colored warfarin tablets on Sundays, Tuesdays, Thursdays and Saturdays. All OTHER days (Mondays, Wednesdays and Fridays), take one-and-one-half (1&1/2) tablets., Disp: 36 tablet, Rfl: 2 Past Medical History:  Diagnosis Date  . D V T 10/09/2010   Qualifier: Diagnosis of  By: 10/11/2010 MD, [name]     . DVT (deep venous thrombosis) (HCC)   . Seasonal allergies    Social History   Socioeconomic History  . Marital status: Single    Spouse name: Not on file  . Number of children: Not on file  . Years of education: Not on file  . Highest education  level: Not on file  Occupational History  . Not on file  Tobacco Use  . Smoking status: Former Smoker    Years: 1.00    Types: Cigarettes, Cigars    Quit date: 04/05/2012    Years since quitting: 8.5  . Smokeless tobacco: Never Used  Substance and Sexual Activity  . Alcohol use: No    Alcohol/week: 0.0 standard drinks  . Drug use: No  . Sexual activity: Not on file  Other Topics Concern  . Not on file  Social History Narrative  . Not on file   Social Determinants of Health   Financial Resource Strain: Not on file  Food Insecurity: Not on file  Transportation Needs: Not on file  Physical Activity: Not on file  Stress: Not on file  Social Connections: Not on file   Family History  Problem Relation Age of Onset  . Other Mother   . Other Father   . Pulmonary embolism Maternal Uncle     ASSESSMENT Recent Results: The most recent result is correlated with 47.5 mg per week: Lab Results  Component Value Date   INR 3.1 (A) 11/03/2020   INR 4.0 (A) 10/13/2020   INR 3.3 (A) 09/15/2020   PROTIME 20.4 (H) 03/16/2011    Anticoagulation Dosing: Description   Take 1 and 1/2  Tablets of your 5mg  peach-colored warfarin tablets by mouth daily at 6PM everyday--EXCEPT on Mondays, Wednesdays and Fridays--take only  one (1) tablet on these days.      INR today: Supratherapeutic  PLAN Weekly dose was decreased by 5.3% to 45 mg per week  Patient Instructions  Patient instructed to take medications as defined in the Anti-coagulation Track section of this encounter.  Patient instructed to take today's dose.  Patient instructed to take 1 and 1/2  Tablets of your 5mg  peach-colored warfarin tablets by mouth daily at 6PM everyday--EXCEPT on Mondays, Wednesdays and Fridays--take only one (1) tablet on these days. Patient verbalized understanding of these instructions.    Patient advised to contact clinic or seek medical attention if signs/symptoms of bleeding or thromboembolism  occur.  Patient verbalized understanding by repeating back information and was advised to contact me if further medication-related questions arise. Patient was also provided an information handout.  Follow-up Return in 4 weeks (on 12/01/2020) for Follow up INR.  01/31/2021, PharmD, CPP  15 minutes spent face-to-face with the patient during the encounter. 50% of time spent on education, including signs/sx bleeding and clotting, as well as food and drug interactions with warfarin. 50% of time was spent on fingerprick POC INR sample collection,processing, results determination, and documentation in [name].

## 2020-11-03 NOTE — Patient Instructions (Signed)
Patient instructed to take medications as defined in the Anti-coagulation Track section of this encounter.  Patient instructed to take today's dose.  Patient instructed to take 1 and 1/2  Tablets of your 5mg  peach-colored warfarin tablets by mouth daily at 6PM everyday--EXCEPT on Mondays, Wednesdays and Fridays--take only one (1) tablet on these days. Patient verbalized understanding of these instructions.

## 2020-11-04 NOTE — Progress Notes (Signed)
I agree with Dr. Groce's assessment and plan. 

## 2020-11-06 ENCOUNTER — Telehealth: Payer: Self-pay | Admitting: *Deleted

## 2020-11-06 ENCOUNTER — Other Ambulatory Visit: Payer: Self-pay | Admitting: Pharmacist

## 2020-11-06 DIAGNOSIS — I2699 Other pulmonary embolism without acute cor pulmonale: Secondary | ICD-10-CM

## 2020-11-06 DIAGNOSIS — Z7901 Long term (current) use of anticoagulants: Secondary | ICD-10-CM

## 2020-11-06 MED ORDER — WARFARIN SODIUM 5 MG PO TABS: ORAL_TABLET | ORAL | 2 refills | Status: DC

## 2020-11-06 NOTE — Telephone Encounter (Signed)
Regarding warfarin 5mg  tabs: Received fax from pharmacy requesting sig clarification 1.5 tabs everyday???  Per 11/03/2020 anti-coag visit-dose should be:  Warfarin maintenance plan:  5 mg (5 mg x 1) every Mon, Wed, Fri; 7.5 mg (5 mg x 1.5) all other days    Will send to MD to send in new rx to reflect current dose.08-[birth year]1998, Darlene Cassady3/17/202211:26 AM

## 2020-11-12 ENCOUNTER — Other Ambulatory Visit: Payer: Self-pay | Admitting: Internal Medicine

## 2020-11-12 DIAGNOSIS — R768 Other specified abnormal immunological findings in serum: Secondary | ICD-10-CM

## 2020-11-12 NOTE — Telephone Encounter (Signed)
Called pt no answer °

## 2020-12-01 ENCOUNTER — Ambulatory Visit (INDEPENDENT_AMBULATORY_CARE_PROVIDER_SITE_OTHER): Payer: BC Managed Care – PPO | Admitting: Pharmacist

## 2020-12-01 ENCOUNTER — Other Ambulatory Visit: Payer: Self-pay

## 2020-12-01 DIAGNOSIS — Z7901 Long term (current) use of anticoagulants: Secondary | ICD-10-CM | POA: Diagnosis not present

## 2020-12-01 DIAGNOSIS — I2699 Other pulmonary embolism without acute cor pulmonale: Secondary | ICD-10-CM

## 2020-12-01 LAB — POCT INR: INR: 2 (ref 2.0–3.0)

## 2020-12-01 MED ORDER — WARFARIN SODIUM 5 MG PO TABS: ORAL_TABLET | ORAL | 2 refills | Status: DC

## 2020-12-01 NOTE — Progress Notes (Signed)
Anticoagulation Management Richard [name] is a 45 y.o. male who reports to the clinic for monitoring of warfarin treatment.    Indication: PE with infarction, History of; Long term current use of warfarin oral anticoagulant with INR target 2.0 - 3.0.  Duration: indefinite Supervising physician: Earl Lagos  Anticoagulation Clinic Visit History: Patient Does NOT report signs/symptoms of bleeding or thromboembolism  Other recent changes: No diet, medications, lifestyle changes endorsed by the patient at this visit.  Anticoagulation Episode Summary    Current INR goal:  2.0-3.0  TTR:  53.0 % (8.6 y)  Next INR check:  12/29/2020  INR from last check:  2.0 (12/01/2020)  Weekly max warfarin dose:    Target end date:  Indefinite  INR check location:  Anticoagulation Clinic  Preferred lab:    Send INR reminders to:     Indications   Pulmonary embolism and infarction (HCC) [I26.99] D V T (Resolved) [I80.299] Long term (current) use of anticoagulants [Z79.01]       Comments:          No Known Allergies  Current Outpatient Medications:  .  cetirizine (ZYRTEC) 10 MG tablet, Take 1 tablet (10 mg total) by mouth daily., Disp: 100 tablet, Rfl: 0 .  valACYclovir (VALTREX) 500 MG tablet, TAKE 1 TABLET BY MOUTH TWICE DAILY FOR 3 DAYS, Disp: 6 tablet, Rfl: 0 .  warfarin (COUMADIN) 5 MG tablet, Take one-and-one-half tablets of your 5mg  peach-colored warfarin tablets on Sundays, Tuesdays, Wednesdays,Thursdays and Saturdays. All OTHER days (Mondays, Fridays), take ONLY ONE (1) tablet., Disp: 40 tablet, Rfl: 2 Past Medical History:  Diagnosis Date  . D V T 10/09/2010   Qualifier: Diagnosis of  By: 10/11/2010 MD, [name]     . DVT (deep venous thrombosis) (HCC)   . Seasonal allergies    Social History   Socioeconomic History  . Marital status: Single    Spouse name: Not on file  . Number of children: Not on file  . Years of education: Not on file  . Highest education level: Not on file   Occupational History  . Not on file  Tobacco Use  . Smoking status: Former Smoker    Years: 1.00    Types: Cigarettes, Cigars    Quit date: 04/05/2012    Years since quitting: 8.6  . Smokeless tobacco: Never Used  Substance and Sexual Activity  . Alcohol use: No    Alcohol/week: 0.0 standard drinks  . Drug use: No  . Sexual activity: Not on file  Other Topics Concern  . Not on file  Social History Narrative  . Not on file   Social Determinants of Health   Financial Resource Strain: Not on file  Food Insecurity: Not on file  Transportation Needs: Not on file  Physical Activity: Not on file  Stress: Not on file  Social Connections: Not on file   Family History  Problem Relation Age of Onset  . Other Mother   . Other Father   . Pulmonary embolism Maternal Uncle     ASSESSMENT Recent Results: The most recent result is correlated with 45 mg per week: Lab Results  Component Value Date   INR 2.0 12/01/2020   INR 3.1 (A) 11/03/2020   INR 4.0 (A) 10/13/2020   PROTIME 20.4 (H) 03/16/2011    Anticoagulation Dosing: Description   Take 1 and 1/2  Tablets of your 5mg  peach-colored warfarin tablets by mouth daily at 6PM everyday--EXCEPT on Mondays, and Fridays--take only one (1) tablet  on these days.      INR today: Therapeutic  PLAN Weekly dose was increased by 6% to 47.5 mg per week  Patient Instructions  Patient instructed to take medications as defined in the Anti-coagulation Track section of this encounter.  Patient instructed to take today's dose.  Patient instructed to take  1 and 1/2  Tablets of your 5mg  peach-colored warfarin tablets by mouth daily at 6PM everyday--EXCEPT on Mondays, and Fridays--take only one (1) tablet on these days.  Patient verbalized understanding of these instructions.    Patient advised to contact clinic or seek medical attention if signs/symptoms of bleeding or thromboembolism occur.  Patient requested a refill on his warfarin 5mg   strength peach-colored tablets for which an electronic prescription was sent to [facility], [place] at his request.   Patient verbalized understanding by repeating back information and was advised to contact me if further medication-related questions arise. Patient was also provided an information handout.  Follow-up Return in 4 weeks (on 12/29/2020) for Follow up INR.  [facility] , PharmD, CPP 15 minutes spent face-to-face with the patient during the encounter. 50% of time spent on education, including signs/sx bleeding and clotting, as well as food and drug interactions with warfarin. 50% of time was spent on fingerprick POC INR sample collection,processing, results determination, and documentation in 02/28/2021.

## 2020-12-01 NOTE — Patient Instructions (Signed)
Patient instructed to take medications as defined in the Anti-coagulation Track section of this encounter.  Patient instructed to take today's dose.  Patient instructed to take  1 and 1/2  Tablets of your 5mg  peach-colored warfarin tablets by mouth daily at 6PM everyday--EXCEPT on Mondays, and Fridays--take only one (1) tablet on these days.  Patient verbalized understanding of these instructions.

## 2020-12-03 NOTE — Progress Notes (Signed)
INTERNAL MEDICINE TEACHING ATTENDING ADDENDUM - Nalani Andreen M.D  Duration- indefinite, Indication- recurrent PE, INR- therapeutic. Agree with pharmacy recommendations as outlined in their note.      

## 2020-12-29 ENCOUNTER — Other Ambulatory Visit: Payer: Self-pay | Admitting: Internal Medicine

## 2020-12-29 ENCOUNTER — Ambulatory Visit (INDEPENDENT_AMBULATORY_CARE_PROVIDER_SITE_OTHER): Payer: BC Managed Care – PPO | Admitting: Student in an Organized Health Care Education/Training Program

## 2020-12-29 DIAGNOSIS — Z7901 Long term (current) use of anticoagulants: Secondary | ICD-10-CM | POA: Diagnosis not present

## 2020-12-29 DIAGNOSIS — R768 Other specified abnormal immunological findings in serum: Secondary | ICD-10-CM

## 2020-12-29 DIAGNOSIS — I2699 Other pulmonary embolism without acute cor pulmonale: Secondary | ICD-10-CM | POA: Diagnosis not present

## 2020-12-29 LAB — POCT INR: INR: 3.8 — AB (ref 2.0–3.0)

## 2020-12-29 NOTE — Progress Notes (Signed)
Patient with history of unprovoked PE on indefinite anticoagulation. He is here for routine INR monitoring. His INR is high at 3.8. No bleeding, feeling well. Last month his coumadin dose was increased slightly. He has taken today's dose already.   Plan is to decrease weekly Coumadin to 45mg . He will skip tomorrow morning's dose. Then start 5mg  on MWF, and take 7.5mg  every other day. I explained this to the patient, gave him printed instructions, and will inform Dr. . He will follow up for INR check in one month.

## 2020-12-29 NOTE — Patient Instructions (Signed)
Hold your dose of Coumadin tonight (Monday).   We are going to decrease your dose of Coumadin slightly.   Please take one 5mg  tablet every Monday, Wednesday, and Friday.   Please take one and 1/2 tablets every Tuesday, Thursday, Saturday, and Sunday.

## 2021-01-01 NOTE — Progress Notes (Signed)
Take 1 and 1/2  Tablets of your 5mg  peach-colored warfarin tablets by mouth daily at 6PM everyday--EXCEPT on Mondays, Wednesdays and Fridays--take only one (1) tablet on these days.

## 2021-01-01 NOTE — Progress Notes (Signed)
Anticoagulation Management Richard [name] is a 45 y.o. male who reports to the clinic for monitoring of warfarin treatment.    Indication: PE, history of, with infarction; long term current use of oral anticoagulant warfarin.  Duration: indefinite Supervising physician: [name]  Anticoagulation Clinic Visit History: Patient does not report signs/symptoms of bleeding or thromboembolism  Other recent changes: No diet, medications, lifestyle changes endorsed at visit. Anticoagulation Episode Summary    Current INR goal:  2.0-3.0  TTR:  53.1 % (8.7 y)  Next INR check:  01/26/2021  INR from last check:  3.8 (12/29/2020)  Weekly max warfarin dose:  45 mg  Target end date:  Indefinite  INR check location:  Anticoagulation Clinic  Preferred lab:    Send INR reminders to:     Indications   Pulmonary embolism and infarction (HCC) [I26.99] D V T (Resolved) [I80.299] Long term (current) use of anticoagulants [Z79.01]       Comments:          No Known Allergies  Current Outpatient Medications:  .  cetirizine (ZYRTEC) 10 MG tablet, Take 1 tablet (10 mg total) by mouth daily., Disp: 100 tablet, Rfl: 0 .  warfarin (COUMADIN) 5 MG tablet, Take one-and-one-half tablets of your 5mg  peach-colored warfarin tablets on Sundays, Tuesdays, Wednesdays,Thursdays and Saturdays. All OTHER days (Mondays, Fridays), take ONLY ONE (1) tablet., Disp: 40 tablet, Rfl: 2 .  valACYclovir (VALTREX) 500 MG tablet, TAKE 1 TABLET BY MOUTH TWICE DAILY FOR 3 DAYS (Patient not taking: Reported on 01/01/2021), Disp: 6 tablet, Rfl: 0 Past Medical History:  Diagnosis Date  . D V T 10/09/2010   Qualifier: Diagnosis of  By: 10/11/2010 MD, [name]     . DVT (deep venous thrombosis) (HCC)   . Seasonal allergies    Social History   Socioeconomic History  . Marital status: Single    Spouse name: Not on file  . Number of children: Not on file  . Years of education: Not on file  . Highest education level: Not on file   Occupational History  . Not on file  Tobacco Use  . Smoking status: Former Smoker    Years: 1.00    Types: Cigarettes, Cigars    Quit date: 04/05/2012    Years since quitting: 8.7  . Smokeless tobacco: Never Used  Substance and Sexual Activity  . Alcohol use: No    Alcohol/week: 0.0 standard drinks  . Drug use: No  . Sexual activity: Not on file  Other Topics Concern  . Not on file  Social History Narrative  . Not on file   Social Determinants of Health   Financial Resource Strain: Not on file  Food Insecurity: Not on file  Transportation Needs: Not on file  Physical Activity: Not on file  Stress: Not on file  Social Connections: Not on file   Family History  Problem Relation Age of Onset  . Other Mother   . Other Father   . Pulmonary embolism Maternal Uncle     ASSESSMENT Recent Results: The most recent result is correlated with 47.5 mg per week: Lab Results  Component Value Date   INR 3.8 (A) 12/29/2020   INR 2.0 12/01/2020   INR 3.1 (A) 11/03/2020   PROTIME 20.4 (H) 03/16/2011    Anticoagulation Dosing: Description   Take 1 and 1/2  Tablets of your 5mg  peach-colored warfarin tablets by mouth daily at 6PM everyday--EXCEPT on Mondays, Wednesdays and Fridays--take only one (1) tablet on these days.  INR today: Supratherapeutic  PLAN Weekly dose was decreased by 6% to 45 mg per week--and omitting one days worth of dosing (Tuesday 10-MAY-22 per Dr. [name].)  Patient Instructions  Hold your dose of Coumadin tonight (Monday).   We are going to decrease your dose of Coumadin slightly.   Please take one 5mg  tablet every Monday, Wednesday, and Friday.   Please take one and 1/2 tablets every Tuesday, Thursday, Saturday, and Sunday.  Patient advised to contact clinic or seek medical attention if signs/symptoms of bleeding or thromboembolism occur.  Patient verbalized understanding by repeating back information and was advised to contact me if further  medication-related questions arise. Patient was also provided an information handout.  Follow-up Return in 4 weeks (on 01/26/2021) for INR, Follow up INR.  03/28/2021, PharmD, CPP  15 minutes spent face-to-face with the patient during the encounter. 50% of time spent on education, including signs/sx bleeding and clotting, as well as food and drug interactions with warfarin. 50% of time was spent on fingerprick POC INR sample collection,processing, results determination, and documentation in [name].

## 2021-01-14 ENCOUNTER — Other Ambulatory Visit: Payer: Self-pay | Admitting: Internal Medicine

## 2021-01-14 ENCOUNTER — Telehealth: Payer: Self-pay | Admitting: Internal Medicine

## 2021-01-14 DIAGNOSIS — R768 Other specified abnormal immunological findings in serum: Secondary | ICD-10-CM

## 2021-01-14 MED ORDER — VALACYCLOVIR HCL 500 MG PO TABS: 500.0000 mg | ORAL_TABLET | Freq: Two times a day (BID) | ORAL | 0 refills | Status: DC

## 2021-01-14 NOTE — Telephone Encounter (Signed)
  [facility] Residency Telephone Encounter Continuity Care Appointment  HPI:   This telephone encounter was created for [name] on 01/14/2021 for the following purpose/cc medication refill.   Past Medical History:  Past Medical History:  Diagnosis Date  . D V T 10/09/2010   Qualifier: Diagnosis of  By: [name] MD, Comer Locket     . DVT (deep venous thrombosis) (HCC)   . Seasonal allergies       ROS:      Assessment / Plan / Recommendations:   Please see A&P under problem oriented charting for assessment of the patient's acute and chronic medical conditions.   As always, pt is advised that if symptoms worsen or new symptoms arise, they should go to an urgent care facility or to to ER for further evaluation.   Consent and Medical Decision Making:   This is a telephone encounter between [name] and [name] on 01/14/2021 for medication refill. The visit was conducted with the patient located at home and Eliezer Bottom at [facility]. The patient's identity was confirmed using their DOB and current address. The patient has consented to being evaluated through a telephone encounter and understands the associated risks (an examination cannot be done and the patient may need to come in for an appointment) / benefits (allows the patient to remain at home, decreasing exposure to coronavirus). I personally spent 5 minutes on medical discussion.    Patient is requesting medication refill on his Valtrex. He reports ~3 day sensation of feeling like he is going to have a herpes outbreak. He has taken valtrex for this in the past with improvement in symptoms and is requesting this again. No fevers/chills. Valtrex Rx sent to pharmacy.

## 2021-01-14 NOTE — Telephone Encounter (Signed)
Agree, we will see him before prescribing another course as he had a course just 2 weeks ago

## 2021-01-17 ENCOUNTER — Encounter: Payer: Self-pay | Admitting: *Deleted

## 2021-01-20 ENCOUNTER — Encounter: Payer: Self-pay | Admitting: Student

## 2021-01-20 NOTE — Telephone Encounter (Signed)
Unable to reach this pt and no rtn phone call.  A letter has been mailed to this pt.

## 2021-01-26 ENCOUNTER — Ambulatory Visit (INDEPENDENT_AMBULATORY_CARE_PROVIDER_SITE_OTHER): Payer: BC Managed Care – PPO | Admitting: Pharmacist

## 2021-01-26 DIAGNOSIS — Z5181 Encounter for therapeutic drug level monitoring: Secondary | ICD-10-CM

## 2021-01-26 DIAGNOSIS — Z7901 Long term (current) use of anticoagulants: Secondary | ICD-10-CM | POA: Diagnosis not present

## 2021-01-26 DIAGNOSIS — I2699 Other pulmonary embolism without acute cor pulmonale: Secondary | ICD-10-CM

## 2021-01-26 LAB — POCT INR: INR: 3.1 — AB (ref 2.0–3.0)

## 2021-01-26 MED ORDER — WARFARIN SODIUM 5 MG PO TABS: ORAL_TABLET | ORAL | 3 refills | Status: DC

## 2021-01-26 NOTE — Patient Instructions (Signed)
Patient instructed to take medications as defined in the Anti-coagulation Track section of this encounter.  Patient instructed to take today's dose.  Patient instructed to take 1 and 1/2  Tablets of your 5mg  peach-colored warfarin tablets by mouth daily at [facility] on Saturdays and Sundays. All other days, take only one (1) tablet Monday through Friday.  Patient verbalized understanding of these instructions.

## 2021-01-26 NOTE — Progress Notes (Signed)
Anticoagulation Management Richard [name] is a 45 y.o. male who reports to the clinic for monitoring of warfarin treatment.    Indication: PE, history of, with infarction; long term current use of warfarin to maintain INR 2.0 - 3.0.  Duration: indefinite Supervising physician: Charissa Bash, MD  Anticoagulation Clinic Visit History: Patient does not report signs/symptoms of bleeding or thromboembolism  Other recent changes: No diet, medications, lifestyle changes endorsed by the patient at this visit.  Anticoagulation Episode Summary    Current INR goal:  2.0-3.0  TTR:  52.6 % (8.8 y)  Next INR check:  03/02/2021  INR from last check:  3.1 (01/26/2021)  Weekly max warfarin dose:  45 mg  Target end date:  Indefinite  INR check location:  Anticoagulation Clinic  Preferred lab:    Send INR reminders to:     Indications   Pulmonary embolism and infarction (HCC) [I26.99] D V T (Resolved) [I80.299] Long term (current) use of anticoagulants [Z79.01]       Comments:          No Known Allergies  Current Outpatient Medications:  .  cetirizine (ZYRTEC) 10 MG tablet, Take 1 tablet (10 mg total) by mouth daily., Disp: 100 tablet, Rfl: 0 .  valACYclovir (VALTREX) 500 MG tablet, Take 1 tablet (500 mg total) by mouth 2 (two) times daily. for 3 days, Disp: 6 tablet, Rfl: 0 .  warfarin (COUMADIN) 5 MG tablet, Take one (1) of your 5mg  peach-colored tablets daily at [facility], Monday through Friday. On Saturdays and Sundays, take one-and-one half (1&1/2) of your tablets., Disp: 32 tablet, Rfl: 3 Past Medical History:  Diagnosis Date  . D V T 10/09/2010   Qualifier: Diagnosis of  By: 10/11/2010 MD, [name]     . DVT (deep venous thrombosis) (HCC)   . Seasonal allergies    Social History   Socioeconomic History  . Marital status: Single    Spouse name: Not on file  . Number of children: Not on file  . Years of education: Not on file  . Highest education level: Not on file  Occupational History  .  Not on file  Tobacco Use  . Smoking status: Former Smoker    Years: 1.00    Types: Cigarettes, Cigars    Quit date: 04/05/2012    Years since quitting: 8.8  . Smokeless tobacco: Never Used  Substance and Sexual Activity  . Alcohol use: No    Alcohol/week: 0.0 standard drinks  . Drug use: No  . Sexual activity: Not on file  Other Topics Concern  . Not on file  Social History Narrative  . Not on file   Social Determinants of Health   Financial Resource Strain: Not on file  Food Insecurity: Not on file  Transportation Needs: Not on file  Physical Activity: Not on file  Stress: Not on file  Social Connections: Not on file   Family History  Problem Relation Age of Onset  . Other Mother   . Other Father   . Pulmonary embolism Maternal Uncle     ASSESSMENT Recent Results: The most recent result is correlated with 45 mg per week: Lab Results  Component Value Date   INR 3.1 (A) 01/26/2021   INR 3.8 (A) 12/29/2020   INR 2.0 12/01/2020   PROTIME 20.4 (H) 03/16/2011    Anticoagulation Dosing: Description   Take 1 and 1/2  Tablets of your 5mg  peach-colored warfarin tablets by mouth daily at [facility] on Saturdays and Sundays.  All other days, take only one (1) tablet Monday through Friday.      INR today: Supratherapeutic  PLAN Weekly dose was decreased by 11% to 40 mg per week  Patient Instructions  Patient instructed to take medications as defined in the Anti-coagulation Track section of this encounter.  Patient instructed to take today's dose.  Patient instructed to take 1 and 1/2  Tablets of your 5mg  peach-colored warfarin tablets by mouth daily at [facility] on Saturdays and Sundays. All other days, take only one (1) tablet Monday through Friday.  Patient verbalized understanding of these instructions.    Patient advised to contact clinic or seek medical attention if signs/symptoms of bleeding or thromboembolism occur.  Patient verbalized understanding by repeating back  information and was advised to contact me if further medication-related questions arise. Patient was also provided an information handout.  Follow-up Return in 5 weeks (on 03/02/2021) for Follow up INR.  05/03/2021, PharmD, CPP  15 minutes spent face-to-face with the patient during the encounter. 50% of time spent on education, including signs/sx bleeding and clotting, as well as food and drug interactions with warfarin. 50% of time was spent on fingerprick POC INR sample collection,processing, results determination, and documentation in [name].

## 2021-01-28 NOTE — Progress Notes (Signed)
Patient's management reviewed and I agree with assessment and plan. 

## 2021-02-02 ENCOUNTER — Other Ambulatory Visit: Payer: Self-pay | Admitting: Internal Medicine

## 2021-02-02 DIAGNOSIS — R768 Other specified abnormal immunological findings in serum: Secondary | ICD-10-CM

## 2021-02-02 NOTE — Telephone Encounter (Signed)
-   refilled Valtrex

## 2021-02-16 ENCOUNTER — Other Ambulatory Visit: Payer: Self-pay | Admitting: Internal Medicine

## 2021-02-16 DIAGNOSIS — R768 Other specified abnormal immunological findings in serum: Secondary | ICD-10-CM

## 2021-03-02 ENCOUNTER — Other Ambulatory Visit: Payer: Self-pay | Admitting: Pharmacist

## 2021-03-02 ENCOUNTER — Ambulatory Visit (INDEPENDENT_AMBULATORY_CARE_PROVIDER_SITE_OTHER): Payer: BC Managed Care – PPO | Admitting: Pharmacist

## 2021-03-02 DIAGNOSIS — Z7901 Long term (current) use of anticoagulants: Secondary | ICD-10-CM

## 2021-03-02 DIAGNOSIS — I2699 Other pulmonary embolism without acute cor pulmonale: Secondary | ICD-10-CM

## 2021-03-02 LAB — POCT INR: INR: 1.2 — AB (ref 2.0–3.0)

## 2021-03-02 MED ORDER — WARFARIN SODIUM 5 MG PO TABS: ORAL_TABLET | ORAL | 3 refills | Status: DC

## 2021-03-02 NOTE — Patient Instructions (Signed)
Patient instructed to take medications as defined in the Anti-coagulation Track section of this encounter.  Patient instructed to take today's dose.  Patient instructed to take 1 and 1/2  Tablets of your 5mg  peach-colored warfarin tablets by mouth daily at 6PM EXCEPT ON Mondays, Wednesdays and Fridays--take ONLY one (1) tablet on Mondays, Wednesdays and Fridays.   Patient verbalized understanding of these instructions.

## 2021-03-02 NOTE — Progress Notes (Signed)
Anticoagulation Management Richard [name] is a 45 y.o. male who reports to the clinic for monitoring of warfarin treatment.    Indication: PE , History of, with infarction. Long term current use of warfarin to maintain INR 2.0 - 3.0. Duration: indefinite Supervising physician:  Reymundo Poll, MD  Anticoagulation Clinic Visit History: Patient does not report signs/symptoms of bleeding or thromboembolism.   Other recent changes: No diet, medications, lifestyle changes endorsed by the patient at this visit.  Anticoagulation Episode Summary     Current INR goal:  2.0-3.0  TTR:  52.6 % (8.9 y)  Next INR check:  03/30/2021  INR from last check:  1.2 (03/02/2021)  Weekly max warfarin dose:  45 mg  Target end date:  Indefinite  INR check location:  Anticoagulation Clinic  Preferred lab:    Send INR reminders to:     Indications   Pulmonary embolism and infarction (HCC) [I26.99] D V T (Resolved) [I80.299] Long term (current) use of anticoagulants [Z79.01]        Comments:           No Known Allergies  Current Outpatient Medications:    cetirizine (ZYRTEC) 10 MG tablet, Take 1 tablet (10 mg total) by mouth daily., Disp: 100 tablet, Rfl: 0   valACYclovir (VALTREX) 500 MG tablet, Take 1 tablet by mouth twice daily (Patient not taking: Reported on 03/02/2021), Disp: 6 tablet, Rfl: 0   warfarin (COUMADIN) 5 MG tablet, Take one (1) of your 5mg  peach-colored tablets daily at [facility], Monday through Friday. On Saturdays and Sundays, take one-and-one half (1&1/2) of your tablets., Disp: 32 tablet, Rfl: 3 Past Medical History:  Diagnosis Date   D V T 10/09/2010   Qualifier: Diagnosis of  By: 10/11/2010 MD, [name].      DVT (deep venous thrombosis) (HCC)    Seasonal allergies    Social History   Socioeconomic History   Marital status: Single    Spouse name: Not on file   Number of children: Not on file   Years of education: Not on file   Highest education level: Not on file  Occupational  History   Not on file  Tobacco Use   Smoking status: Former    Years: 1.00    Pack years: 0.00    Types: Cigarettes, Cigars    Quit date: 04/05/2012    Years since quitting: 8.9   Smokeless tobacco: Never  Substance and Sexual Activity   Alcohol use: No    Alcohol/week: 0.0 standard drinks   Drug use: No   Sexual activity: Not on file  Other Topics Concern   Not on file  Social History Narrative   Not on file   Social Determinants of Health   Financial Resource Strain: Not on file  Food Insecurity: Not on file  Transportation Needs: Not on file  Physical Activity: Not on file  Stress: Not on file  Social Connections: Not on file   Family History  Problem Relation Age of Onset   Other Mother    Other Father    Pulmonary embolism Maternal Uncle     ASSESSMENT Recent Results: The most recent result is correlated with 40 mg per week: Lab Results  Component Value Date   INR 1.2 (A) 03/02/2021   INR 3.1 (A) 01/26/2021   INR 3.8 (A) 12/29/2020   PROTIME 20.4 (H) 03/16/2011    Anticoagulation Dosing: Description   Take 1 and 1/2  Tablets of your 5mg  peach-colored warfarin tablets by  mouth daily at [facility] EXCEPT ON Mondays, Wednesdays and Fridays--take ONLY one (1) tablet on Mondays, Wednesdays and Fridays.       INR today: Subtherapeutic  PLAN Weekly dose was increased by 12% to 45 mg per week  Patient Instructions  Patient instructed to take medications as defined in the Anti-coagulation Track section of this encounter.  Patient instructed to take today's dose.  Patient instructed to take 1 and 1/2  Tablets of your 5mg  peach-colored warfarin tablets by mouth daily at 6PM EXCEPT ON Mondays, Wednesdays and Fridays--take ONLY one (1) tablet on Mondays, Wednesdays and Fridays.   Patient verbalized understanding of these instructions.   Patient advised to contact clinic or seek medical attention if signs/symptoms of bleeding or thromboembolism occur.  Patient  verbalized understanding by repeating back information and was advised to contact me if further medication-related questions arise. Patient was also provided an information handout.  Follow-up Return in 4 weeks (on 03/30/2021) for Follow up INR.  05/30/2021, PharmD, CPP  15 minutes spent face-to-face with the patient during the encounter. 50% of time spent on education, including signs/sx bleeding and clotting, as well as food and drug interactions with warfarin. 50% of time was spent on fingerprick POC INR sample collection,processing, results determination, and documentation in [name].

## 2021-03-02 NOTE — Telephone Encounter (Signed)
Patient requested refill on warfarin while in attendance at anticoagulation management clinic today.

## 2021-03-09 NOTE — Progress Notes (Signed)
INTERNAL MEDICINE TEACHING ATTENDING ADDENDUM   I agree with pharmacy recommendations as outlined in their note.   Mckenzi Buonomo, MD  

## 2021-03-23 ENCOUNTER — Other Ambulatory Visit: Payer: Self-pay | Admitting: Internal Medicine

## 2021-03-23 DIAGNOSIS — R768 Other specified abnormal immunological findings in serum: Secondary | ICD-10-CM

## 2021-03-23 NOTE — Telephone Encounter (Signed)
Pt has been seen in coumadin clinic, but last visit with MD was on 07/30/2019. Has upcoming coumadin clinic appt on 08/08-will send notice to front office to see if pt can also be seen by MD at that time

## 2021-03-30 ENCOUNTER — Ambulatory Visit (INDEPENDENT_AMBULATORY_CARE_PROVIDER_SITE_OTHER): Payer: BC Managed Care – PPO | Admitting: Pharmacist

## 2021-03-30 ENCOUNTER — Ambulatory Visit (INDEPENDENT_AMBULATORY_CARE_PROVIDER_SITE_OTHER): Payer: BC Managed Care – PPO | Admitting: Student

## 2021-03-30 ENCOUNTER — Other Ambulatory Visit: Payer: Self-pay

## 2021-03-30 ENCOUNTER — Encounter: Payer: Self-pay | Admitting: Student

## 2021-03-30 VITALS — BP 112/88 | HR 84 | Temp 97.8°F | Ht 72.0 in | Wt 207.9 lb

## 2021-03-30 DIAGNOSIS — I2699 Other pulmonary embolism without acute cor pulmonale: Secondary | ICD-10-CM | POA: Diagnosis not present

## 2021-03-30 DIAGNOSIS — Z0000 Encounter for general adult medical examination without abnormal findings: Secondary | ICD-10-CM

## 2021-03-30 DIAGNOSIS — Z7901 Long term (current) use of anticoagulants: Secondary | ICD-10-CM

## 2021-03-30 LAB — POCT GLYCOSYLATED HEMOGLOBIN (HGB A1C): Hemoglobin A1C: 5.5 % (ref 4.0–5.6)

## 2021-03-30 LAB — POCT INR: INR: 2.6 (ref 2.0–3.0)

## 2021-03-30 LAB — GLUCOSE, CAPILLARY: Glucose-Capillary: 101 mg/dL — ABNORMAL HIGH (ref 70–99)

## 2021-03-30 MED ORDER — WARFARIN SODIUM 5 MG PO TABS: ORAL_TABLET | ORAL | 3 refills | Status: DC

## 2021-03-30 NOTE — Progress Notes (Signed)
   CC: Routine follow-up.  Cholesterol checkup  HPI:  [name] is a 45 y.o. with past medical history of PE on warfarin who presents to clinic today for routine follow-up.  Please see problem based charting for detail  Past Medical History:  Diagnosis Date   D V T 10/09/2010   Qualifier: Diagnosis of  By: [name] MD, Comer Locket.      DVT (deep venous thrombosis) (HCC)    Seasonal allergies    Review of Systems:   Per HPI  Physical Exam:  Vitals:   03/30/21 1440  BP: 112/88  Pulse: 84  Temp: 97.8 F (36.6 C)  TempSrc: Oral  SpO2: 95%  Weight: 207 lb 14.4 oz (94.3 kg)  Height: 6' (1.829 m)   Physical Exam Constitutional:      General: He is not in acute distress.    Appearance: He is not toxic-appearing.  HENT:     Head: Normocephalic.  Eyes:     Conjunctiva/sclera: Conjunctivae normal.  Cardiovascular:     Rate and Rhythm: Normal rate and regular rhythm.     Heart sounds: Normal heart sounds.  Pulmonary:     Effort: Pulmonary effort is normal. No respiratory distress.     Breath sounds: Normal breath sounds. No wheezing.  Musculoskeletal:     Comments: No pain to palpation of bilateral LE.  No erythema or swelling  Skin:    General: Skin is warm.     Coloration: Skin is not jaundiced.  Neurological:     Mental Status: He is alert and oriented to person, place, and time.  Psychiatric:        Mood and Affect: Mood normal.        Behavior: Behavior normal.     Assessment & Plan:   See Encounters Tab for problem based charting.  Patient discussed with Dr. Criselda Peaches

## 2021-03-30 NOTE — Patient Instructions (Signed)
Mr. Levels,  It was nice seeing you in the clinic today.  Here is a summary of what we talked about:  1.  Pulmonary embolism: Please continue taking warfarin and follow-up with Dr. Michaell Cowing schedule.  2.  I will check cholesterol, A1c and hepatitis C today.  Please return in 6 months  Take care,  Dr. Cyndie Chime

## 2021-03-30 NOTE — Assessment & Plan Note (Addendum)
Check lipid panel and A1c today. Check hepatitis C Will be due for colonoscopy soon  Addendum A1c 5.5 Hep C negative 10 years ASCVD 1.8-3.2%.  No statin indicated

## 2021-03-30 NOTE — Patient Instructions (Signed)
Patient instructed to take medications as defined in the Anti-coagulation Track section of this encounter.  Patient instructed to take today's dose.  Patient instructed to take 1 and 1/2  tablets of your 5mg  peach-colored warfarin tablets on Sundays, Mondays, Wednesdays and Fridays. On Tuesdays, Thursdays and Saturdays--take only one (1) of your 5mg  peach-colored warfarin tablets.  Patient verbalized understanding of these instructions.

## 2021-03-30 NOTE — Assessment & Plan Note (Signed)
Patient with history of PE.  First episode was in 2012.  Hypercoagulable studies were unremarkable.  He had another episode in 2013 due to medication nonadherence.  Currently on warfarin.  Last seen Dr. Alexandria Lodge in July.  He denies chest pain, shortness of breath, LE edema or swelling.  Reports compliant with his medication.  -Continue warfarin

## 2021-03-30 NOTE — Progress Notes (Signed)
Anticoagulation Management Richard [name] is a 45 y.o. male who reports to the clinic for monitoring of warfarin treatment.    Indication: PE, history of with infarction, current long term use of oral anticoagulant warfarin to maintain INR 2.0 - 3.0.  Duration: indefinite Supervising physician: Debe Coder  Anticoagulation Clinic Visit History: Patient does not report signs/symptoms of bleeding or thromboembolism  Other recent changes: No diet, medications, lifestyle changes endorsed at this visit.  Anticoagulation Episode Summary     Current INR goal:  2.0-3.0  TTR:  52.5 % (8.9 y)  Next INR check:  05/04/2021  INR from last check:  2.6 (03/30/2021)  Weekly max warfarin dose:  45 mg  Target end date:  Indefinite  INR check location:  Anticoagulation Clinic  Preferred lab:    Send INR reminders to:     Indications   Pulmonary embolism and infarction (HCC) [I26.99] D V T (Resolved) [I80.299] Long term (current) use of anticoagulants [Z79.01]        Comments:           No Known Allergies  Current Outpatient Medications:    valACYclovir (VALTREX) 500 MG tablet, Take 1 tablet by mouth twice daily, Disp: 6 tablet, Rfl: 0   warfarin (COUMADIN) 5 MG tablet, Take one-and one-half (1&1/2) of your 5mg  peach-colored warfarin tablets on Sundays, Mondays, Wednesdays and Fridays. On Tuesdays, Thursdays and Saturdays--take ONLY ONE (1) tablet., Disp: 44 tablet, Rfl: 3 Past Medical History:  Diagnosis Date   D V T 10/09/2010   Qualifier: Diagnosis of  By: 10/11/2010 MD, [name].      DVT (deep venous thrombosis) (HCC)    Seasonal allergies    Social History   Socioeconomic History   Marital status: Single    Spouse name: Not on file   Number of children: Not on file   Years of education: Not on file   Highest education level: Not on file  Occupational History   Not on file  Tobacco Use   Smoking status: Former    Years: 1.00    Types: Cigarettes, Cigars    Quit date: 04/05/2012     Years since quitting: 8.9   Smokeless tobacco: Never  Substance and Sexual Activity   Alcohol use: No    Alcohol/week: 0.0 standard drinks   Drug use: No   Sexual activity: Not on file  Other Topics Concern   Not on file  Social History Narrative   Not on file   Social Determinants of Health   Financial Resource Strain: Not on file  Food Insecurity: Not on file  Transportation Needs: Not on file  Physical Activity: Not on file  Stress: Not on file  Social Connections: Not on file   Family History  Problem Relation Age of Onset   Other Mother    Other Father    Pulmonary embolism Maternal Uncle     ASSESSMENT Recent Results: The most recent result is correlated with 45 mg per week: Lab Results  Component Value Date   INR 2.6 03/30/2021   INR 1.2 (A) 03/02/2021   INR 3.1 (A) 01/26/2021   PROTIME 20.4 (H) 03/16/2011    Anticoagulation Dosing: Description   Take 1 and 1/2  tablets of your 5mg  peach-colored warfarin tablets on Sundays, Mondays, Wednesdays and Fridays. On Tuesdays, Thursdays and Saturdays--take only one (1) of your 5mg  peach-colored warfarin tablets.      INR today: Therapeutic  PLAN Weekly dose was unchanged, will continue to take 45mg   warfarin/wk.  Patient Instructions  Patient instructed to take medications as defined in the Anti-coagulation Track section of this encounter.  Patient instructed to take today's dose.  Patient instructed to take 1 and 1/2  tablets of your 5mg  peach-colored warfarin tablets on Sundays, Mondays, Wednesdays and Fridays. On Tuesdays, Thursdays and Saturdays--take only one (1) of your 5mg  peach-colored warfarin tablets.  Patient verbalized understanding of these instructions.   Patient advised to contact clinic or seek medical attention if signs/symptoms of bleeding or thromboembolism occur.  Patient verbalized understanding by repeating back information and was advised to contact me if further medication-related  questions arise. Patient was also provided an information handout.  Follow-up Return in 5 weeks (on 05/04/2021) for Follow up INR.  , PharmD, CPP  15 minutes spent face-to-face with the patient during the encounter. 50% of time spent on education, including signs/sx bleeding and clotting, as well as food and drug interactions with warfarin. 50% of time was spent on fingerprick POC INR sample collection,processing, results determination, and documentation in 07/04/2021.

## 2021-03-31 LAB — LIPID PANEL
Chol/HDL Ratio: 4.5 ratio (ref 0.0–5.0)
Cholesterol, Total: 242 mg/dL — ABNORMAL HIGH (ref 100–199)
HDL: 54 mg/dL (ref >39)
LDL Chol Calc (NIH): 152 mg/dL — ABNORMAL HIGH (ref 0–99)
Triglycerides: 198 mg/dL — ABNORMAL HIGH (ref 0–149)
VLDL Cholesterol Cal: 36 mg/dL (ref 5–40)

## 2021-03-31 LAB — HEPATITIS C ANTIBODY: Hep C Virus Ab: <0.1 s/co ratio (ref 0.0–0.9)

## 2021-04-10 DIAGNOSIS — X58XXXA Exposure to other specified factors, initial encounter: Secondary | ICD-10-CM | POA: Diagnosis not present

## 2021-04-10 DIAGNOSIS — M7989 Other specified soft tissue disorders: Secondary | ICD-10-CM | POA: Diagnosis not present

## 2021-04-10 DIAGNOSIS — S8012XA Contusion of left lower leg, initial encounter: Secondary | ICD-10-CM | POA: Diagnosis not present

## 2021-04-11 NOTE — Progress Notes (Signed)
Internal Medicine Clinic Attending  Case discussed with Dr. Nguyen  At the time of the visit.  We reviewed the resident's history and exam and pertinent patient test results.  I agree with the assessment, diagnosis, and plan of care documented in the resident's note. 

## 2021-04-11 NOTE — Progress Notes (Signed)
Evaluation and management procedures were performed by the Clinical Pharmacy Practitioner under my supervision and collaboration. I have reviewed the Practitioner's note and chart, and I agree with the management and plan as documented above. ° °

## 2021-04-17 ENCOUNTER — Other Ambulatory Visit: Payer: Self-pay | Admitting: Internal Medicine

## 2021-04-17 DIAGNOSIS — R768 Other specified abnormal immunological findings in serum: Secondary | ICD-10-CM

## 2021-04-23 NOTE — Telephone Encounter (Signed)
valACYclovir (VALTREX) 500 MG tablet, REFILL REQUEST @ [id number] - [place], [place] - [address]

## 2021-05-04 ENCOUNTER — Ambulatory Visit (INDEPENDENT_AMBULATORY_CARE_PROVIDER_SITE_OTHER): Payer: BC Managed Care – PPO | Admitting: Pharmacist

## 2021-05-04 DIAGNOSIS — I2699 Other pulmonary embolism without acute cor pulmonale: Secondary | ICD-10-CM

## 2021-05-04 DIAGNOSIS — Z7901 Long term (current) use of anticoagulants: Secondary | ICD-10-CM | POA: Diagnosis not present

## 2021-05-04 LAB — POCT INR: INR: 2.1 (ref 2.0–3.0)

## 2021-05-04 MED ORDER — WARFARIN SODIUM 5 MG PO TABS: ORAL_TABLET | ORAL | 3 refills | Status: DC

## 2021-05-04 NOTE — Patient Instructions (Signed)
Patient instructed to take medications as defined in the Anti-coagulation Track section of this encounter.  Patient instructed to take today's dose.  Patient instructed to take 1 and 1/2  tablets of your 5mg  peach-colored warfarin tablets on Sundays, Mondays, Tuesdays, Wednesdays and Fridays. On Thursdays and Saturdays--take only one (1) of your 5mg  peach-colored warfarin tablets.  Patient verbalized understanding of these instructions.

## 2021-05-04 NOTE — Progress Notes (Signed)
Anticoagulation Management Richard [name] is a 45 y.o. male who reports to the clinic for monitoring of warfarin treatment.    Indication: PE, history of with infarction; long term current use of warfarin oral anticoagulant to maintain INR 2.0 - 3.0.  Duration: indefinite Supervising physician: Carlynn Purl  Anticoagulation Clinic Visit History: Patient does not report signs/symptoms of bleeding or thromboembolism  Other recent changes: No diet, medications, lifestyle changes endorsed by tthe patient at this visit.  Anticoagulation Episode Summary     Current INR goal:  2.0-3.0  TTR:  53.0 % (9 y)  Next INR check:  06/08/2021  INR from last check:  2.1 (05/04/2021)  Weekly max warfarin dose:  45 mg  Target end date:  Indefinite  INR check location:  Anticoagulation Clinic  Preferred lab:    Send INR reminders to:     Indications   Pulmonary embolism and infarction (HCC) [I26.99] D V T (Resolved) [I80.299] Long term (current) use of anticoagulants [Z79.01]        Comments:           No Known Allergies  Current Outpatient Medications:    valACYclovir (VALTREX) 500 MG tablet, Take 1 tablet by mouth twice daily, Disp: 6 tablet, Rfl: 0   warfarin (COUMADIN) 5 MG tablet, Take one-and one-half (1&1/2) of your 5mg  peach-colored warfarin tablets on Sundays, Mondays, Tuesdays,Wednesdays and Fridays. On Thursdays and Saturdays--take ONLY ONE (1) tablet., Disp: 38 tablet, Rfl: 3 Past Medical History:  Diagnosis Date   D V T 10/09/2010   Qualifier: Diagnosis of  By: 10/11/2010 MD, [name].      DVT (deep venous thrombosis) (HCC)    Seasonal allergies    Social History   Socioeconomic History   Marital status: Single    Spouse name: Not on file   Number of children: Not on file   Years of education: Not on file   Highest education level: Not on file  Occupational History   Not on file  Tobacco Use   Smoking status: Former    Years: 1.00    Types: Cigarettes, Cigars    Quit  date: 04/05/2012    Years since quitting: 9.0   Smokeless tobacco: Never  Substance and Sexual Activity   Alcohol use: No    Alcohol/week: 0.0 standard drinks   Drug use: No   Sexual activity: Not on file  Other Topics Concern   Not on file  Social History Narrative   Not on file   Social Determinants of Health   Financial Resource Strain: Not on file  Food Insecurity: Not on file  Transportation Needs: Not on file  Physical Activity: Not on file  Stress: Not on file  Social Connections: Not on file   Family History  Problem Relation Age of Onset   Other Mother    Other Father    Pulmonary embolism Maternal Uncle     ASSESSMENT Recent Results: The most recent result is correlated with 45 mg per week: Lab Results  Component Value Date   INR 2.1 05/04/2021   INR 2.6 03/30/2021   INR 1.2 (A) 03/02/2021   PROTIME 20.4 (H) 03/16/2011    Anticoagulation Dosing: Description   Take 1 and 1/2  tablets of your 5mg  peach-colored warfarin tablets on Sundays, Mondays, Tuesdays, Wednesdays and Fridays. On Thursdays and Saturdays--take only one (1) of your 5mg  peach-colored warfarin tablets.      INR today: Therapeutic  PLAN Weekly dose was increased by 6% to 47.5  mg per week  Patient Instructions  Patient instructed to take medications as defined in the Anti-coagulation Track section of this encounter.  Patient instructed to take today's dose.  Patient instructed to take 1 and 1/2  tablets of your 5mg  peach-colored warfarin tablets on Sundays, Mondays, Tuesdays, Wednesdays and Fridays. On Thursdays and Saturdays--take only one (1) of your 5mg  peach-colored warfarin tablets.  Patient verbalized understanding of these instructions.   Patient advised to contact clinic or seek medical attention if signs/symptoms of bleeding or thromboembolism occur.  Patient verbalized understanding by repeating back information and was advised to contact me if further medication-related  questions arise. Patient was also provided an information handout.  Follow-up Return in 4 weeks (on 06/01/2021) for Follow up INR.  , PharmD, CPP  15 minutes spent face-to-face with the patient during the encounter. 50% of time spent on education, including signs/sx bleeding and clotting, as well as food and drug interactions with warfarin. 50% of time was spent on fingerprick POC INR sample collection,processing, results determination, and documentation in 08/01/2021.

## 2021-05-26 ENCOUNTER — Other Ambulatory Visit: Payer: Self-pay | Admitting: Student

## 2021-05-26 DIAGNOSIS — R768 Other specified abnormal immunological findings in serum: Secondary | ICD-10-CM

## 2021-06-01 ENCOUNTER — Ambulatory Visit (INDEPENDENT_AMBULATORY_CARE_PROVIDER_SITE_OTHER): Payer: BC Managed Care – PPO | Admitting: Pharmacist

## 2021-06-01 DIAGNOSIS — Z7901 Long term (current) use of anticoagulants: Secondary | ICD-10-CM | POA: Diagnosis not present

## 2021-06-01 DIAGNOSIS — I2699 Other pulmonary embolism without acute cor pulmonale: Secondary | ICD-10-CM

## 2021-06-01 LAB — POCT INR: INR: 2.1 (ref 2.0–3.0)

## 2021-06-01 MED ORDER — WARFARIN SODIUM 5 MG PO TABS: 7.5000 mg | ORAL_TABLET | Freq: Every day | ORAL | 3 refills | Status: DC

## 2021-06-01 NOTE — Progress Notes (Signed)
INTERNAL MEDICINE TEACHING ATTENDING ADDENDUM - Sharifah Champine M.D  Duration- indefinite, Indication- recurrent PE, INR- therapeutic. Agree with pharmacy recommendations as outlined in their note.      

## 2021-06-01 NOTE — Progress Notes (Signed)
Anticoagulation Management Richard [name] is a 45 y.o. male who reports to the clinic for monitoring of warfarin treatment.    Indication: PE , History of with infarction; Long term current use of warfarin oral anticoagulant to maintain INR 2.0 - 3.0.  Duration: indefinite Supervising physician: Earl Lagos  Anticoagulation Clinic Visit History: Patient does not report signs/symptoms of bleeding or thromboembolism  Other recent changes: No diet, medications, lifestyle changes endorsed at this visit.  Anticoagulation Episode Summary     Current INR goal:  2.0-3.0  TTR:  53.4 % (9.1 y)  Next INR check:  06/29/2021  INR from last check:  2.1 (06/01/2021)  Weekly max warfarin dose:  45 mg  Target end date:  Indefinite  INR check location:  Anticoagulation Clinic  Preferred lab:    Send INR reminders to:     Indications   Pulmonary embolism and infarction (HCC) [I26.99] D V T (Resolved) [I80.299] Long term (current) use of anticoagulants [Z79.01]        Comments:           No Known Allergies  Current Outpatient Medications:    warfarin (COUMADIN) 5 MG tablet, Take one-and one-half (1&1/2) of your 5mg  peach-colored warfarin tablets on Sundays, Mondays, Tuesdays,Wednesdays and Fridays. On Thursdays and Saturdays--take ONLY ONE (1) tablet., Disp: 38 tablet, Rfl: 3   valACYclovir (VALTREX) 500 MG tablet, Take 1 tablet by mouth twice daily (Patient not taking: Reported on 06/01/2021), Disp: 6 tablet, Rfl: 0 Past Medical History:  Diagnosis Date   D V T 10/09/2010   Qualifier: Diagnosis of  By: 10/11/2010 MD, [name]      DVT (deep venous thrombosis) (HCC)    Seasonal allergies    Social History   Socioeconomic History   Marital status: Single    Spouse name: Not on file   Number of children: Not on file   Years of education: Not on file   Highest education level: Not on file  Occupational History   Not on file  Tobacco Use   Smoking status: Former    Years: 1.00     Types: Cigarettes, Cigars    Quit date: 04/05/2012    Years since quitting: 9.1   Smokeless tobacco: Never  Substance and Sexual Activity   Alcohol use: No    Alcohol/week: 0.0 standard drinks   Drug use: No   Sexual activity: Not on file  Other Topics Concern   Not on file  Social History Narrative   Not on file   Social Determinants of Health   Financial Resource Strain: Not on file  Food Insecurity: Not on file  Transportation Needs: Not on file  Physical Activity: Not on file  Stress: Not on file  Social Connections: Not on file   Family History  Problem Relation Age of Onset   Other Mother    Other Father    Pulmonary embolism Maternal Uncle     ASSESSMENT Recent Results: The most recent result is correlated with 42.5 mg per week: Lab Results  Component Value Date   INR 2.1 06/01/2021   INR 2.1 05/04/2021   INR 2.6 03/30/2021   PROTIME 20.4 (H) 03/16/2011    Anticoagulation Dosing: Description   Take 1 and 1/2  tablets of your 5mg  peach-colored warfarin tablets by mouth, once-daily at 6PM.       INR today: Therapeutic  PLAN Weekly dose was increased by 11% to 52.5 mg per week  Patient Instructions  Patient instructed to take  medications as defined in the Anti-coagulation Track section of this encounter.  Patient instructed to take today's dose.  Patient instructed to take one-and-one-half (1&1/2) of your 5mg  peach-colored warfarin tablets, by mouth, once-daily at [facility]. Patient verbalized understanding of these instructions.   Patient advised to contact clinic or seek medical attention if signs/symptoms of bleeding or thromboembolism occur.  Patient verbalized understanding by repeating back information and was advised to contact me if further medication-related questions arise. Patient was also provided an information handout.  Follow-up Return in 4 weeks (on 06/29/2021) for Follow up INR.  13/02/2021, PharmD, CPP  15 minutes spent face-to-face  with the patient during the encounter. 50% of time spent on education, including signs/sx bleeding and clotting, as well as food and drug interactions with warfarin. 50% of time was spent on fingerprick POC INR sample collection,processing, results determination, and documentation in [name].

## 2021-06-01 NOTE — Patient Instructions (Signed)
Patient instructed to take medications as defined in the Anti-coagulation Track section of this encounter.  Patient instructed to take today's dose.  Patient instructed to take one-and-one-half (1&1/2) of your 5mg  peach-colored warfarin tablets, by mouth, once-daily at [facility]. Patient verbalized understanding of these instructions.

## 2021-06-01 NOTE — Addendum Note (Signed)
Addended by: [name] B on: 06/01/2021 03:16 PM   Modules accepted: Orders

## 2021-06-08 MED ORDER — VALACYCLOVIR HCL 500 MG PO TABS: 500.0000 mg | ORAL_TABLET | Freq: Two times a day (BID) | ORAL | 0 refills | Status: DC

## 2021-06-08 NOTE — Telephone Encounter (Signed)
Received on call page. Patient requesting valtrex for herpes flare. States symptoms started this evening. Has had good improvement with valtrex in the the past. Denies fevers or chills. Sent Valtrex 500 mg twice daily for 3 days sent to [facility] in [place]. Instructed to follow up in clinic if symptoms do not improve with valtrex or are different from his usual herpes flares.

## 2021-06-29 ENCOUNTER — Ambulatory Visit (INDEPENDENT_AMBULATORY_CARE_PROVIDER_SITE_OTHER): Payer: BC Managed Care – PPO | Admitting: Pharmacist

## 2021-06-29 DIAGNOSIS — I2699 Other pulmonary embolism without acute cor pulmonale: Secondary | ICD-10-CM

## 2021-06-29 DIAGNOSIS — Z7901 Long term (current) use of anticoagulants: Secondary | ICD-10-CM

## 2021-06-29 LAB — POCT INR: INR: 2.9 (ref 2.0–3.0)

## 2021-06-29 NOTE — Patient Instructions (Signed)
Patient instructed to take medications as defined in the Anti-coagulation Track section of this encounter.  Patient instructed to take today's dose.  Patient instructed to take 1 and 1/2  tablets of your 5mg  peach-colored warfarin tablets by mouth, once-daily at 6PM--EXCEPT on TUESDAYS. Take only one (1) tablet on TUESDAYS.  Patient verbalized understanding of these instructions.

## 2021-06-29 NOTE — Progress Notes (Signed)
Anticoagulation Management Richard [name] is a 45 y.o. male who reports to the clinic for monitoring of warfarin treatment.    Indication: PE, with infarction, History of; Long term current use of warfarin anticoagulant orally to target INR 2.0 - 3.0.  Duration: indefinite Supervising physician: Carlynn Purl  Anticoagulation Clinic Visit History: Patient does not report signs/symptoms of bleeding or thromboembolism  Other recent changes: No diet, medications, lifestyle changes. Anticoagulation Episode Summary     Current INR goal:  2.0-3.0  TTR:  53.8 % (9.2 y)  Next INR check:  08/03/2021  INR from last check:  2.9 (06/29/2021)  Weekly max warfarin dose:  45 mg  Target end date:  Indefinite  INR check location:  Anticoagulation Clinic  Preferred lab:    Send INR reminders to:     Indications   Pulmonary embolism and infarction (HCC) [I26.99] D V T (Resolved) [I80.299] Long term (current) use of anticoagulants [Z79.01]        Comments:           No Known Allergies  Current Outpatient Medications:    warfarin (COUMADIN) 5 MG tablet, Take 1.5 tablets (7.5 mg total) by mouth daily at 4 PM., Disp: 42 tablet, Rfl: 3 Past Medical History:  Diagnosis Date   D V T 10/09/2010   Qualifier: Diagnosis of  By: [name] MD, Comer Locket      DVT (deep venous thrombosis) (HCC)    Seasonal allergies    Social History   Socioeconomic History   Marital status: Single    Spouse name: Not on file   Number of children: Not on file   Years of education: Not on file   Highest education level: Not on file  Occupational History   Not on file  Tobacco Use   Smoking status: Former    Years: 1.00    Types: Cigarettes, Cigars    Quit date: 04/05/2012    Years since quitting: 9.2   Smokeless tobacco: Never  Substance and Sexual Activity   Alcohol use: No    Alcohol/week: 0.0 standard drinks   Drug use: No   Sexual activity: Not on file  Other Topics Concern   Not on file  Social History  Narrative   Not on file   Social Determinants of Health   Financial Resource Strain: Not on file  Food Insecurity: Not on file  Transportation Needs: Not on file  Physical Activity: Not on file  Stress: Not on file  Social Connections: Not on file   Family History  Problem Relation Age of Onset   Other Mother    Other Father    Pulmonary embolism Maternal Uncle     ASSESSMENT Recent Results: The most recent result is correlated with 52.5 mg per week: Lab Results  Component Value Date   INR 2.9 06/29/2021   INR 2.1 06/01/2021   INR 2.1 05/04/2021   PROTIME 20.4 (H) 03/16/2011    Anticoagulation Dosing: Description   Take 1 and 1/2  tablets of your 5mg  peach-colored warfarin tablets by mouth, once-daily at 6PM--EXCEPT on TUESDAYS. Take only one (1) tablet on TUESDAYS.      INR today: Supratherapeutic  PLAN Weekly dose was decreased by 5% to 50 mg per week  Patient Instructions  Patient instructed to take medications as defined in the Anti-coagulation Track section of this encounter.  Patient instructed to take today's dose.  Patient instructed to take 1 and 1/2  tablets of your 5mg  peach-colored warfarin tablets by  mouth, once-daily at 6PM--EXCEPT on TUESDAYS. Take only one (1) tablet on TUESDAYS.  Patient verbalized understanding of these instructions.   Patient advised to contact clinic or seek medical attention if signs/symptoms of bleeding or thromboembolism occur.  Patient verbalized understanding by repeating back information and was advised to contact me if further medication-related questions arise. Patient was also provided an information handout.  Follow-up Return in 5 weeks (on 08/03/2021) for Follow up INR.  [name], PharmD, CPP  15 minutes spent face-to-face with the patient during the encounter. 50% of time spent on education, including signs/sx bleeding and clotting, as well as food and drug interactions with warfarin. 50% of time was spent on  fingerprick POC INR sample collection,processing, results determination, and documentation in [email or web address].

## 2021-07-23 ENCOUNTER — Other Ambulatory Visit: Payer: Self-pay | Admitting: Student

## 2021-07-23 DIAGNOSIS — R768 Other specified abnormal immunological findings in serum: Secondary | ICD-10-CM

## 2021-08-03 ENCOUNTER — Ambulatory Visit (INDEPENDENT_AMBULATORY_CARE_PROVIDER_SITE_OTHER): Payer: BC Managed Care – PPO | Admitting: Pharmacist

## 2021-08-03 DIAGNOSIS — I2699 Other pulmonary embolism without acute cor pulmonale: Secondary | ICD-10-CM

## 2021-08-03 DIAGNOSIS — Z7901 Long term (current) use of anticoagulants: Secondary | ICD-10-CM | POA: Diagnosis not present

## 2021-08-03 LAB — POCT INR: INR: 3 (ref 2.0–3.0)

## 2021-08-03 MED ORDER — WARFARIN SODIUM 5 MG PO TABS: ORAL_TABLET | ORAL | 3 refills | Status: DC

## 2021-08-03 NOTE — Progress Notes (Signed)
Anticoagulation Management Richard [name] is a 45 y.o. male who reports to the clinic for monitoring of warfarin treatment.    Indication: PE, history of with infarction; long term current use of warfarin oral anticoagulant with target INR 2.0 - 3.0.  Duration: indefinite Supervising physician: Debe Coder  Anticoagulation Clinic Visit History: Patient does not report signs/symptoms of bleeding or thromboembolism  Other recent changes: No diet, medications, lifestyle changes endorsed by the patient at this visit.  Anticoagulation Episode Summary     Current INR goal:  2.0-3.0  TTR:  54.3 % (9.3 y)  Next INR check:  08/31/2021  INR from last check:  3.0 (08/03/2021)  Weekly max warfarin dose:  45 mg  Target end date:  Indefinite  INR check location:  Anticoagulation Clinic  Preferred lab:    Send INR reminders to:     Indications   Pulmonary embolism and infarction (HCC) [I26.99] D V T (Resolved) [I80.299] Long term (current) use of anticoagulants [Z79.01]        Comments:           No Known Allergies  Current Outpatient Medications:    warfarin (COUMADIN) 5 MG tablet, Take 1.5 tablets (7.5 mg total) by mouth daily at 4 PM., Disp: 42 tablet, Rfl: 3   valACYclovir (VALTREX) 500 MG tablet, TAKE 1 TABLET BY MOUTH TWICE DAILY FOR 3 DAYS (Patient not taking: Reported on 08/03/2021), Disp: 6 tablet, Rfl: 0 Past Medical History:  Diagnosis Date   D V T 10/09/2010   Qualifier: Diagnosis of  By: [name] MD, Comer Locket      DVT (deep venous thrombosis) (HCC)    Seasonal allergies    Social History   Socioeconomic History   Marital status: Single    Spouse name: Not on file   Number of children: Not on file   Years of education: Not on file   Highest education level: Not on file  Occupational History   Not on file  Tobacco Use   Smoking status: Former    Years: 1.00    Types: Cigarettes, Cigars    Quit date: 04/05/2012    Years since quitting: 9.3   Smokeless tobacco: Never   Substance and Sexual Activity   Alcohol use: No    Alcohol/week: 0.0 standard drinks   Drug use: No   Sexual activity: Not on file  Other Topics Concern   Not on file  Social History Narrative   Not on file   Social Determinants of Health   Financial Resource Strain: Not on file  Food Insecurity: Not on file  Transportation Needs: Not on file  Physical Activity: Not on file  Stress: Not on file  Social Connections: Not on file   Family History  Problem Relation Age of Onset   Other Mother    Other Father    Pulmonary embolism Maternal Uncle     ASSESSMENT Recent Results: The most recent result is correlated with 50 mg per week: Lab Results  Component Value Date   INR 3.0 08/03/2021   INR 2.9 06/29/2021   INR 2.1 06/01/2021   PROTIME 20.4 (H) 03/16/2011    Anticoagulation Dosing: Description   Take 1 and 1/2  tablets of your 5mg  peach-colored warfarin tablets by mouth, once-daily at 6PM--EXCEPT on TUESDAYS, THURSDAYS and SATURDAYS. Take only one (1) tablet on Tuesdays, Thursdays and Saturdays.      INR today: Therapeutic  PLAN Weekly dose was decreased by 10% to 45 mg per week  Patient Instructions  Patient instructed to take medications as defined in the Anti-coagulation Track section of this encounter.  Patient instructed to take 1 and 1/2  tablets of your 5mg  peach-colored warfarin tablets by mouth, once-daily at 6PM--EXCEPT on TUESDAYS, THURSDAYS and SATURDAYS. Take only one (1) tablet on Tuesdays, Thursdays and Saturdays.  Patient verbalized understanding of these instructions.   Patient advised to contact clinic or seek medical attention if signs/symptoms of bleeding or thromboembolism occur.  Patient verbalized understanding by repeating back information and was advised to contact me if further medication-related questions arise. Patient was also provided an information handout.  Follow-up Return in 4 weeks (on 08/31/2021) for Follow up INR.  10/29/2021, PharmD, CPP  15 minutes spent face-to-face with the patient during the encounter. 50% of time spent on education, including signs/sx bleeding and clotting, as well as food and drug interactions with warfarin. 50% of time was spent on fingerprick POC INR sample collection,processing, results determination, and documentation in [name].

## 2021-08-03 NOTE — Patient Instructions (Signed)
Patient instructed to take medications as defined in the Anti-coagulation Track section of this encounter.  Patient instructed to take 1 and 1/2  tablets of your 5mg  peach-colored warfarin tablets by mouth, once-daily at 6PM--EXCEPT on TUESDAYS, THURSDAYS and SATURDAYS. Take only one (1) tablet on Tuesdays, Thursdays and Saturdays.  Patient verbalized understanding of these instructions.

## 2021-08-06 ENCOUNTER — Other Ambulatory Visit: Payer: Self-pay | Admitting: Student

## 2021-08-06 DIAGNOSIS — R768 Other specified abnormal immunological findings in serum: Secondary | ICD-10-CM

## 2021-08-06 MED ORDER — VALACYCLOVIR HCL 500 MG PO TABS
500.0000 mg | ORAL_TABLET | Freq: Two times a day (BID) | ORAL | 0 refills | Status: DC
Start: 2021-08-06 — End: 2021-08-31

## 2021-08-06 NOTE — Telephone Encounter (Signed)
Call to patient to ask about further need for Valtrex 500 mg.  Did pick up previous dose.  Unable to reach patient.

## 2021-08-06 NOTE — Telephone Encounter (Signed)
Patient called on call provider due to burning pain in the groin that started this morning. States symptoms consistent with herpes flares. Last flare was 1 month ago that resolved with valtrex. Denies fever and chills. Rx sent requested pharmacy.

## 2021-08-10 NOTE — Progress Notes (Signed)
Evaluation and management procedures were performed by the Clinical Pharmacy Practitioner under my supervision and collaboration. I have reviewed the Practitioner's note and chart, and I agree with the management and plan as documented above. ° °

## 2021-08-31 ENCOUNTER — Ambulatory Visit (INDEPENDENT_AMBULATORY_CARE_PROVIDER_SITE_OTHER): Payer: BC Managed Care – PPO | Admitting: Pharmacist

## 2021-08-31 ENCOUNTER — Other Ambulatory Visit: Payer: Self-pay | Admitting: *Deleted

## 2021-08-31 DIAGNOSIS — R768 Other specified abnormal immunological findings in serum: Secondary | ICD-10-CM

## 2021-08-31 DIAGNOSIS — Z7901 Long term (current) use of anticoagulants: Secondary | ICD-10-CM | POA: Diagnosis not present

## 2021-08-31 DIAGNOSIS — I2699 Other pulmonary embolism without acute cor pulmonale: Secondary | ICD-10-CM | POA: Diagnosis not present

## 2021-08-31 LAB — POCT INR: INR: 2.1 (ref 2.0–3.0)

## 2021-08-31 MED ORDER — WARFARIN SODIUM 5 MG PO TABS: ORAL_TABLET | ORAL | 3 refills | Status: DC

## 2021-08-31 MED ORDER — VALACYCLOVIR HCL 500 MG PO TABS: 500.0000 mg | ORAL_TABLET | Freq: Two times a day (BID) | ORAL | 0 refills | Status: AC

## 2021-08-31 NOTE — Patient Instructions (Signed)
Patient instructed to take medications as defined in the Anti-coagulation Track section of this encounter.  Patient instructed to take today's dose.  Patient instructed to take 1 and 1/2  tablets of your 5mg  peach-colored warfarin tablets by mouth, once-daily at 6PM--EXCEPT on TUESDAYS, and THURSDAYS. Take only one (1) tablet on Tuesdays, and Thursdays. Patient verbalized understanding of these instructions.

## 2021-08-31 NOTE — Progress Notes (Signed)
Anticoagulation Management Richard [name] is a 46 y.o. male who reports to the clinic for monitoring of warfarin treatment.    Indication: PE with infarction, history of; long term current use of oral anticoagulant warfarin with goal INR 2.0 - 3.0.  Duration: indefinite Supervising physician:  Charissa Bash, MD  Anticoagulation Clinic Visit History: Patient does not report signs/symptoms of bleeding or thromboembolism  Other recent changes: No diet, medications, lifestyle change identified upon questioning of the patient.  Anticoagulation Episode Summary     Current INR goal:  2.0-3.0  TTR:  54.6 % (9.4 y)  Next INR check:  09/28/2021  INR from last check:  2.1 (08/31/2021)  Weekly max warfarin dose:  45 mg  Target end date:  Indefinite  INR check location:  Anticoagulation Clinic  Preferred lab:    Send INR reminders to:     Indications   Pulmonary embolism and infarction (HCC) [I26.99] D V T (Resolved) [I80.299] Long term (current) use of anticoagulants [Z79.01]        Comments:           No Known Allergies  Current Outpatient Medications:    valACYclovir (VALTREX) 500 MG tablet, Take 1 tablet (500 mg total) by mouth 2 (two) times daily. for 3 days (Patient not taking: Reported on 08/31/2021), Disp: 6 tablet, Rfl: 0   warfarin (COUMADIN) 5 MG tablet, Take 1&1/2 tablets all days of the week--EXCEPT on Tuesdays, and Thursdays---take only one (1) tablet on these days., Disp: 40 tablet, Rfl: 3 Past Medical History:  Diagnosis Date   D V T 10/09/2010   Qualifier: Diagnosis of  By: [name] MD, Comer Locket.      DVT (deep venous thrombosis) (HCC)    Seasonal allergies    Social History   Socioeconomic History   Marital status: Single    Spouse name: Not on file   Number of children: Not on file   Years of education: Not on file   Highest education level: Not on file  Occupational History   Not on file  Tobacco Use   Smoking status: Former    Years: 1.00    Types: Cigarettes,  Cigars    Quit date: 04/05/2012    Years since quitting: 9.4   Smokeless tobacco: Never  Substance and Sexual Activity   Alcohol use: No    Alcohol/week: 0.0 standard drinks   Drug use: No   Sexual activity: Not on file  Other Topics Concern   Not on file  Social History Narrative   Not on file   Social Determinants of Health   Financial Resource Strain: Not on file  Food Insecurity: Not on file  Transportation Needs: Not on file  Physical Activity: Not on file  Stress: Not on file  Social Connections: Not on file   Family History  Problem Relation Age of Onset   Other Mother    Other Father    Pulmonary embolism Maternal Uncle     ASSESSMENT Recent Results: The most recent result is correlated with 45 mg per week: Lab Results  Component Value Date   INR 2.1 08/31/2021   INR 3.0 08/03/2021   INR 2.9 06/29/2021   PROTIME 20.4 (H) 03/16/2011    Anticoagulation Dosing: Description   Take 1 and 1/2  tablets of your 5mg  peach-colored warfarin tablets by mouth, once-daily at 6PM--EXCEPT on TUESDAYS, and THURSDAYS. Take only one (1) tablet on Tuesdays, and Thursdays.      INR today: Therapeutic  PLAN Weekly  dose was increased by 6% to 47.5 mg per week  Patient Instructions  Patient instructed to take medications as defined in the Anti-coagulation Track section of this encounter.  Patient instructed to take today's dose.  Patient instructed to take 1 and 1/2  tablets of your 5mg  peach-colored warfarin tablets by mouth, once-daily at 6PM--EXCEPT on TUESDAYS, and THURSDAYS. Take only one (1) tablet on Tuesdays, and Thursdays. Patient verbalized understanding of these instructions.   Patient advised to contact clinic or seek medical attention if signs/symptoms of bleeding or thromboembolism occur.  Patient verbalized understanding by repeating back information and was advised to contact me if further medication-related questions arise. Patient was also provided an  information handout.  Follow-up Return in 4 weeks (on 09/28/2021) for Follow up INR.  11/26/2021, PharmD, CPP  15 minutes spent face-to-face with the patient during the encounter. 50% of time spent on education, including signs/sx bleeding and clotting, as well as food and drug interactions with warfarin. 50% of time was spent on fingerprick POC INR sample collection,processing, results determination, and documentation in [name].

## 2021-08-31 NOTE — Telephone Encounter (Signed)
Pt was here for his coumadin appt - stated he needs a refill on Valtrex.Thanks

## 2021-09-01 NOTE — Progress Notes (Signed)
INTERNAL MEDICINE TEACHING ATTENDING ADDENDUM   I agree with pharmacy recommendations as outlined in their note.   Yechiel Erny, MD  

## 2021-09-27 DIAGNOSIS — R319 Hematuria, unspecified: Secondary | ICD-10-CM | POA: Diagnosis not present

## 2021-09-27 DIAGNOSIS — R791 Abnormal coagulation profile: Secondary | ICD-10-CM | POA: Diagnosis not present

## 2021-09-28 ENCOUNTER — Ambulatory Visit (INDEPENDENT_AMBULATORY_CARE_PROVIDER_SITE_OTHER): Payer: BC Managed Care – PPO | Admitting: Pharmacist

## 2021-09-28 DIAGNOSIS — I2699 Other pulmonary embolism without acute cor pulmonale: Secondary | ICD-10-CM

## 2021-09-28 DIAGNOSIS — R310 Gross hematuria: Secondary | ICD-10-CM | POA: Diagnosis not present

## 2021-09-28 DIAGNOSIS — Z7901 Long term (current) use of anticoagulants: Secondary | ICD-10-CM

## 2021-09-28 LAB — POCT INR: INR: 3.1 — AB (ref 2.0–3.0)

## 2021-09-28 NOTE — Progress Notes (Signed)
Anticoagulation Management Richard [name] is a 46 y.o. male who reports to the clinic for monitoring of warfarin treatment.    Indication: PE , history of, with infarction; long term current use of warfarin oral anticoagulant with target INR 2.0 - 3.0; Gross hematuria discovered in U/A at [facility] ED on Sunday 5-FEB-23.  Duration: indefinite Supervising physician: Joni Reining  Anticoagulation Clinic Visit History: Patient does report signs/symptoms of bleeding or thromboembolism  Other recent changes: No diet, medications, lifestyle changes except as noted in patient findings.  Anticoagulation Episode Summary     Current INR goal:  2.0-3.0  TTR:  54.9 % (9.4 y)  Next INR check:  10/05/2021  INR from last check:  3.1 (09/28/2021)  Weekly max warfarin dose:  45 mg  Target end date:  Indefinite  INR check location:  Anticoagulation Clinic  Preferred lab:    Send INR reminders to:     Indications   Pulmonary embolism and infarction ([place]) [I26.99] D V T (Resolved) [I80.299] Long term (current) use of anticoagulants [Z79.01]        Comments:           No Known Allergies  Current Outpatient Medications:    warfarin (COUMADIN) 5 MG tablet, Take 1&1/2 tablets all days of the week--EXCEPT on Tuesdays, and Thursdays---take only one (1) tablet on these days., Disp: 40 tablet, Rfl: 3   valACYclovir (VALTREX) 500 MG tablet, Take 1 tablet (500 mg total) by mouth 2 (two) times daily. for 3 days (Patient not taking: Reported on 09/28/2021), Disp: 180 tablet, Rfl: 0 Past Medical History:  Diagnosis Date   D V T 10/09/2010   Qualifier: Diagnosis of  By: Elsworth Soho MD, Leanna Sato.      DVT (deep venous thrombosis) ([place])    Seasonal allergies    Social History   Socioeconomic History   Marital status: Single    Spouse name: Not on file   Number of children: Not on file   Years of education: Not on file   Highest education level: Not on file  Occupational History   Not on file  Tobacco  Use   Smoking status: Former    Years: 1.00    Types: Cigarettes, Cigars    Quit date: 04/05/2012    Years since quitting: 9.4   Smokeless tobacco: Never  Substance and Sexual Activity   Alcohol use: No    Alcohol/week: 0.0 standard drinks   Drug use: No   Sexual activity: Not on file  Other Topics Concern   Not on file  Social History Narrative   Not on file   Social Determinants of Health   Financial Resource Strain: Not on file  Food Insecurity: Not on file  Transportation Needs: Not on file  Physical Activity: Not on file  Stress: Not on file  Social Connections: Not on file   Family History  Problem Relation Age of Onset   Other Mother    Other Father    Pulmonary embolism Maternal Uncle     ASSESSMENT Recent Results: The most recent result is correlated with 47.5 mg per week: Lab Results  Component Value Date   INR 3.1 (A) 09/28/2021   INR 2.1 08/31/2021   INR 3.0 08/03/2021   PROTIME 20.4 (H) 03/16/2011    Anticoagulation Dosing: Description   Take one (1) of your 5mg  peach-colored warfarin tablets at [facility] on Monday, Wednesday and Friday of this week. On Tuesday, Thursday and Sunday--take one-and-one-half (1&1/2) of your 5mg  peach-colored  warfarin tablets. Return to clinic on Monday 13-FEB-23 for repeat INR at 3PM. At 3:15pm you will see Dr. Allyson Sabal.     INR today: Supratherapeutic  PLAN Weekly dose was decreased by 5% to 42.5 mg per week, and yesterday's warfarin dose having been omitted as per instructions of the ED provider at [facility].  Patient Instructions  Patient instructed to take medications as defined in the Anti-coagulation Track section of this encounter.  Patient instructed to take today's dose.  Patient instructed to take one (1) of your 5mg  peach-colored warfarin tablets at [facility] on Monday, Wednesday and Friday of this week. On Tuesday, Thursday and Sunday--take one-and-one-half (1&1/2) of your 5mg  peach-colored warfarin tablets.  Return to clinic on Monday 13-FEB-23 for repeat INR at 3PM. At 3:15pm you will see Dr. Allyson Sabal. Patient was instructed that should he see any more blood in his urine or any other signs or symptoms of bleeding, to call me at [phone], call the clinic, or report to the Emergency Department. Patient verbalized understanding of these instructions.   Patient advised to contact clinic or seek medical attention if signs/symptoms of bleeding or thromboembolism occur.  Patient verbalized understanding by repeating back information and was advised to contact me if further medication-related questions arise. Patient was also provided an information handout.  Follow-up Return in about 1 week (around 10/05/2021) for Follow up INR.  [name], PharmD, CPP---  15 minutes spent face-to-face with the patient during the encounter. 50% of time spent on education, including signs/sx bleeding and clotting, as well as food and drug interactions with warfarin. 50% of time was spent on fingerprick POC INR sample collection,processing, results determination, and documentation in [email or web address]/.

## 2021-09-28 NOTE — Patient Instructions (Signed)
Patient instructed to take medications as defined in the Anti-coagulation Track section of this encounter.  Patient instructed to take today's dose.  Patient instructed to take one (1) of your 5mg  peach-colored warfarin tablets at [facility] on Monday, Wednesday and Friday of this week. On Tuesday, Thursday and Sunday--take one-and-one-half (1&1/2) of your 5mg  peach-colored warfarin tablets. Return to clinic on Monday 13-FEB-23 for repeat INR at 3PM. At 3:15pm you will see Dr. Monday. Patient was instructed that should he see any more blood in his urine or any other signs or symptoms of bleeding, to call me at [phone], call the clinic, or report to the Emergency Department. Patient verbalized understanding of these instructions.

## 2021-09-29 LAB — URINALYSIS, ROUTINE W REFLEX MICROSCOPIC
Bilirubin, UA: NEGATIVE
Glucose, UA: NEGATIVE
Ketones, UA: NEGATIVE
Nitrite, UA: NEGATIVE
Protein,UA: NEGATIVE
Specific Gravity, UA: 1.02 (ref 1.005–1.030)
Urobilinogen, Ur: 1 mg/dL (ref 0.2–1.0)
pH, UA: 5 (ref 5.0–7.5)

## 2021-09-29 LAB — MICROSCOPIC EXAMINATION
Bacteria, UA: NONE SEEN
Casts: NONE SEEN /lpf
RBC, Urine: >30 /hpf — AB (ref 0–2)

## 2021-09-29 NOTE — Addendum Note (Signed)
Addended by: Carlynn Purl C on: 09/29/2021 10:52 AM   Modules accepted: Orders

## 2021-09-30 ENCOUNTER — Telehealth: Payer: Self-pay | Admitting: *Deleted

## 2021-09-30 NOTE — Telephone Encounter (Signed)
Ask to call pt per Tracey,lab. Per Dr [name] pt needs to come in for a urine cx. Called pt - no answer; left message to call the office for a lab appt.

## 2021-10-01 NOTE — Telephone Encounter (Signed)
Called pt again to schedule a lab appt for an urine cx but no answer; left message to call the offie.

## 2021-10-02 NOTE — Progress Notes (Signed)
Evaluation and management procedures were performed by the Clinical Pharmacy Practitioner under my supervision and collaboration. I have reviewed the Practitioner's note and chart, and I agree with the management and plan as documented above. Discussed hematuria, needs workup and scheduled visit, did obtain UA>> some WBC will also need urine culture.

## 2021-10-05 ENCOUNTER — Encounter: Payer: Self-pay | Admitting: Student

## 2021-10-05 ENCOUNTER — Ambulatory Visit (INDEPENDENT_AMBULATORY_CARE_PROVIDER_SITE_OTHER): Payer: BC Managed Care – PPO | Admitting: Pharmacist

## 2021-10-05 ENCOUNTER — Ambulatory Visit (INDEPENDENT_AMBULATORY_CARE_PROVIDER_SITE_OTHER): Payer: BC Managed Care – PPO | Admitting: Student

## 2021-10-05 VITALS — BP 128/64 | HR 88 | Temp 98.2°F | Ht 72.0 in | Wt 216.3 lb

## 2021-10-05 DIAGNOSIS — Z7901 Long term (current) use of anticoagulants: Secondary | ICD-10-CM

## 2021-10-05 DIAGNOSIS — R319 Hematuria, unspecified: Secondary | ICD-10-CM | POA: Diagnosis not present

## 2021-10-05 DIAGNOSIS — R310 Gross hematuria: Secondary | ICD-10-CM | POA: Diagnosis not present

## 2021-10-05 DIAGNOSIS — I2699 Other pulmonary embolism without acute cor pulmonale: Secondary | ICD-10-CM

## 2021-10-05 LAB — POCT INR: INR: 4.5 — AB (ref 2.0–3.0)

## 2021-10-05 NOTE — Progress Notes (Signed)
Anticoagulation Management Richard [name] is a 46 y.o. male who reports to the clinic for monitoring of warfarin treatment.    Indication: PE, History of, with infarction; long term current use of warfarin oral anticoagulant with target INR 2.0 - 3.0.  Duration: indefinite Supervising physician: Carlynn Purl  Anticoagulation Clinic Visit History: Patient does not report signs/symptoms of bleeding or thromboembolism  Other recent changes: No diet, medications, lifestyle changes reported.  Anticoagulation Episode Summary     Current INR goal:  2.0-3.0  TTR:  54.8 % (9.4 y)  Next INR check:  10/12/2021  INR from last check:  4.5 (10/05/2021)  Weekly max warfarin dose:  45 mg  Target end date:  Indefinite  INR check location:  Anticoagulation Clinic  Preferred lab:    Send INR reminders to:     Indications   Pulmonary embolism and infarction ([facility]) [I26.99] D V T (Resolved) [I80.299] Long term (current) use of anticoagulants [Z79.01]        Comments:           No Known Allergies  Current Outpatient Medications:    warfarin (COUMADIN) 5 MG tablet, Take 1&1/2 tablets all days of the week--EXCEPT on Tuesdays, and Thursdays---take only one (1) tablet on these days., Disp: 40 tablet, Rfl: 3   valACYclovir (VALTREX) 500 MG tablet, Take 1 tablet (500 mg total) by mouth 2 (two) times daily. for 3 days (Patient not taking: Reported on 10/05/2021), Disp: 180 tablet, Rfl: 0 Past Medical History:  Diagnosis Date   D V T 10/09/2010   Qualifier: Diagnosis of  By: [name] MD, Comer Locket.      DVT (deep venous thrombosis) ([facility])    Seasonal allergies    Social History   Socioeconomic History   Marital status: Single    Spouse name: Not on file   Number of children: Not on file   Years of education: Not on file   Highest education level: Not on file  Occupational History   Not on file  Tobacco Use   Smoking status: Former    Years: 1.00    Types: Cigarettes, Cigars    Quit date: 04/05/2012     Years since quitting: 9.5   Smokeless tobacco: Never  Substance and Sexual Activity   Alcohol use: No    Alcohol/week: 0.0 standard drinks   Drug use: No   Sexual activity: Not on file  Other Topics Concern   Not on file  Social History Narrative   Not on file   Social Determinants of Health   Financial Resource Strain: Not on file  Food Insecurity: Not on file  Transportation Needs: Not on file  Physical Activity: Not on file  Stress: Not on file  Social Connections: Not on file   Family History  Problem Relation Age of Onset   Other Mother    Other Father    Pulmonary embolism Maternal Uncle     ASSESSMENT Recent Results: The most recent result is correlated with having omitted last Mondays dose (5mg  scheduled for that date) and resumed the following day for a total of 37.5mg  per the remainder of the week: Lab Results  Component Value Date   INR 4.5 (A) 10/05/2021   INR 3.1 (A) 09/28/2021   INR 2.1 08/31/2021   PROTIME 20.4 (H) 03/16/2011    Anticoagulation Dosing: Description   OMIT your warfarin dose for today, Monday February 13th. DO NOT TAKE ANY WARFARIN TODAY. Resume taking your warfarin on Tuesday, February 14th, 2023.  Take one tablet of your 5mg  peach-colored warfarin tablets on Tuesday, Wednesday, Thursday and Friday. On Thursday and Sunday, take one-and-one-half (1&1/2) of your 5mg  peach-colored warfarin tablets. Return to clinic on Monday February 20th, 2023 for a repeat INR.      INR today: Supratherapeutic  PLAN Weekly dose was decreased by 20% to 35 mg per week  Patient Instructions  Patient instructed to take medications as defined in the Anti-coagulation Track section of this encounter.  Patient instructed to OMIT today's dose.  Patient instructed to OMIT your warfarin dose for today, Monday February 13th. DO NOT TAKE ANY WARFARIN TODAY. Resume taking your warfarin on Tuesday, February 14th, 2023. Take one tablet of your 5mg  peach-colored  warfarin tablets on Tuesday, Wednesday, Thursday and Friday. On Thursday and Sunday, take one-and-one-half (1&1/2) of your 5mg  peach-colored warfarin tablets. Return to clinic on Monday February 20th, 2023 for a repeat INR.  Patient verbalized understanding of these instructions.   Patient advised to contact clinic or seek medical attention if signs/symptoms of bleeding or thromboembolism occur.  Patient verbalized understanding by repeating back information and was advised to contact me if further medication-related questions arise. Patient was also provided an information handout.  Follow-up Return in 1 week (on 10/12/2021) for Follow up INR.  , PharmD, CPP  15 minutes spent face-to-face with the patient during the encounter. 50% of time spent on education, including signs/sx bleeding and clotting, as well as food and drug interactions with warfarin. 50% of time was spent on fingerprick POC INR sample collection,processing, results determination, and documentation in Wednesday.

## 2021-10-05 NOTE — Patient Instructions (Signed)
[name],  It was a pleasure seeing you in the clinic today.   I am getting urine sample today to check for a urinary tract infection. If it is negative, I will order a CT scan to evaluate your kidneys. Please come back in 2 weeks for follow up.  Please call our clinic at [phone] if you have any questions or concerns. The best time to call is Monday-Friday from 9am-4pm, but there is someone available 24/7 at the same number. If you need medication refills, please notify your pharmacy one week in advance and they will send [place] a request.   Thank you for letting us take part in your care. We look forward to seeing you next time!

## 2021-10-05 NOTE — Patient Instructions (Signed)
Patient instructed to take medications as defined in the Anti-coagulation Track section of this encounter.  Patient instructed to OMIT today's dose.  Patient instructed to OMIT your warfarin dose for today, Monday February 13th. DO NOT TAKE ANY WARFARIN TODAY. Resume taking your warfarin on Tuesday, February 14th, 2023. Take one tablet of your 5mg  peach-colored warfarin tablets on Tuesday, Wednesday, Thursday and Friday. On Thursday and Sunday, take one-and-one-half (1&1/2) of your 5mg  peach-colored warfarin tablets. Return to clinic on Monday February 20th, 2023 for a repeat INR.  Patient verbalized understanding of these instructions.

## 2021-10-05 NOTE — Progress Notes (Signed)
° °  CC: Hematuria  HPI:  [name] is a 46 y.o. male with history listed below presenting to the [facility] for hematuria. Please see individualized problem based charting for full HPI.  Past Medical History:  Diagnosis Date   D V T 10/09/2010   Qualifier: Diagnosis of  By: Elsworth Soho MD, Leanna Sato.      DVT (deep venous thrombosis) ([place])    Seasonal allergies     Review of Systems:  Negative aside from that listed in individualized problem based charting.  Physical Exam:  Vitals:   10/05/21 1512  BP: 128/64  Pulse: 88  Temp: 98.2 F (36.8 C)  TempSrc: Oral  SpO2: 96%  Weight: 216 lb 4.8 oz (98.1 kg)  Height: 6' (1.829 m)   Physical Exam Constitutional:      Appearance: Normal appearance.  HENT:     Mouth/Throat:     Mouth: Mucous membranes are moist.     Pharynx: Oropharynx is clear.  Eyes:     Extraocular Movements: Extraocular movements intact.     Conjunctiva/sclera: Conjunctivae normal.     Pupils: Pupils are equal, round, and reactive to light.  Cardiovascular:     Rate and Rhythm: Normal rate and regular rhythm.     Pulses: Normal pulses.     Heart sounds: Normal heart sounds. No murmur heard.   No gallop.  Pulmonary:     Effort: Pulmonary effort is normal.     Breath sounds: Normal breath sounds. No wheezing, rhonchi or rales.  Abdominal:     General: Bowel sounds are normal. There is no distension.     Palpations: Abdomen is soft.     Tenderness: There is no abdominal tenderness.  Musculoskeletal:        General: No swelling. Normal range of motion.  Skin:    General: Skin is warm and dry.  Neurological:     General: No focal deficit present.     Mental Status: He is alert and oriented to person, place, and time.  Psychiatric:        Mood and Affect: Mood normal.        Behavior: Behavior normal.     Assessment & Plan:   See Encounters Tab for problem based charting.  Patient discussed with Dr. Heber Hazleton

## 2021-10-05 NOTE — Assessment & Plan Note (Addendum)
Patient presents with an episode of hematuria that occurred about a week ago.  States that he went to the bathroom and noticed that his urine was dark brown/reddish in color.  States this occurred only once but has had his typical urine color since then (yellow-colored urine).  Denies any hematemesis, hematochezia, melena, nausea, abdominal pain, urinary frequency, burning with urination, pain with urination, fevers, chills. He was seen by Dr. Elie Confer on 09/28/2021, who obtained a urinalysis that showed findings consistent with microscopic hematuria.  Unfortunately, we were unable to obtain a urine culture at that time.  Of note, patient is on long-term anticoagulation with warfarin for 2 prior episodes of unprovoked PE.  Although his INR is supratherapeutic, it is unlikely that warfarin is the cause of current hematuria and thus further work-up is indicated.  He is presenting for follow-up today.  Reports that he has not had any further episodes of dark-colored urine.  However, given episode of gross hematuria and subsequent confirmation of microscopic hematuria on urinalysis, will obtain a complete urinalysis with urine culture today to evaluate for urinary tract infection.  Should urinalysis and or urine culture be positive, will treat with antibiotic therapy and subsequently repeat urinalysis to ensure resolution of hematuria.  Should urinalysis and urine culture be negative, will order a CT urogram to further evaluate kidneys and ureters.  If CT urogram is also negative, consider referral to urology for cystoscopy to determine etiology of hematuria.  Plan: -Follow-up urinalysis and urine culture -Prescribed antibiotics if positive for UTI and repeat urinalysis after appropriate therapy -If negative, order CT urogram for further evaluation of hematuria -Follow-up in 2 weeks  Addendum: Urinalysis    Component Value Date/Time   COLORURINE YELLOW 09/20/2010 1707   APPEARANCEUR Clear 10/05/2021 1524    LABSPEC 1.026 09/20/2010 1707   PHURINE 6.0 09/20/2010 1707   GLUCOSEU Negative 10/05/2021 [place] 09/20/2010 1707   BILIRUBINUR Negative 10/05/2021 1524   KETONESUR NEGATIVE 09/20/2010 1707   PROTEINUR Negative 10/05/2021 [place] 09/20/2010 1707   UROBILINOGEN 1.0 09/20/2010 1707   NITRITE Negative 10/05/2021 1524   NITRITE NEGATIVE 09/20/2010 1707   LEUKOCYTESUR Negative 10/05/2021 1524   Patient's complete UA with no abnormalities noted (no bilirubin, RBCs, nitrities, LE), making UTI unlikely as cause of hematuria. Given episode of gross hematuria and confirmed microscopic hematuria, further evaluation is indicated with a CT urogram. Will placed future order. Attempted to contact patient x2 to inform him of results, but unable to reach him (left voicemail). He is scheduled to be seen in 2 weeks so will discuss results with him at that time if unable to reach him earlier.

## 2021-10-06 LAB — MICROSCOPIC EXAMINATION
Bacteria, UA: NONE SEEN
Casts: NONE SEEN /lpf
Epithelial Cells (non renal): NONE SEEN /hpf (ref 0–10)
WBC, UA: NONE SEEN /hpf (ref 0–5)

## 2021-10-06 LAB — URINALYSIS, COMPLETE
Bilirubin, UA: NEGATIVE
Glucose, UA: NEGATIVE
Ketones, UA: NEGATIVE
Leukocytes,UA: NEGATIVE
Nitrite, UA: NEGATIVE
Protein,UA: NEGATIVE
RBC, UA: NEGATIVE
Specific Gravity, UA: 1.018 (ref 1.005–1.030)
Urobilinogen, Ur: 0.2 mg/dL (ref 0.2–1.0)
pH, UA: 6 (ref 5.0–7.5)

## 2021-10-06 NOTE — Addendum Note (Signed)
Addended by: Merrilyn Puma on: 10/06/2021 10:03 AM   Modules accepted: Orders

## 2021-10-07 LAB — URINE CULTURE

## 2021-10-07 NOTE — Progress Notes (Signed)
Patient called.  Attempted to contact patient again, but unable to reach. Left another voicemail.

## 2021-10-07 NOTE — Progress Notes (Signed)
Internal Medicine Clinic Attending  Case discussed with Dr. Jinwala  At the time of the visit.  We reviewed the resident's history and exam and pertinent patient test results.  I agree with the assessment, diagnosis, and plan of care documented in the resident's note.  

## 2021-10-07 NOTE — Progress Notes (Signed)
Evaluation and management procedures were performed by the Clinical Pharmacy Practitioner under my supervision and collaboration. I have reviewed the Practitioner's note and chart, and I agree with the management and plan as documented above. ° °

## 2021-10-12 ENCOUNTER — Ambulatory Visit (INDEPENDENT_AMBULATORY_CARE_PROVIDER_SITE_OTHER): Payer: BC Managed Care – PPO | Admitting: Pharmacist

## 2021-10-12 DIAGNOSIS — I2699 Other pulmonary embolism without acute cor pulmonale: Secondary | ICD-10-CM

## 2021-10-12 DIAGNOSIS — Z7901 Long term (current) use of anticoagulants: Secondary | ICD-10-CM

## 2021-10-12 LAB — POCT INR: INR: 2.3 (ref 2.0–3.0)

## 2021-10-12 MED ORDER — WARFARIN SODIUM 5 MG PO TABS: ORAL_TABLET | ORAL | 2 refills | Status: DC

## 2021-10-12 NOTE — Progress Notes (Signed)
Anticoagulation Management Richard [name] is a 46 y.o. male who reports to the clinic for monitoring of warfarin treatment.    Indication: PE with infarction, history of; long term current use of oral anticoagulant warfarin to target INR 2.0 - 3.0.  Duration: indefinite Supervising physician: Aldine Contes  Anticoagulation Clinic Visit History: Patient does not report signs/symptoms of bleeding or thromboembolism  Other recent changes: No diet, medications, lifestyle changes cited by the patient at this encounter.  Anticoagulation Episode Summary     Current INR goal:  2.0-3.0  TTR:  54.8 % (9.5 y)  Next INR check:  10/12/2021  INR from last check:  2.3 (10/12/2021)  Weekly max warfarin dose:  45 mg  Target end date:  Indefinite  INR check location:  Anticoagulation Clinic  Preferred lab:    Send INR reminders to:     Indications   Pulmonary embolism and infarction ([place]) [I26.99] D V T (Resolved) [I80.299] Long term (current) use of anticoagulants [Z79.01]        Comments:           No Known Allergies  Current Outpatient Medications:    valACYclovir (VALTREX) 500 MG tablet, Take 1 tablet (500 mg total) by mouth 2 (two) times daily. for 3 days (Patient not taking: Reported on 10/05/2021), Disp: 180 tablet, Rfl: 0   warfarin (COUMADIN) 5 MG tablet, Take one (1) tablet of your 5mg  peach-colored warfarin tablets on Mondays, Wednesdays and Fridays. On Sundays, Tuesdays, Thursdays and Saturdays, take one-and-one-half (1&1/2) of your 5mg  peach-colored warfarin tablets., Disp: 36 tablet, Rfl: 2 Past Medical History:  Diagnosis Date   D V T 10/09/2010   Qualifier: Diagnosis of  By: Elsworth Soho MD, Leanna Sato.      DVT (deep venous thrombosis) ([place])    Seasonal allergies    Social History   Socioeconomic History   Marital status: Single    Spouse name: Not on file   Number of children: Not on file   Years of education: Not on file   Highest education level: Not on file  Occupational  History   Not on file  Tobacco Use   Smoking status: Former    Years: 1.00    Types: Cigarettes, Cigars    Quit date: 04/05/2012    Years since quitting: 9.5   Smokeless tobacco: Never  Substance and Sexual Activity   Alcohol use: No    Alcohol/week: 0.0 standard drinks   Drug use: No   Sexual activity: Not on file  Other Topics Concern   Not on file  Social History Narrative   Not on file   Social Determinants of Health   Financial Resource Strain: Not on file  Food Insecurity: Not on file  Transportation Needs: Not on file  Physical Activity: Not on file  Stress: Not on file  Social Connections: Not on file   Family History  Problem Relation Age of Onset   Other Mother    Other Father    Pulmonary embolism Maternal Uncle     ASSESSMENT Recent Results: The most recent result is correlated with  35 mg per week which saw one dose being held/omitted: Lab Results  Component Value Date   INR 2.3 10/12/2021   INR 4.5 (A) 10/05/2021   INR 3.1 (A) 09/28/2021   PROTIME 20.4 (H) 03/16/2011    Anticoagulation Dosing: Description   Take one (1) tablet of your 5mg  peach-colored warfarin tablets on Mondays, Wednesdays and Fridays. On Sundays, Tuesdays, Thursdays and Saturdays, take  one-and-one-half (1&1/2) of your 5mg  peach-colored warfarin tablets.      INR today: Therapeutic  PLAN Weekly dose was increased by 20% to 45 mg per week  Patient Instructions  Patient instructed to take medications as defined in the Anti-coagulation Track section of this encounter.  Patient instructed to take today's dose.  Patient instructed to take one (1) tablet of your 5mg  peach-colored warfarin tablets on Mondays, Wednesdays and Fridays. On Sundays, Tuesdays, Thursdays and Saturdays, take one-and-one-half (1&1/2) of your 5mg  peach-colored warfarin tablets.  Patient instructed to take Patient verbalized understanding of these instructions.   Patient advised to contact clinic or seek  medical attention if signs/symptoms of bleeding or thromboembolism occur.  Patient verbalized understanding by repeating back information and was advised to contact me if further medication-related questions arise. Patient was also provided an information handout.  Follow-up Return in 4 weeks (on 11/09/2021) for Follow up INR.  [name], PharmD, CPP  15 minutes spent face-to-face with the patient during the encounter. 50% of time spent on education, including signs/sx bleeding and clotting, as well as food and drug interactions with warfarin. 50% of time was spent on fingerprick POC INR sample collection,processing, results determination, and documentation in http://www.[email or web address]/.

## 2021-10-12 NOTE — Patient Instructions (Signed)
Patient instructed to take medications as defined in the Anti-coagulation Track section of this encounter.  Patient instructed to take today's dose.  Patient instructed to take one (1) tablet of your 5mg  peach-colored warfarin tablets on Mondays, Wednesdays and Fridays. On Sundays, Tuesdays, Thursdays and Saturdays, take one-and-one-half (1&1/2) of your 5mg  peach-colored warfarin tablets.  Patient instructed to take Patient verbalized understanding of these instructions.

## 2021-10-14 NOTE — Progress Notes (Signed)
INTERNAL MEDICINE TEACHING ATTENDING ADDENDUM - Toshika Parrow M.D  Duration- indefinite, Indication- recurrent PE, INR- therapeutic. Agree with pharmacy recommendations as outlined in their note.      

## 2021-10-19 ENCOUNTER — Encounter: Payer: Self-pay | Admitting: Internal Medicine

## 2021-10-19 ENCOUNTER — Ambulatory Visit (INDEPENDENT_AMBULATORY_CARE_PROVIDER_SITE_OTHER): Payer: BC Managed Care – PPO | Admitting: Internal Medicine

## 2021-10-19 VITALS — BP 128/80 | HR 98 | Temp 98.7°F | Ht 72.0 in | Wt 213.1 lb

## 2021-10-19 DIAGNOSIS — R319 Hematuria, unspecified: Secondary | ICD-10-CM | POA: Diagnosis not present

## 2021-10-19 DIAGNOSIS — E7800 Pure hypercholesterolemia, unspecified: Secondary | ICD-10-CM

## 2021-10-19 DIAGNOSIS — I10 Essential (primary) hypertension: Secondary | ICD-10-CM

## 2021-10-19 DIAGNOSIS — E663 Overweight: Secondary | ICD-10-CM | POA: Diagnosis not present

## 2021-10-19 DIAGNOSIS — E785 Hyperlipidemia, unspecified: Secondary | ICD-10-CM | POA: Insufficient documentation

## 2021-10-19 NOTE — Progress Notes (Signed)
° °  CC: hematuria  HPI:  [name] is a 46 y.o. PMH noted below, who presents to the [facility] with complaints of hematuria. To see the management of his acute and chronic conditions, please refer to the A&P note under the encounters tab.   Past Medical History:  Diagnosis Date   D V T 10/09/2010   Qualifier: Diagnosis of  By: Richard Soho MD, Richard [name]      DVT (deep venous thrombosis) ([place])    Seasonal allergies    Review of Systems:  negative for blood in urine, change in urine color, pain with urination  Physical Exam: Gen: middle aged mad in NAD HEENT: normocephalic atraumatic CV: RRR, no m/r/g   Resp: CTAB, normal WOB  GI: soft, nontender MSK: moves all extremities without difficulty Skin:warm and dry Neuro:alert answering questions appropriately Psych: normal affect   Assessment & Plan:   See Encounters Tab for problem based charting.  Patient discussed with Dr. Dareen [name]

## 2021-10-19 NOTE — Assessment & Plan Note (Signed)
Patient counseled on dietary interventions and DASH diet information provided

## 2021-10-19 NOTE — Assessment & Plan Note (Signed)
DASH diet information provided. Recheck lipids at 1 year mark.

## 2021-10-19 NOTE — Assessment & Plan Note (Signed)
Patient has stage 1 hypertension. Recommended Dietary modifications. - DASH diet info provided

## 2021-10-19 NOTE — Assessment & Plan Note (Signed)
CT hematuria ordered for March 1. (see note from 2/14). No further episodes of hematuria have been recorded. - consider urology referral depending on results - f/u CT urogram - instructions to call for appointment preparation given to patient

## 2021-10-19 NOTE — Patient Instructions (Signed)
Richard [name]  It was a pleasure seeing you in the clinic today.   We talked about your blood pressure, your cholesterol, and the blood in your urine.  Healthy diet- to control your blood and cholesterol pressure I recommend you follow the DASH diet which I have attached here.  2. Blood in your Urine- you are scheduled for an appointment on March 1st at [facility]. However you need to call them to get instructions for your appointment. [phone]   Please call our clinic at [phone] if you have any questions or concerns. The best time to call is Monday-Friday from 9am-4pm, but there is someone available 24/7 at the same number. If you need medication refills, please notify your pharmacy one week in advance and they will send [place] a request.   Thank you for letting us take part in your care. We look forward to seeing you next time!

## 2021-10-20 NOTE — Progress Notes (Signed)
Internal Medicine Clinic Attending ° °Case discussed with Dr. DeMaio  At the time of the visit.  We reviewed the resident’s history and exam and pertinent patient test results.  I agree with the assessment, diagnosis, and plan of care documented in the resident’s note. ° ° °

## 2021-10-21 ENCOUNTER — Other Ambulatory Visit: Payer: Self-pay

## 2021-10-21 ENCOUNTER — Ambulatory Visit (HOSPITAL_COMMUNITY)
Admission: RE | Admit: 2021-10-21 | Discharge: 2021-10-21 | Disposition: A | Discharge status: Home / Self Care | Payer: BC Managed Care – PPO | Source: Ambulatory Visit | Attending: Internal Medicine | Admitting: Internal Medicine

## 2021-10-21 DIAGNOSIS — Z7901 Long term (current) use of anticoagulants: Secondary | ICD-10-CM | POA: Insufficient documentation

## 2021-10-21 DIAGNOSIS — R310 Gross hematuria: Secondary | ICD-10-CM | POA: Diagnosis not present

## 2021-10-21 DIAGNOSIS — R319 Hematuria, unspecified: Secondary | ICD-10-CM | POA: Diagnosis not present

## 2021-10-21 MED ORDER — SODIUM CHLORIDE (PF) 0.9 % IJ SOLN
INTRAMUSCULAR | Status: AC
  Filled 2021-10-21: qty 50

## 2021-10-21 MED ORDER — IOHEXOL 300 MG/ML  SOLN
150.0000 mL | Freq: Once | INTRAMUSCULAR | Status: AC | PRN
  Administered 2021-10-21: 150 mL via INTRAVENOUS

## 2021-10-23 DIAGNOSIS — R319 Hematuria, unspecified: Secondary | ICD-10-CM | POA: Diagnosis not present

## 2021-11-09 ENCOUNTER — Ambulatory Visit (INDEPENDENT_AMBULATORY_CARE_PROVIDER_SITE_OTHER): Payer: BC Managed Care – PPO | Admitting: Pharmacist

## 2021-11-09 DIAGNOSIS — Z7901 Long term (current) use of anticoagulants: Secondary | ICD-10-CM

## 2021-11-09 DIAGNOSIS — I2699 Other pulmonary embolism without acute cor pulmonale: Secondary | ICD-10-CM | POA: Diagnosis not present

## 2021-11-09 LAB — POCT INR: INR: 2.3 (ref 2.0–3.0)

## 2021-11-09 MED ORDER — WARFARIN SODIUM 5 MG PO TABS: ORAL_TABLET | ORAL | 2 refills | Status: DC

## 2021-11-09 NOTE — Patient Instructions (Signed)
Patient instructed to take medications as defined in the Anti-coagulation Track section of this encounter.  ?Patient instructed to take today's dose. States he has already taken today's dose.  ?Patient instructed to take one (1) tablet of your 5mg  peach-colored warfarin tablets on Mondays, Wednesdays and Fridays. On Sundays, Tuesdays, Thursdays and Saturdays, take one-and-one-half (1&1/2) of your 5mg  peach-colored warfarin tablets.  ?Patient verbalized understanding of these instructions.   ?

## 2021-11-09 NOTE — Progress Notes (Signed)
Anticoagulation Management ?Richard [name] is a 46 y.o. male who reports to the clinic for monitoring of warfarin treatment.   ? ?Indication: PE with infarction; long term current use of oral anticoagulant, warfarin--to target INR range of 2.0 - 3.0.  ?Duration: indefinite ?Supervising physician: Debe Coder ? ?Anticoagulation Clinic Visit History: ?Patient does not report signs/symptoms of bleeding or thromboembolism  ?Other recent changes: No diet, medications, lifestyle changes endorsed by the patient at today's visit.  ?Anticoagulation Episode Summary   ? ? Current INR goal:  2.0-3.0  ?TTR:  55.1 % (9.5 y)  ?Next INR check:  12/07/2021  ?INR from last check:    ?Weekly max warfarin dose:  45 mg  ?Target end date:  Indefinite  ?INR check location:  Anticoagulation Clinic  ?Preferred lab:    ?Send INR reminders to:    ? Indications   ?Pulmonary embolism and infarction (HCC) [I26.99] ?D V T (Resolved) [I80.299] ?Long term (current) use of anticoagulants [Z79.01] ? ?  ?  ? ? Comments:    ?  ? ?  ? ? ?No Known Allergies ? ?Current Outpatient Medications:  ?  warfarin (COUMADIN) 5 MG tablet, Take one (1) tablet of your 5mg  peach-colored warfarin tablets on Mondays, Wednesdays and Fridays. On Sundays, Tuesdays, Thursdays and Saturdays, take one-and-one-half (1&1/2) of your 5mg  peach-colored warfarin tablets., Disp: 36 tablet, Rfl: 2 ?  valACYclovir (VALTREX) 500 MG tablet, Take 1 tablet (500 mg total) by mouth 2 (two) times daily. for 3 days (Patient not taking: Reported on 10/05/2021), Disp: 180 tablet, Rfl: 0 ?Past Medical History:  ?Diagnosis Date  ? D V T 10/09/2010  ? Qualifier: Diagnosis of  By: 10/07/2021 MD, 10/11/2010.     ? DVT (deep venous thrombosis) (HCC)   ? Seasonal allergies   ? ?Social History  ? ?Socioeconomic History  ? Marital status: Single  ?  Spouse name: Not on file  ? Number of children: Not on file  ? Years of education: Not on file  ? Highest education level: Not on file  ?Occupational History  ? Not on  file  ?Tobacco Use  ? Smoking status: Former  ?  Years: 1.00  ?  Types: Cigarettes, Cigars  ?  Quit date: 04/05/2012  ?  Years since quitting: 9.6  ? Smokeless tobacco: Never  ?Substance and Sexual Activity  ? Alcohol use: No  ?  Alcohol/week: 0.0 standard drinks  ? Drug use: No  ? Sexual activity: Not on file  ?Other Topics Concern  ? Not on file  ?Social History Narrative  ? Not on file  ? ?Social Determinants of Health  ? ?Financial Resource Strain: Not on file  ?Food Insecurity: Not on file  ?Transportation Needs: Not on file  ?Physical Activity: Not on file  ?Stress: Not on file  ?Social Connections: Not on file  ? ?Family History  ?Problem Relation Age of Onset  ? Other Mother   ? Other Father   ? Pulmonary embolism Maternal Uncle   ? ? ?ASSESSMENT ?Recent Results: ?The most recent result is correlated with 45 mg per week: ?Lab Results  ?Component Value Date  ? INR 2.3 11/09/2021  ? INR 2.3 10/12/2021  ? INR 4.5 (A) 10/05/2021  ? PROTIME 20.4 (H) 03/16/2011  ? ? ?Anticoagulation Dosing: ?Description   ?Take one (1) tablet of your 5mg  peach-colored warfarin tablets on Mondays, Wednesdays and Fridays. On Sundays, Tuesdays, Thursdays and Saturdays, take one-and-one-half (1&1/2) of your 5mg  peach-colored warfarin tablets.  ?  ?  ?  INR today: Therapeutic ? ?PLAN ?Weekly dose was unchanged. Continue taking 5mg  warfarin on M/W/F; 1 and 1/2 x 5mg  (7.5mg ) on Sundays, Tuesdays, Thursdays and Saturdays.  ? ?Patient Instructions  ?Patient instructed to take medications as defined in the Anti-coagulation Track section of this encounter.  ?Patient instructed to take today's dose. States he has already taken today's dose.  ?Patient instructed to take one (1) tablet of your 5mg  peach-colored warfarin tablets on Mondays, Wednesdays and Fridays. On Sundays, Tuesdays, Thursdays and Saturdays, take one-and-one-half (1&1/2) of your 5mg  peach-colored warfarin tablets.  ?Patient verbalized understanding of these instructions.    ?Patient advised to contact clinic or seek medical attention if signs/symptoms of bleeding or thromboembolism occur. ? ?Patient verbalized understanding by repeating back information and was advised to contact me if further medication-related questions arise. Patient was also provided an information handout. ? ?Follow-up ?Return in 4 weeks (on 12/07/2021) for Follow up INR. ? ?01-23-1979 ? ?15 minutes spent face-to-face with the patient during the encounter. 50% of time spent on education, including signs/sx bleeding and clotting, as well as food and drug interactions with warfarin. 50% of time was spent on fingerprick POC INR sample collection,processing, results determination, and documentation in 11-18-1990.  ?

## 2021-11-16 NOTE — Progress Notes (Signed)
Evaluation and management procedures were performed by the Clinical Pharmacy Practitioner under my supervision and collaboration. I have reviewed the Practitioner's note and chart, and I agree with the management and plan as documented above. ° °

## 2021-12-07 ENCOUNTER — Ambulatory Visit (INDEPENDENT_AMBULATORY_CARE_PROVIDER_SITE_OTHER): Payer: BC Managed Care – PPO | Admitting: Pharmacist

## 2021-12-07 DIAGNOSIS — I2699 Other pulmonary embolism without acute cor pulmonale: Secondary | ICD-10-CM | POA: Diagnosis not present

## 2021-12-07 DIAGNOSIS — Z7901 Long term (current) use of anticoagulants: Secondary | ICD-10-CM | POA: Diagnosis not present

## 2021-12-07 LAB — POCT INR: INR: 2.2 (ref 2.0–3.0)

## 2021-12-07 MED ORDER — WARFARIN SODIUM 5 MG PO TABS: ORAL_TABLET | ORAL | 2 refills | Status: DC

## 2021-12-07 NOTE — Patient Instructions (Signed)
Patient instructed to take medications as defined in the Anti-coagulation Track section of this encounter.  ?Patient instructed to take today's dose.  ?Patient instructed to take one (1) tablet of your 5mg  peach-colored warfarin tablets on Mondays, Wednesdays and Fridays. On Sundays, Tuesdays, Thursdays and Saturdays, take one-and-one-half (1&1/2) of your 5mg  peach-colored warfarin tablets.  ?Patient verbalized understanding of these instructions.   ?

## 2021-12-07 NOTE — Progress Notes (Signed)
Anticoagulation Management ?Richard [name] is a 46 y.o. male who reports to the clinic for monitoring of warfarin treatment.   ? ?Indication: PE , history of, with infarction; long term current use of oral anticoagulant-warfarin, to maintain INR 2.0 - 3.0.  ?Duration: indefinite ?Supervising physician:  Reymundo Poll, MD ? ?Anticoagulation Clinic Visit History: ?Patient does not report signs/symptoms of bleeding or thromboembolism  ?Other recent changes: No diet, medications, lifestyle ?Anticoagulation Episode Summary   ? ? Current INR goal:  2.0-3.0  ?TTR:  55.5 % (9.6 y)  ?Next INR check:  01/04/2022  ?INR from last check:  2.2 (12/07/2021)  ?Weekly max warfarin dose:  45 mg  ?Target end date:  Indefinite  ?INR check location:  Anticoagulation Clinic  ?Preferred lab:    ?Send INR reminders to:    ? Indications   ?Pulmonary embolism and infarction (HCC) [I26.99] ?D V T (Resolved) [I80.299] ?Long term (current) use of anticoagulants [Z79.01] ? ?  ?  ? ? Comments:    ?  ? ?  ? ? ?No Known Allergies ? ?Current Outpatient Medications:  ?  warfarin (COUMADIN) 5 MG tablet, Take one (1) tablet of your 5mg  peach-colored warfarin tablets on Mondays, Wednesdays and Fridays. On Sundays, Tuesdays, Thursdays and Saturdays, take one-and-one-half (1&1/2) of your 5mg  peach-colored warfarin tablets., Disp: 36 tablet, Rfl: 2 ?Past Medical History:  ?Diagnosis Date  ? D V T 10/09/2010  ? Qualifier: Diagnosis of  By: MD, 10/11/2010.     ? DVT (deep venous thrombosis) (H[facility])   ? Seasonal allergies   ? ?Social History  ? ?Socioeconomic History  ? Marital status: Single  ?  Spouse name: Not on file  ? Number of children: Not on file  ? Years of education: Not on file  ? Highest education level: Not on file  ?Occupational History  ? Not on file  ?Tobacco Use  ? Smoking status: Former  ?  Years: 1.00  ?  Types: Cigarettes, Cigars  ?  Quit date: 04/05/2012  ?  Years since quitting: 9.6  ? Smokeless tobacco: Never  ?Substance and Sexual  Activity  ? Alcohol use: No  ?  Alcohol/week: 0.0 standard drinks  ? Drug use: No  ? Sexual activity: Not on file  ?Other Topics Concern  ? Not on file  ?Social History Narrative  ? Not on file  ? ?Social Determinants of Health  ? ?Financial Resource Strain: Not on file  ?Food Insecurity: Not on file  ?Transportation Needs: Not on file  ?Physical Activity: Not on file  ?Stress: Not on file  ?Social Connections: Not on file  ? ?Family History  ?Problem Relation Age of Onset  ? Other Mother   ? Other Father   ? Pulmonary embolism Maternal Uncle   ? ? ?ASSESSMENT ?Recent Results: ?The most recent result is correlated with 45 mg per week: ?Lab Results  ?Component Value Date  ? INR 2.2 12/07/2021  ? INR 2.3 11/09/2021  ? INR 2.3 10/12/2021  ? PROTIME 20.4 (H) 03/16/2011  ? ? ?Anticoagulation Dosing: ?Description   ?Take one (1) tablet of your 5mg  peach-colored warfarin tablets on Mondays, Wednesdays and Fridays. On Sundays, Tuesdays, Thursdays and Saturdays, take one-and-one-half (1&1/2) of your 5mg  peach-colored warfarin tablets.  ?  ?  ?INR today: Therapeutic ? ?PLAN ?Weekly dose was unchanged, continue taking 5mg  on Mondays, Wednesdays and Fridays; All other days--take 1 & 1/2 x 5mg  (7.5mg  dose). ? ?Patient Instructions  ?Patient instructed to take medications  as defined in the Anti-coagulation Track section of this encounter.  ?Patient instructed to take today's dose.  ?Patient instructed to take one (1) tablet of your 5mg  peach-colored warfarin tablets on Mondays, Wednesdays and Fridays. On Sundays, Tuesdays, Thursdays and Saturdays, take one-and-one-half (1&1/2) of your 5mg  peach-colored warfarin tablets.  ?Patient verbalized understanding of these instructions.   ?Patient advised to contact clinic or seek medical attention if signs/symptoms of bleeding or thromboembolism occur. ? ?Patient verbalized understanding by repeating back information and was advised to contact me if further medication-related questions  arise. Patient was also provided an information handout. ? ?Follow-up ?Return in 4 weeks (on 01/04/2022) for Follow up INR. ? ? , PharmD, CPP ? ?15 minutes spent face-to-face with the patient during the encounter. 50% of time spent on education, including signs/sx bleeding and clotting, as well as food and drug interactions with warfarin. 50% of time was spent on fingerprick POC INR sample collection,processing, results determination, and documentation in 01/06/2022.  ?

## 2021-12-11 NOTE — Progress Notes (Signed)
INTERNAL MEDICINE TEACHING ATTENDING ADDENDUM   I agree with pharmacy recommendations as outlined in their note.   Clay Solum, MD  

## 2021-12-29 DIAGNOSIS — Z7901 Long term (current) use of anticoagulants: Secondary | ICD-10-CM | POA: Diagnosis not present

## 2021-12-29 DIAGNOSIS — R079 Chest pain, unspecified: Secondary | ICD-10-CM | POA: Diagnosis not present

## 2021-12-29 DIAGNOSIS — R0789 Other chest pain: Secondary | ICD-10-CM | POA: Diagnosis not present

## 2022-01-04 ENCOUNTER — Ambulatory Visit (INDEPENDENT_AMBULATORY_CARE_PROVIDER_SITE_OTHER): Payer: BC Managed Care – PPO | Admitting: Pharmacist

## 2022-01-04 DIAGNOSIS — I2699 Other pulmonary embolism without acute cor pulmonale: Secondary | ICD-10-CM

## 2022-01-04 DIAGNOSIS — Z7901 Long term (current) use of anticoagulants: Secondary | ICD-10-CM

## 2022-01-04 LAB — POCT INR: INR: 2.1 (ref 2.0–3.0)

## 2022-01-04 NOTE — Patient Instructions (Signed)
Patient instructed to take medications as defined in the Anti-coagulation Track section of this encounter.  ?Patient instructed to take today's dose.  ?Patient instructed to take one-and-one-half (1&1/2) of your 5mg  peach-colored warfarin tablets all days of the week--EXCEPT on FRIDAYS, take ONLY ONE (1) tablet on Fridays.  ?Patient verbalized understanding of these instructions.   ?

## 2022-01-04 NOTE — Progress Notes (Signed)
Anticoagulation Management ?Richard [name] is a 46 y.o. male who reports to the clinic for monitoring of warfarin treatment.   ? ?Indication: PE with infarction; long term current use of oral anticoagulant, warfarin, with target goal INR 2.0 - 3.0.  ?Duration: indefinite ?Supervising physician: Earl Lagos ? ?Anticoagulation Clinic Visit History: ?Patient does not report signs/symptoms of bleeding or thromboembolism  ?Other recent changes: No diet, medications, lifestyle changes.  ?Anticoagulation Episode Summary   ? ? Current INR goal:  2.0-3.0  ?TTR:  55.8 % (9.7 y)  ?Next INR check:  02/01/2022  ?INR from last check:  2.1 (01/04/2022)  ?Weekly max warfarin dose:  45 mg  ?Target end date:  Indefinite  ?INR check location:  Anticoagulation Clinic  ?Preferred lab:    ?Send INR reminders to:    ? Indications   ?Pulmonary embolism and infarction (HCC) [I26.99] ?D V T (Resolved) [I80.299] ?Long term (current) use of anticoagulants [Z79.01] ? ?  ?  ? ? Comments:    ?  ? ?  ? ? ?No Known Allergies ? ?Current Outpatient Medications:  ?  warfarin (COUMADIN) 5 MG tablet, Take one (1) tablet of your 5mg  peach-colored warfarin tablets on Mondays, Wednesdays and Fridays. On Sundays, Tuesdays, Thursdays and Saturdays, take one-and-one-half (1&1/2) of your 5mg  peach-colored warfarin tablets., Disp: 36 tablet, Rfl: 2 ?Past Medical History:  ?Diagnosis Date  ? D V T 10/09/2010  ? Qualifier: Diagnosis of  By: MD, 10/11/2010.     ? DVT (deep venous thrombosis) (H[facility])   ? Seasonal allergies   ? ?Social History  ? ?Socioeconomic History  ? Marital status: Single  ?  Spouse name: Not on file  ? Number of children: Not on file  ? Years of education: Not on file  ? Highest education level: Not on file  ?Occupational History  ? Not on file  ?Tobacco Use  ? Smoking status: Former  ?  Years: 1.00  ?  Types: Cigarettes, Cigars  ?  Quit date: 04/05/2012  ?  Years since quitting: 9.7  ? Smokeless tobacco: Never  ?Substance and Sexual  Activity  ? Alcohol use: No  ?  Alcohol/week: 0.0 standard drinks  ? Drug use: No  ? Sexual activity: Not on file  ?Other Topics Concern  ? Not on file  ?Social History Narrative  ? Not on file  ? ?Social Determinants of Health  ? ?Financial Resource Strain: Not on file  ?Food Insecurity: Not on file  ?Transportation Needs: Not on file  ?Physical Activity: Not on file  ?Stress: Not on file  ?Social Connections: Not on file  ? ?Family History  ?Problem Relation Age of Onset  ? Other Mother   ? Other Father   ? Pulmonary embolism Maternal Uncle   ? ? ?ASSESSMENT ?Recent Results: ?The most recent result is correlated with 45 mg per week: ?Lab Results  ?Component Value Date  ? INR 2.1 01/04/2022  ? INR 2.2 12/07/2021  ? INR 2.3 11/09/2021  ? PROTIME 20.4 (H) 03/16/2011  ? ? ?Anticoagulation Dosing: ?Description   ?Take one-and-one-half (1&1/2) of your 5mg  peach-colored warfarin tablets all days of the week--EXCEPT on FRIDAYS, take ONLY ONE (1) tablet on Fridays.  ?  ?  ?INR today: Therapeutic ? ?PLAN ?Weekly dose was increased by 11% to 50 mg per week ? ?Patient Instructions  ?Patient instructed to take medications as defined in the Anti-coagulation Track section of this encounter.  ?Patient instructed to take today's dose.  ?Patient  instructed to take one-and-one-half (1&1/2) of your 5mg  peach-colored warfarin tablets all days of the week--EXCEPT on FRIDAYS, take ONLY ONE (1) tablet on Fridays.  ?Patient verbalized understanding of these instructions.   ?Patient advised to contact clinic or seek medical attention if signs/symptoms of bleeding or thromboembolism occur. ? ?Patient verbalized understanding by repeating back information and was advised to contact me if further medication-related questions arise. Patient was also provided an information handout. ? ?Follow-up ?Return in 4 weeks (on 02/01/2022) for Follow up INR. ? ?04/03/2022, PharmD, CPP ? ?15 minutes spent face-to-face with the patient during the  encounter. 50% of time spent on education, including signs/sx bleeding and clotting, as well as food and drug interactions with warfarin. 50% of time was spent on fingerprick POC INR sample collection,processing, results determination, and documentation in [name].  ?

## 2022-01-05 NOTE — Progress Notes (Signed)
INTERNAL MEDICINE TEACHING ATTENDING ADDENDUM - Liannah Yarbough M.D  Duration- indefinite, Indication- recurrent PE, INR- therapeutic. Agree with pharmacy recommendations as outlined in their note.      

## 2022-02-01 ENCOUNTER — Ambulatory Visit (INDEPENDENT_AMBULATORY_CARE_PROVIDER_SITE_OTHER): Payer: BC Managed Care – PPO | Admitting: Pharmacist

## 2022-02-01 DIAGNOSIS — I2699 Other pulmonary embolism without acute cor pulmonale: Secondary | ICD-10-CM | POA: Diagnosis not present

## 2022-02-01 DIAGNOSIS — Z7901 Long term (current) use of anticoagulants: Secondary | ICD-10-CM | POA: Diagnosis not present

## 2022-02-01 LAB — POCT INR: INR: 2.1 (ref 2.0–3.0)

## 2022-02-01 MED ORDER — WARFARIN SODIUM 5 MG PO TABS: 7.5000 mg | ORAL_TABLET | Freq: Every day | ORAL | 2 refills | Status: DC

## 2022-02-01 NOTE — Addendum Note (Signed)
Addended by: Gust Rung on: 02/01/2022 02:33 PM   Modules accepted: Orders

## 2022-02-01 NOTE — Patient Instructions (Signed)
Patient instructed to take medications as defined in the Anti-coagulation Track section of this encounter.  Patient instructed to take today's dose.  Patient instructed to take one-and-one-half (1 & 1/2) of your 5mg  strength, peach-colored warfarin tablets daily by mouth at 4PM.  Patient verbalized understanding of these instructions.

## 2022-02-01 NOTE — Progress Notes (Signed)
Anticoagulation Management Richard [name] is a 46 y.o. male who reports to the clinic for monitoring of warfarin treatment.    Indication: PE with history of infarction; long term current use of warfarin, target INR 2.0 - 3.0.  fDuration: indefinite Supervising physician: Earl Lagos  Anticoagulation Clinic Visit History: Patient does not report signs/symptoms of bleeding or thromboembolism  Other recent changes: No diet, medications, lifestyle changes.  Anticoagulation Episode Summary     Current INR goal:  2.0-3.0  TTR:  56.2 % (9.8 y)  Next INR check:  03/01/2022  INR from last check:  2.1 (02/01/2022)  Weekly max warfarin dose:  45 mg  Target end date:  Indefinite  INR check location:  Anticoagulation Clinic  Preferred lab:    Send INR reminders to:     Indications   Pulmonary embolism and infarction (HCC) [I26.99] D V T (Resolved) [I80.299] Long term (current) use of anticoagulants [Z79.01]        Comments:           No Known Allergies  Current Outpatient Medications:    warfarin (COUMADIN) 5 MG tablet, Take 1.5 tablets (7.5 mg total) by mouth daily at 4 PM., Disp: 48 tablet, Rfl: 2 Past Medical History:  Diagnosis Date   D V T 10/09/2010   Qualifier: Diagnosis of  By: [name] MD, [place]er Locket      DVT (deep venous thrombosis) (H[facility])    Seasonal allergies    Social History   Socioeconomic History   Marital status: Single    Spouse name: Not on file   Number of children: Not on file   Years of education: Not on file   Highest education level: Not on file  Occupational History   Not on file  Tobacco Use   Smoking status: Former    Years: 1.00    Types: Cigarettes, Cigars    Quit date: 04/05/2012    Years since quitting: 9.8   Smokeless tobacco: Never  Substance and Sexual Activity   Alcohol use: No    Alcohol/week: 0.0 standard drinks of alcohol   Drug use: No   Sexual activity: Not on file  Other Topics Concern   Not on file  Social History Narrative    Not on file   Social Determinants of Health   Financial Resource Strain: Not on file  Food Insecurity: Not on file  Transportation Needs: Not on file  Physical Activity: Not on file  Stress: Not on file  Social Connections: Not on file   Family History  Problem Relation Age of Onset   Other Mother    Other Father    Pulmonary embolism Maternal Uncle     ASSESSMENT Recent Results: The most recent result is correlated with 50 mg per week: Lab Results  Component Value Date   INR 2.1 02/01/2022   INR 2.1 01/04/2022   INR 2.2 12/07/2021   PROTIME 20.4 (H) 03/16/2011    Anticoagulation Dosing: Description   Take one-and-one-half (1&1/2) of your 5mg  peach-colored warfarin tablets all days of the week.      INR today: Therapeutic  PLAN Weekly dose was increased by 5% to 52.5 mg per week  Patient Instructions  Patient instructed to take medications as defined in the Anti-coagulation Track section of this encounter.  Patient instructed to take today's dose.  Patient instructed to take one-and-one-half (1 & 1/2) of your 5mg  strength, peach-colored warfarin tablets daily by mouth at 4PM.  Patient verbalized understanding of these instructions.  Patient advised to contact clinic or seek medical attention if signs/symptoms of bleeding or thromboembolism occur.  Patient verbalized understanding by repeating back information and was advised to contact me if further medication-related questions arise. Patient was also provided an information handout.  Follow-up Return in about 4 weeks (around 03/01/2022) for Follow up INR.  [name], PharmD, CPP  15 minutes spent face-to-face with the patient during the encounter. 50% of time spent on education, including signs/sx bleeding and clotting, as well as food and drug interactions with warfarin. 50% of time was spent on fingerprick POC INR sample collection,processing, results determination, and documentation in  [email or web address].

## 2022-02-03 NOTE — Progress Notes (Signed)
INTERNAL MEDICINE TEACHING ATTENDING ADDENDUM - Breyon Blass M.D  Duration- indefinite, Indication- recurrent VTE, INR- therapeutic. Agree with pharmacy recommendations as outlined in their note.     

## 2022-03-01 ENCOUNTER — Ambulatory Visit (INDEPENDENT_AMBULATORY_CARE_PROVIDER_SITE_OTHER): Payer: BC Managed Care – PPO | Admitting: Pharmacist

## 2022-03-01 ENCOUNTER — Other Ambulatory Visit: Payer: Self-pay | Admitting: Pharmacist

## 2022-03-01 DIAGNOSIS — I2699 Other pulmonary embolism without acute cor pulmonale: Secondary | ICD-10-CM | POA: Diagnosis not present

## 2022-03-01 DIAGNOSIS — Z7901 Long term (current) use of anticoagulants: Secondary | ICD-10-CM

## 2022-03-01 LAB — POCT INR: INR: 2.2 (ref 2.0–3.0)

## 2022-03-01 MED ORDER — WARFARIN SODIUM 5 MG PO TABS: 7.5000 mg | ORAL_TABLET | Freq: Every day | ORAL | 2 refills | Status: DC

## 2022-03-01 NOTE — Telephone Encounter (Signed)
Requests refill authorization on his warfarin. Will send electronically.

## 2022-03-01 NOTE — Progress Notes (Signed)
Anticoagulation Management Richard [name] is a 46 y.o. male who reports to the clinic for monitoring of warfarin treatment.    Indication: PE with infarction, history of; long term current use of oral anticoagulant, warfarin. INR target 2.0 - 3.0.  Duration: indefinite Supervising physician: Earl Lagos  Anticoagulation Clinic Visit History: Patient does not report signs/symptoms of bleeding or thromboembolism  Other recent changes: No diet, medications, lifestyle changes.  Anticoagulation Episode Summary     Current INR goal:  2.0-3.0  TTR:  56.6 % (9.8 y)  Next INR check:  03/29/2022  INR from last check:  2.2 (03/01/2022)  Weekly max warfarin dose:  45 mg  Target end date:  Indefinite  INR check location:  Anticoagulation Clinic  Preferred lab:    Send INR reminders to:     Indications   Pulmonary embolism and infarction (HCC) [I26.99] D V T (Resolved) [I80.299] Long term (current) use of anticoagulants [Z79.01]        Comments:           No Known Allergies  Current Outpatient Medications:    warfarin (COUMADIN) 5 MG tablet, Take 1.5 tablets (7.5 mg total) by mouth daily at 4 PM., Disp: 48 tablet, Rfl: 2 Past Medical History:  Diagnosis Date   D V T 10/09/2010   Qualifier: Diagnosis of  By: [name] MD, Comer Locket      DVT (deep venous thrombosis) (HCC)    Seasonal allergies    Social History   Socioeconomic History   Marital status: Single    Spouse name: Not on file   Number of children: Not on file   Years of education: Not on file   Highest education level: Not on file  Occupational History   Not on file  Tobacco Use   Smoking status: Former    Years: 1.00    Types: Cigarettes, Cigars    Quit date: 04/05/2012    Years since quitting: 9.9   Smokeless tobacco: Never  Substance and Sexual Activity   Alcohol use: No    Alcohol/week: 0.0 standard drinks of alcohol   Drug use: No   Sexual activity: Not on file  Other Topics Concern   Not on file   Social History Narrative   Not on file   Social Determinants of Health   Financial Resource Strain: Not on file  Food Insecurity: Not on file  Transportation Needs: Not on file  Physical Activity: Not on file  Stress: Not on file  Social Connections: Not on file   Family History  Problem Relation Age of Onset   Other Mother    Other Father    Pulmonary embolism Maternal Uncle     ASSESSMENT Recent Results: The most recent result is correlated with 52.5 mg per week: Lab Results  Component Value Date   INR 2.2 03/01/2022   INR 2.1 02/01/2022   INR 2.1 01/04/2022   PROTIME 20.4 (H) 03/16/2011    Anticoagulation Dosing: Description   Take one-and-one-half (1&1/2) of your 5mg  peach-colored warfarin tablets all days of the week.      INR today: Therapeutic  PLAN Weekly dose was unchanged.   Patient Instructions  Patient instructed to take medications as defined in the Anti-coagulation Track section of this encounter.  Patient instructed to take today's dose.  Patient instructed to take one-and-one-half (1&1/2) of your 5mg  peach-colored warfarin tablets all days of the week.  Patient verbalized understanding of these instructions.   Patient advised to contact clinic or  seek medical attention if signs/symptoms of bleeding or thromboembolism occur.  Patient verbalized understanding by repeating back information and was advised to contact me if further medication-related questions arise. Patient was also provided an information handout.  Follow-up Return in 4 weeks (on 03/29/2022) for Follow up INR.  [name], PharmD, CPP  15 minutes spent face-to-face with the patient during the encounter. 50% of time spent on education, including signs/sx bleeding and clotting, as well as food and drug interactions with warfarin. 50% of time was spent on fingerprick POC INR sample collection,processing, results determination, and documentation in [email or web address].

## 2022-03-01 NOTE — Patient Instructions (Signed)
Patient instructed to take medications as defined in the Anti-coagulation Track section of this encounter.  Patient instructed to take today's dose.  Patient instructed to take one-and-one-half (1&1/2) of your 5mg  peach-colored warfarin tablets all days of the week.  Patient verbalized understanding of these instructions.

## 2022-03-03 NOTE — Progress Notes (Signed)
INTERNAL MEDICINE TEACHING ATTENDING ADDENDUM - Tanvi Gatling M.D  Duration- indefinite, Indication- recurrent PE, INR- therapeutic. Agree with pharmacy recommendations as outlined in their note.      

## 2022-03-29 ENCOUNTER — Ambulatory Visit (INDEPENDENT_AMBULATORY_CARE_PROVIDER_SITE_OTHER): Payer: BC Managed Care – PPO | Admitting: Pharmacist

## 2022-03-29 DIAGNOSIS — Z7901 Long term (current) use of anticoagulants: Secondary | ICD-10-CM | POA: Diagnosis not present

## 2022-03-29 DIAGNOSIS — I2699 Other pulmonary embolism without acute cor pulmonale: Secondary | ICD-10-CM

## 2022-03-29 LAB — POCT INR: INR: 2.7 (ref 2.0–3.0)

## 2022-03-29 MED ORDER — WARFARIN SODIUM 5 MG PO TABS: 7.5000 mg | ORAL_TABLET | Freq: Every day | ORAL | 2 refills | Status: DC

## 2022-03-29 NOTE — Progress Notes (Signed)
Anticoagulation Management Richard [name] is a 46 y.o. male who reports to the clinic for monitoring of warfarin treatment.    Indication: PE , history of with infarction; long term current use of oral anticoagulant, warfarin. Target INR 2.0 - 3.0. Duration: indefinite Supervising physician: [name]  Anticoagulation Clinic Visit History: Patient does not report signs/symptoms of bleeding or thromboembolism  Other recent changes: No diet, medications, lifestyle changes.  Anticoagulation Episode Summary     Current INR goal:  2.0-3.0  TTR:  56.9 % (9.9 y)  Next INR check:  05/03/2022  INR from last check:  2.7 (03/29/2022)  Weekly max warfarin dose:  45 mg  Target end date:  Indefinite  INR check location:  Anticoagulation Clinic  Preferred lab:    Send INR reminders to:     Indications   Pulmonary embolism and infarction (HCC) [I26.99] D V T (Resolved) [I80.299] Long term (current) use of anticoagulants [Z79.01]        Comments:           No Known Allergies  Current Outpatient Medications:    warfarin (COUMADIN) 5 MG tablet, Take 1.5 tablets (7.5 mg total) by mouth daily at 4 PM., Disp: 44 tablet, Rfl: 2 Past Medical History:  Diagnosis Date   D V T 10/09/2010   Qualifier: Diagnosis of  By: [name] MD, Comer Locket      DVT (deep venous thrombosis) (HCC)    Seasonal allergies    Social History   Socioeconomic History   Marital status: Single    Spouse name: Not on file   Number of children: Not on file   Years of education: Not on file   Highest education level: Not on file  Occupational History   Not on file  Tobacco Use   Smoking status: Former    Years: 1.00    Types: Cigarettes, Cigars    Quit date: 04/05/2012    Years since quitting: 9.9   Smokeless tobacco: Never  Substance and Sexual Activity   Alcohol use: No    Alcohol/week: 0.0 standard drinks of alcohol   Drug use: No   Sexual activity: Not on file  Other Topics Concern   Not on file  Social  History Narrative   Not on file   Social Determinants of Health   Financial Resource Strain: Not on file  Food Insecurity: Not on file  Transportation Needs: Not on file  Physical Activity: Not on file  Stress: Not on file  Social Connections: Not on file   Family History  Problem Relation Age of Onset   Other Mother    Other Father    Pulmonary embolism Maternal Uncle     ASSESSMENT Recent Results: The most recent result is correlated with 52.5 mg per week: Lab Results  Component Value Date   INR 2.7 03/29/2022   INR 2.2 03/01/2022   INR 2.1 02/01/2022   PROTIME 20.4 (H) 03/16/2011    Anticoagulation Dosing: Description   Take one-and-one-half (1&1/2) of your 5mg  peach-colored warfarin tablets all days of the week.      INR today: Therapeutic  PLAN Weekly dose was unchanged.   Patient Instructions  Patient instructed to take medications as defined in the Anti-coagulation Track section of this encounter.  Patient instructed to take today's dose.  Patient instructed to take one-and-one-half (1&1/2) of your 5mg  peach-colored warfarin tablets all days of the week.  Patient verbalized understanding of these instructions.   Patient advised to contact clinic or  seek medical attention if signs/symptoms of bleeding or thromboembolism occur.  Patient verbalized understanding by repeating back information and was advised to contact me if further medication-related questions arise. Patient was also provided an information handout.  Follow-up Return in 5 weeks (on 05/03/2022) for Follow up INR.  [name], PharmD, CPP  15 minutes spent face-to-face with the patient during the encounter. 50% of time spent on education, including signs/sx bleeding and clotting, as well as food and drug interactions with warfarin. 50% of time was spent on fingerprick POC INR sample collection,processing, results determination, and documentation in [email or web address].

## 2022-03-29 NOTE — Patient Instructions (Signed)
Patient instructed to take medications as defined in the Anti-coagulation Track section of this encounter.  Patient instructed to take today's dose.  Patient instructed to take one-and-one-half (1&1/2) of your 5mg  peach-colored warfarin tablets all days of the week.  Patient verbalized understanding of these instructions.

## 2022-03-29 NOTE — Progress Notes (Signed)
INTERNAL MEDICINE TEACHING ATTENDING ADDENDUM ° °I agree with pharmacy recommendations as outlined in their note.  ° °-Mikko Lewellen MD ° °

## 2022-05-03 ENCOUNTER — Ambulatory Visit (INDEPENDENT_AMBULATORY_CARE_PROVIDER_SITE_OTHER): Payer: BC Managed Care – PPO | Admitting: Pharmacist

## 2022-05-03 DIAGNOSIS — I2699 Other pulmonary embolism without acute cor pulmonale: Secondary | ICD-10-CM | POA: Diagnosis not present

## 2022-05-03 DIAGNOSIS — Z7901 Long term (current) use of anticoagulants: Secondary | ICD-10-CM

## 2022-05-03 LAB — POCT INR: INR: 1.2 — AB (ref 2.0–3.0)

## 2022-05-03 NOTE — Progress Notes (Signed)
Anticoagulation Management Richard [name] is a 46 y.o. male who reports to the clinic for monitoring of warfarin treatment.    Indication: PE with infarction, history of; long term current use of warfarin therapy.  Duration: indefinite Supervising physician: Debe Coder  Anticoagulation Clinic Visit History: Patient does not report signs/symptoms of bleeding or thromboembolism  Other recent changes: No diet, medications, lifestyle changes. Anticoagulation Episode Summary     Current INR goal:  2.0-3.0  TTR:  56.8 % (10 y)  Next INR check:  05/31/2022  INR from last check:  1.2 (05/03/2022)  Weekly max warfarin dose:  45 mg  Target end date:  Indefinite  INR check location:  Anticoagulation Clinic  Preferred lab:    Send INR reminders to:     Indications   Pulmonary embolism and infarction (HCC) [I26.99] D V T (Resolved) [I80.299] Long term (current) use of anticoagulants [Z79.01]        Comments:           No Known Allergies  Current Outpatient Medications:    warfarin (COUMADIN) 5 MG tablet, Take 1.5 tablets (7.5 mg total) by mouth daily at 4 PM., Disp: 44 tablet, Rfl: 2 Past Medical History:  Diagnosis Date   D V T 10/09/2010   Qualifier: Diagnosis of  By: [name] MD, Comer Locket      DVT (deep venous thrombosis) (HCC)    Seasonal allergies    Social History   Socioeconomic History   Marital status: Single    Spouse name: Not on file   Number of children: Not on file   Years of education: Not on file   Highest education level: Not on file  Occupational History   Not on file  Tobacco Use   Smoking status: Former    Years: 1.00    Types: Cigarettes, Cigars    Quit date: 04/05/2012    Years since quitting: 10.0   Smokeless tobacco: Never  Substance and Sexual Activity   Alcohol use: No    Alcohol/week: 0.0 standard drinks of alcohol   Drug use: No   Sexual activity: Not on file  Other Topics Concern   Not on file  Social History Narrative   Not on file    Social Determinants of Health   Financial Resource Strain: Not on file  Food Insecurity: Not on file  Transportation Needs: Not on file  Physical Activity: Not on file  Stress: Not on file  Social Connections: Not on file   Family History  Problem Relation Age of Onset   Other Mother    Other Father    Pulmonary embolism Maternal Uncle     ASSESSMENT Recent Results: The most recent result is correlated with 52.5 mg per week: Lab Results  Component Value Date   INR 1.2 (A) 05/03/2022   INR 2.7 03/29/2022   INR 2.2 03/01/2022   PROTIME 20.4 (H) 03/16/2011    Anticoagulation Dosing: Description   Take 1 & 1/2 tablets on Sundays, Tuesdays, Thursdays and Saturdays. Take 2 (two) tablets on Mondays, Wednesdays and Fridays      INR today: Subtherapeutic  PLAN Weekly dose was increased by 14% to 60 mg per week  Patient Instructions  Patient instructed to take medications as defined in the Anti-coagulation Track section of this encounter.  Patient instructed to take today's dose.  Patient instructed to take 1 & 1/2 tablets on Sundays, Tuesdays, Thursdays and Saturdays. Take 2 (two) tablets on Mondays, Wednesdays and Fridays  Patient verbalized  understanding of these instructions.   Patient advised to contact clinic or seek medical attention if signs/symptoms of bleeding or thromboembolism occur.  Patient verbalized understanding by repeating back information and was advised to contact me if further medication-related questions arise. Patient was also provided an information handout.  Follow-up Return in 4 weeks (on 05/31/2022) for Follow up INR.  [name], PharmD, CPP  15 minutes spent face-to-face with the patient during the encounter. 50% of time spent on education, including signs/sx bleeding and clotting, as well as food and drug interactions with warfarin. 50% of time was spent on fingerprick POC INR sample collection,processing, results determination, and  documentation in [email or web address].

## 2022-05-03 NOTE — Patient Instructions (Signed)
Patient instructed to take medications as defined in the Anti-coagulation Track section of this encounter.  Patient instructed to take today's dose.  Patient instructed to take 1 & 1/2 tablets on Sundays, Tuesdays, Thursdays and Saturdays. Take 2 (two) tablets on Mondays, Wednesdays and Fridays  Patient verbalized understanding of these instructions.

## 2022-05-05 ENCOUNTER — Telehealth: Payer: Self-pay

## 2022-05-05 NOTE — Telephone Encounter (Addendum)
Patient called regarding his INR reading 1.2 on 9/11 patient wants to know how often does he need to be taking his medication according to schedule patient wants to know if he should go to the ED.

## 2022-05-05 NOTE — Telephone Encounter (Signed)
Return pt's call who stated his INR is 1.2 and he has been popping pills and has not heard from anyone on what to do. INR was done Monday 9/11. I called Chancy Milroy, infomed of INR and what pt said - he said to tell pt per his instructions: "Description  Take 1 & 1/2 tablets on Sundays, Tuesdays, Thursdays and Saturdays. Take 2 (two) tablets on Mondays, Wednesdays and Fridays  Return for your next INR. Monday  10/9   Pt repeated instructions.  I will also mail a copy to pt.

## 2022-05-31 ENCOUNTER — Ambulatory Visit: Payer: Self-pay | Admitting: Pharmacist

## 2022-05-31 DIAGNOSIS — Z7901 Long term (current) use of anticoagulants: Secondary | ICD-10-CM

## 2022-05-31 DIAGNOSIS — I2699 Other pulmonary embolism without acute cor pulmonale: Secondary | ICD-10-CM

## 2022-05-31 LAB — POCT INR: INR: 2.9 (ref 2.0–3.0)

## 2022-05-31 NOTE — Progress Notes (Signed)
Anticoagulation Management Richard [name] is a 46 y.o. male who reports to the clinic for monitoring of warfarin treatment.    Indication: PE with infarction, History of; Long term current use of oral anticoagulant, warfarin. Target INR range 2.0 - 3.0. Duration: indefinite Supervising physician:  Velna Ochs, MD  Anticoagulation Clinic Visit History: Patient does not report signs/symptoms of bleeding or thromboembolism  Other recent changes: No diet, medications, lifestyle changes endorsed by the patient at this visit.  Anticoagulation Episode Summary     Current INR goal:  2.0-3.0  TTR:  56.8 % (10.1 y)  Next INR check:  06/28/2022  INR from last check:  2.9 (05/31/2022)  Weekly max warfarin dose:  45 mg  Target end date:  Indefinite  INR check location:  Anticoagulation Clinic  Preferred lab:    Send INR reminders to:     Indications   Pulmonary embolism and infarction ([place]) [I26.99] D V T (Resolved) [I80.299] Long term (current) use of anticoagulants [Z79.01]        Comments:           No Known Allergies  Current Outpatient Medications:    warfarin (COUMADIN) 5 MG tablet, Take 1.5 tablets (7.5 mg total) by mouth daily at 4 PM., Disp: 44 tablet, Rfl: 2 Past Medical History:  Diagnosis Date   D V T 10/09/2010   Qualifier: Diagnosis of  By: Elsworth Soho MD, Leanna Sato      DVT (deep venous thrombosis) ([place])    Seasonal allergies    Social History   Socioeconomic History   Marital status: Single    Spouse name: Not on file   Number of children: Not on file   Years of education: Not on file   Highest education level: Not on file  Occupational History   Not on file  Tobacco Use   Smoking status: Former    Years: 1.00    Types: Cigarettes, Cigars    Quit date: 04/05/2012    Years since quitting: 10.1   Smokeless tobacco: Never  Substance and Sexual Activity   Alcohol use: No    Alcohol/week: 0.0 standard drinks of alcohol   Drug use: No   Sexual activity: Not on  file  Other Topics Concern   Not on file  Social History Narrative   Not on file   Social Determinants of Health   Financial Resource Strain: Not on file  Food Insecurity: Not on file  Transportation Needs: Not on file  Physical Activity: Not on file  Stress: Not on file  Social Connections: Not on file   Family History  Problem Relation Age of Onset   Other Mother    Other Father    Pulmonary embolism Maternal Uncle     ASSESSMENT Recent Results: The most recent result is correlated with 60 mg per week: Lab Results  Component Value Date   INR 2.9 05/31/2022   INR 1.2 (A) 05/03/2022   INR 2.7 03/29/2022   PROTIME 20.4 (H) 03/16/2011    Anticoagulation Dosing: Description   Take 1 & 1/2 tablets on Sundays, Mondays, Wednesdays, Thursdays, Saturdays. Take 2 (two) tablets on Tuesdays and Fridays.     INR today: Therapeutic  PLAN Weekly dose was decreased by 4% to 57.5 mg per week  Patient Instructions  Patient instructed to take medications as defined in the Anti-coagulation Track section of this encounter.  Patient instructed to take today's dose.  Patient instructed to take 1 & 1/2 tablets on Sundays, Mondays, Wednesdays,  Thursdays, Saturdays. Take 2 (two) tablets on Tuesdays and Fridays. Patient verbalized understanding of these instructions.  Patient advised to contact clinic or seek medical attention if signs/symptoms of bleeding or thromboembolism occur.  Patient verbalized understanding by repeating back information and was advised to contact me if further medication-related questions arise. Patient was also provided an information handout.  Follow-up Return in 4 weeks (on 06/28/2022) for Follow up INR.  [name], PharmD, CPP  15 minutes spent face-to-face with the patient during the encounter. 50% of time spent on education, including signs/sx bleeding and clotting, as well as food and drug interactions with warfarin. 50% of time was spent on  fingerprick POC INR sample collection,processing, results determination, and documentation in [email or web address]

## 2022-05-31 NOTE — Patient Instructions (Signed)
Patient instructed to take medications as defined in the Anti-coagulation Track section of this encounter.  Patient instructed to take today's dose.  Patient instructed to take 1 & 1/2 tablets on Sundays, Mondays, Wednesdays, Thursdays, Saturdays. Take 2 (two) tablets on Tuesdays and Fridays. Patient verbalized understanding of these instructions.

## 2022-06-28 ENCOUNTER — Ambulatory Visit: Payer: Self-pay | Admitting: Pharmacist

## 2022-06-28 DIAGNOSIS — Z7901 Long term (current) use of anticoagulants: Secondary | ICD-10-CM

## 2022-06-28 DIAGNOSIS — I2699 Other pulmonary embolism without acute cor pulmonale: Secondary | ICD-10-CM

## 2022-06-28 LAB — POCT INR: INR: 2 (ref 2.0–3.0)

## 2022-06-28 MED ORDER — WARFARIN SODIUM 5 MG PO TABS: ORAL_TABLET | ORAL | 2 refills | Status: DC

## 2022-06-28 NOTE — Progress Notes (Signed)
Anticoagulation Management Richard [name] is a 46 y.o. male who reports to the clinic for monitoring of warfarin treatment.    Indication: PE , History of with infarction; Long term current use of oral anticoagulant, warfarin. Target INR range 2.0 - 3.0.  Duration: indefinite Supervising physician: [facility] Clinic Visit History: Patient does not report signs/symptoms of bleeding or thromboembolism  Other recent changes: No diet, medications, lifestyle changes.  Anticoagulation Episode Summary     Current INR goal:  2.0-3.0  TTR:  57.1 % (10.2 y)  Next INR check:  07/19/2022  INR from last check:  2.0 (06/28/2022)  Weekly max warfarin dose:  45 mg  Target end date:  Indefinite  INR check location:  Anticoagulation Clinic  Preferred lab:    Send INR reminders to:     Indications   Pulmonary embolism and infarction ([place]) [I26.99] D V T (Resolved) [I80.299] Long term (current) use of anticoagulants [Z79.01]        Comments:           No Known Allergies  Current Outpatient Medications:    warfarin (COUMADIN) 5 MG tablet, Take two (2) tablets on Sundays, Tuesdays, Thursdays and Saturdays. Take only one-and-one-half (1 & 1/2) tablets on Mondays, Wednesdays and Fridays., Disp: 54 tablet, Rfl: 2 Past Medical History:  Diagnosis Date   D V T 10/09/2010   Qualifier: Diagnosis of  By: Elsworth Soho MD, Leanna Sato.      DVT (deep venous thrombosis) ([place])    Seasonal allergies    Social History   Socioeconomic History   Marital status: Single    Spouse name: Not on file   Number of children: Not on file   Years of education: Not on file   Highest education level: Not on file  Occupational History   Not on file  Tobacco Use   Smoking status: Former    Years: 1.00    Types: Cigarettes, Cigars    Quit date: 04/05/2012    Years since quitting: 10.2   Smokeless tobacco: Never  Substance and Sexual Activity   Alcohol use: No    Alcohol/week: 0.0 standard drinks of  alcohol   Drug use: No   Sexual activity: Not on file  Other Topics Concern   Not on file  Social History Narrative   Not on file   Social Determinants of Health   Financial Resource Strain: Not on file  Food Insecurity: Not on file  Transportation Needs: Not on file  Physical Activity: Not on file  Stress: Not on file  Social Connections: Not on file   Family History  Problem Relation Age of Onset   Other Mother    Other Father    Pulmonary embolism Maternal Uncle     ASSESSMENT Recent Results: The most recent result is correlated with 57.5 mg per week: Lab Results  Component Value Date   INR 2.0 06/28/2022   INR 2.9 05/31/2022   INR 1.2 (A) 05/03/2022   PROTIME 20.4 (H) 03/16/2011    Anticoagulation Dosing: Description   Take two (2) of your 5mg  peach-colored warfarin tablets on Sundays, Tuesdays, Thursdays and Saturdays. Take only one-and-one-half (1 & 1/2) tablets on Mondays, Wednesdays and Fridays.      INR today: Therapeutic  PLAN Weekly dose was increased by 9% to 62.5 mg per week  Patient Instructions  Patient instructed to take medications as defined in the Anti-coagulation Track section of this encounter.  Patient instructed to take today's dose.  Patient instructed to take two (2) of your 5mg  peach-colored warfarin tablets on Sundays, Tuesdays, Thursdays and Saturdays. Take only one-and-one-half (1 & 1/2) tablets on Mondays, Wednesdays and Fridays.  Patient verbalized understanding of these instructions.  Patient advised to contact clinic or seek medical attention if signs/symptoms of bleeding or thromboembolism occur.  Patient verbalized understanding by repeating back information and was advised to contact me if further medication-related questions arise. Patient was also provided an information handout.  Follow-up Return in about 3 weeks (around 07/19/2022) for Follow up INR.  07/21/2022, PharmD, CPP  15 minutes spent face-to-face with the  patient during the encounter. 50% of time spent on education, including signs/sx bleeding and clotting, as well as food and drug interactions with warfarin. 50% of time was spent on fingerprick POC INR sample collection,processing, results determination, and documentation in [name].

## 2022-06-28 NOTE — Patient Instructions (Signed)
Patient instructed to take medications as defined in the Anti-coagulation Track section of this encounter.  Patient instructed to take today's dose.  Patient instructed to take two (2) of your 5mg  peach-colored warfarin tablets on Sundays, Tuesdays, Thursdays and Saturdays. Take only one-and-one-half (1 & 1/2) tablets on Mondays, Wednesdays and Fridays.  Patient verbalized understanding of these instructions.

## 2022-07-19 ENCOUNTER — Other Ambulatory Visit: Payer: Self-pay | Admitting: Pharmacist

## 2022-07-19 ENCOUNTER — Ambulatory Visit: Payer: Self-pay | Admitting: Pharmacist

## 2022-07-19 DIAGNOSIS — I2699 Other pulmonary embolism without acute cor pulmonale: Secondary | ICD-10-CM

## 2022-07-19 DIAGNOSIS — Z7901 Long term (current) use of anticoagulants: Secondary | ICD-10-CM

## 2022-07-19 LAB — POCT INR: INR: 2.1 (ref 2.0–3.0)

## 2022-07-19 MED ORDER — WARFARIN SODIUM 5 MG PO TABS: 10.0000 mg | ORAL_TABLET | Freq: Every day | ORAL | 2 refills | Status: DC

## 2022-07-19 NOTE — Patient Instructions (Signed)
Patient instructed to take medications as defined in the Anti-coagulation Track section of this encounter.  Patient instructed to take today's dose.  Patient instructed to take two (2) of your 5mg  peach-colored warfarin tablets each day of the week.  Patient verbalized understanding of these instructions.

## 2022-07-19 NOTE — Progress Notes (Signed)
Anticoagulation Management Richard [name] is a 46 y.o. male who reports to the clinic for monitoring of warfarin treatment.    Indication: PE, History of, with infarction. Long term current use of oral anticoagulant, warfarin. Target INR range, 2.0 - 3.0.  Duration: indefinite Supervising physician: Earl Lagos  Anticoagulation Clinic Visit History: Patient does not report signs/symptoms of bleeding or thromboembolism  Other recent changes: No diet, medications, lifestyle changes cited by the patient.  Anticoagulation Episode Summary     Current INR goal:  2.0-3.0  TTR:  57.3 % (10.2 y)  Next INR check:  08/30/2022  INR from last check:  2.1 (07/19/2022)  Weekly max warfarin dose:  45 mg  Target end date:  Indefinite  INR check location:  Anticoagulation Clinic  Preferred lab:    Send INR reminders to:     Indications   Pulmonary embolism and infarction (HCC) [I26.99] D V T (Resolved) [I80.299] Long term (current) use of anticoagulants [Z79.01]        Comments:           No Known Allergies  Current Outpatient Medications:    warfarin (COUMADIN) 5 MG tablet, Take two (2) tablets on Sundays, Tuesdays, Thursdays and Saturdays. Take only one-and-one-half (1 & 1/2) tablets on Mondays, Wednesdays and Fridays., Disp: 54 tablet, Rfl: 2 Past Medical History:  Diagnosis Date   D V T 10/09/2010   Qualifier: Diagnosis of  By: [name] MD, [place].      DVT (deep venous thrombosis) (HCC)    Seasonal allergies    Social History   Socioeconomic History   Marital status: Single    Spouse name: Not on file   Number of children: Not on file   Years of education: Not on file   Highest education level: Not on file  Occupational History   Not on file  Tobacco Use   Smoking status: Former    Years: 1.00    Types: Cigarettes, Cigars    Quit date: 04/05/2012    Years since quitting: 10.2   Smokeless tobacco: Never  Substance and Sexual Activity   Alcohol use: No    Alcohol/week:  0.0 standard drinks of alcohol   Drug use: No   Sexual activity: Not on file  Other Topics Concern   Not on file  Social History Narrative   Not on file   Social Determinants of Health   Financial Resource Strain: Not on file  Food Insecurity: Not on file  Transportation Needs: Not on file  Physical Activity: Not on file  Stress: Not on file  Social Connections: Not on file   Family History  Problem Relation Age of Onset   Other Mother    Other Father    Pulmonary embolism Maternal Uncle     ASSESSMENT Recent Results: The most recent result is correlated with 62.5 mg per week: Lab Results  Component Value Date   INR 2.1 07/19/2022   INR 2.0 06/28/2022   INR 2.9 05/31/2022   PROTIME 20.4 (H) 03/16/2011    Anticoagulation Dosing: Description   Take two (2) of your 5mg  peach-colored warfarin tablets each day of the week.      INR today: Therapeutic  PLAN Weekly dose was increased by 12% to 70 mg per week  Patient Instructions  Patient instructed to take medications as defined in the Anti-coagulation Track section of this encounter.  Patient instructed to take today's dose.  Patient instructed to take two (2) of your 5mg  peach-colored warfarin  tablets each day of the week.  Patient verbalized understanding of these instructions.  Patient advised to contact clinic or seek medical attention if signs/symptoms of bleeding or thromboembolism occur.  Patient verbalized understanding by repeating back information and was advised to contact me if further medication-related questions arise. Patient was also provided an information handout.  Follow-up Return in 6 weeks (on 08/30/2022) for Follow up INR.  [name], PharmD, CPP  15 minutes spent face-to-face with the patient during the encounter. 50% of time spent on education, including signs/sx bleeding and clotting, as well as food and drug interactions with warfarin. 50% of time was spent on fingerprick POC INR  sample collection,processing, results determination, and documentation in [email or web address]

## 2022-07-21 NOTE — Progress Notes (Signed)
INTERNAL MEDICINE TEACHING ATTENDING ADDENDUM - Trayquan Kolakowski M.D  Duration- indefinite, Indication- recurrent PE, INR- therapeutic. Agree with pharmacy recommendations as outlined in their note.      

## 2022-08-30 ENCOUNTER — Ambulatory Visit (INDEPENDENT_AMBULATORY_CARE_PROVIDER_SITE_OTHER): Payer: 59 | Admitting: Pharmacist

## 2022-08-30 DIAGNOSIS — I2699 Other pulmonary embolism without acute cor pulmonale: Secondary | ICD-10-CM

## 2022-08-30 DIAGNOSIS — Z7901 Long term (current) use of anticoagulants: Secondary | ICD-10-CM

## 2022-08-30 LAB — POCT INR: INR: 2 (ref 2.0–3.0)

## 2022-08-30 MED ORDER — WARFARIN SODIUM 5 MG PO TABS: ORAL_TABLET | ORAL | 2 refills | Status: DC

## 2022-08-30 NOTE — Patient Instructions (Signed)
Patient instructed to take medications as defined in the Anti-coagulation Track section of this encounter.  Patient instructed to take today's dose.  Patient instructed to take  two (2) of your 5mg  peach-colored warfarin tablets on all days of the week--EXCEPT on MONDAYS and THURSDAYS, take three (3) of your 5mg  peach-colored warfarin tablets on these days.  Patient verbalized understanding of these instructions.

## 2022-08-30 NOTE — Progress Notes (Signed)
Anticoagulation Management Richard [name] is a 47 y.o. male who reports to the clinic for monitoring of warfarin treatment.    Indication: PE, History of--with infarction. Long term current use of oral anticoagulant, warfarin. INR target range 2.0 - 3.0.  Duration: indefinite Supervising physician: Joni Reining  Anticoagulation Clinic Visit History: Patient does not report signs/symptoms of bleeding or thromboembolism  Other recent changes: No diet, medications, lifestyle changes. f Anticoagulation Episode Summary     Current INR goal:  2.0-3.0  TTR:  57.8 % (10.3 y)  Next INR check:  10/11/2022  INR from last check:  2.0 (08/30/2022)  Weekly max warfarin dose:  45 mg  Target end date:  Indefinite  INR check location:  Anticoagulation Clinic  Preferred lab:    Send INR reminders to:     Indications   Pulmonary embolism and infarction ([place]) [I26.99] D V T (Resolved) [I80.299] Long term (current) use of anticoagulants [Z79.01]        Comments:           No Known Allergies  Current Outpatient Medications:    warfarin (COUMADIN) 5 MG tablet, Take three (3) of your 5 mg peach-colored warfarin tablets on Mondays and Thursdays. All other days, take only one (1) tablet., Disp: 64 tablet, Rfl: 2 Past Medical History:  Diagnosis Date   D V T 10/09/2010   Qualifier: Diagnosis of  By: Elsworth Soho MD, Leanna Sato.      DVT (deep venous thrombosis) ([place])    Seasonal allergies    Social History   Socioeconomic History   Marital status: Single    Spouse name: Not on file   Number of children: Not on file   Years of education: Not on file   Highest education level: Not on file  Occupational History   Not on file  Tobacco Use   Smoking status: Former    Years: 1.00    Types: Cigarettes, Cigars    Quit date: 04/05/2012    Years since quitting: 10.4   Smokeless tobacco: Never  Substance and Sexual Activity   Alcohol use: No    Alcohol/week: 0.0 standard drinks of alcohol   Drug use: No    Sexual activity: Not on file  Other Topics Concern   Not on file  Social History Narrative   Not on file   Social Determinants of Health   Financial Resource Strain: Not on file  Food Insecurity: Not on file  Transportation Needs: Not on file  Physical Activity: Not on file  Stress: Not on file  Social Connections: Not on file   Family History  Problem Relation Age of Onset   Other Mother    Other Father    Pulmonary embolism Maternal Uncle     ASSESSMENT Recent Results: The most recent result is correlated with 70 mg per week: Lab Results  Component Value Date   INR 2.0 08/30/2022   INR 2.1 07/19/2022   INR 2.0 06/28/2022   PROTIME 20.4 (H) 03/16/2011    Anticoagulation Dosing: Description   Take two (2) of your 5mg  peach-colored warfarin tablets on all days of the week--EXCEPT on MONDAYS and THURSDAYS, take three (3) of your 5mg  peach-colored warfarin tablets on these days.       INR today: Therapeutic  PLAN Weekly dose was increased by 14% to 80 mg per week  Patient Instructions  Patient instructed to take medications as defined in the Anti-coagulation Track section of this encounter.  Patient instructed to take today's  dose.  Patient instructed to take  two (2) of your 5mg  peach-colored warfarin tablets on all days of the week--EXCEPT on MONDAYS and THURSDAYS, take three (3) of your 5mg  peach-colored warfarin tablets on these days.  Patient verbalized understanding of these instructions.  Patient advised to contact clinic or seek medical attention if signs/symptoms of bleeding or thromboembolism occur.  Patient verbalized understanding by repeating back information and was advised to contact me if further medication-related questions arise. Patient was also provided an information handout.  Follow-up Return in 6 weeks (on 10/11/2022) for Follow up INR.  , PharmD, CPP  15 minutes spent face-to-face with the patient during the encounter. 50%  of time spent on education, including signs/sx bleeding and clotting, as well as food and drug interactions with warfarin. 50% of time was spent on fingerprick POC INR sample collection,processing, results determination, and documentation in 10/13/2022.

## 2022-10-11 ENCOUNTER — Ambulatory Visit (INDEPENDENT_AMBULATORY_CARE_PROVIDER_SITE_OTHER): Payer: 59 | Admitting: Pharmacist

## 2022-10-11 DIAGNOSIS — Z7901 Long term (current) use of anticoagulants: Secondary | ICD-10-CM

## 2022-10-11 DIAGNOSIS — I2699 Other pulmonary embolism without acute cor pulmonale: Secondary | ICD-10-CM | POA: Diagnosis not present

## 2022-10-11 LAB — POCT INR
INR: 9.2 — AB (ref 2.0–3.0)
POC INR: >8

## 2022-10-11 LAB — PROTIME-INR
INR: 9.2 (ref 0.8–1.2)
Prothrombin Time: 74.2 seconds — ABNORMAL HIGH (ref 11.4–15.2)

## 2022-10-11 NOTE — Progress Notes (Signed)
Anticoagulation Management Richard [name] is a 47 y.o. male who reports to the clinic for monitoring of warfarin treatment.    Indication: PE with infarction, history of; long term current use of oral anticoagulant, warfarin with target INR range 2.0 - 3.0.  Duration: indefinite Supervising physician: Joni Reining  Anticoagulation Clinic Visit History: Patient does not report signs/symptoms of bleeding or thromboembolism  Other recent changes: No diet, medications, lifestyle changes identified by the patient at this visit.  Anticoagulation Episode Summary     Current INR goal:  2.0-3.0  TTR:  57.3 % (10.5 y)  Next INR check:  10/14/2022  INR from last check:  >8.0 (10/11/2022)  Weekly max warfarin dose:  45 mg  Target end date:  Indefinite  INR check location:  Anticoagulation Clinic  Preferred lab:    Send INR reminders to:     Indications   Pulmonary embolism and infarction ([place]) [I26.99] D V T (Resolved) [I80.299] Long term (current) use of anticoagulants [Z79.01]        Comments:           No Known Allergies  Current Outpatient Medications:    warfarin (COUMADIN) 5 MG tablet, Take three (3) of your 5 mg peach-colored warfarin tablets on Mondays and Thursdays. All other days, take only one (1) tablet., Disp: 64 tablet, Rfl: 2 Past Medical History:  Diagnosis Date   D V T 10/09/2010   Qualifier: Diagnosis of  By: Elsworth Soho MD, Leanna Sato.      DVT (deep venous thrombosis) ([place])    Seasonal allergies    Social History   Socioeconomic History   Marital status: Single    Spouse name: Not on file   Number of children: Not on file   Years of education: Not on file   Highest education level: Not on file  Occupational History   Not on file  Tobacco Use   Smoking status: Former    Years: 1.00    Types: Cigarettes, Cigars    Quit date: 04/05/2012    Years since quitting: 10.5   Smokeless tobacco: Never  Substance and Sexual Activity   Alcohol use: No    Alcohol/week: 0.0  standard drinks of alcohol   Drug use: No   Sexual activity: Not on file  Other Topics Concern   Not on file  Social History Narrative   Not on file   Social Determinants of Health   Financial Resource Strain: Not on file  Food Insecurity: Not on file  Transportation Needs: Not on file  Physical Activity: Not on file  Stress: Not on file  Social Connections: Not on file   Family History  Problem Relation Age of Onset   Other Mother    Other Father    Pulmonary embolism Maternal Uncle     ASSESSMENT Recent Results: The most recent result is correlated with 68fmg per week: Lab Results  Component Value Date   INR 9.2 (A) 10/11/2022   INR >8.0 10/11/2022   INR 9.2 (HH) 10/11/2022   PROTIME 20.4 (H) 03/16/2011    Anticoagulation Dosing:   INR today: Supratherapeutic  PLAN Weekly dose was decreased by omitting doses for today (Monday, February 19), tomorrow (Tuesday, February 20) and Wednesday, October 13, 2022. Patient was instructed to purchase 3 bottles of Lipton's Green Tea. Drink two bottles today, one bottle tomorrow. NO WARFARIN until after being seen on Thursday, October 14, 2022 at 10:00 am. Patient advised to contact me, the clinic or seek medical  attention if any signs or symptoms of bleeding were to occur.   Patient Instructions  Patient instructed to take medications as defined in the Anti-coagulation Track section of this encounter.  Patient instructed to OMIT today's dose.  Patient instructed to  OMIT doses for Monday, Tuesday and Wednesday (February 19, 20 AND 21). RETURN TO CLINIC ON THURSDAY, FEBRUARY 22 FOR REPEAT INR. PLEASE PURCHASE THREE BOTTLES OF LIPTON'S GREEN TEA. DRINK TWO 8OZ BOTTLES TODAY AND ONE 8OZ BOTTLE TOMORROW. NO WARFARIN UNTIL AFTER SEEN IN THE CLINIC ON THURSDAY.  Patient verbalized understanding of these instructions.  Patient advised to contact clinic or seek medical attention if signs/symptoms of bleeding or thromboembolism  occur.  Patient verbalized understanding by repeating back information and was advised to contact me if further medication-related questions arise. Patient was also provided an information handout.  Follow-up Return in 3 days (on 10/14/2022) for Follow up INR.  Richard [name]  15 minutes spent face-to-face with the patient during the encounter. 50% of time spent on education, including signs/sx bleeding and clotting, as well as food and drug interactions with warfarin. 50% of time was spent on fingerprick POC INR sample collection,processing, results determination, and documentation in [email or web address]

## 2022-10-11 NOTE — Patient Instructions (Signed)
Patient instructed to take medications as defined in the Anti-coagulation Track section of this encounter.  Patient instructed to OMIT today's dose.  Patient instructed to  OMIT doses for Monday, Tuesday and Wednesday (February 19, 20 AND 21). RETURN TO CLINIC ON THURSDAY, FEBRUARY 22 FOR REPEAT INR. PLEASE PURCHASE THREE BOTTLES OF LIPTON'S GREEN TEA. DRINK TWO 8OZ BOTTLES TODAY AND ONE 8OZ BOTTLE TOMORROW. NO WARFARIN UNTIL AFTER SEEN IN THE CLINIC ON THURSDAY.  Patient verbalized understanding of these instructions.

## 2022-10-14 ENCOUNTER — Ambulatory Visit (INDEPENDENT_AMBULATORY_CARE_PROVIDER_SITE_OTHER): Payer: 59 | Admitting: Pharmacist

## 2022-10-14 DIAGNOSIS — Z7901 Long term (current) use of anticoagulants: Secondary | ICD-10-CM | POA: Diagnosis not present

## 2022-10-14 DIAGNOSIS — I2699 Other pulmonary embolism without acute cor pulmonale: Secondary | ICD-10-CM

## 2022-10-14 LAB — POCT INR: INR: 4.2 — AB (ref 2.0–3.0)

## 2022-10-14 NOTE — Progress Notes (Signed)
Anticoagulation Management Richard [name] is a 47 y.o. male who reports to the clinic for monitoring of warfarin treatment.    Indication: PE with infarction with recurrence, unprovoked while off warfarin after his initial index event; long term current use of oral anticoagulation, warfarin with target INR range 2.0 - 3.0. Duration: indefinite Supervising physician:  Lottie Mussel, MD  Anticoagulation Clinic Visit History: Patient does not report signs/symptoms of bleeding or thromboembolism  Other recent changes: No diet, medications, lifestyle except as noted in patient findings.  Anticoagulation Episode Summary     Current INR goal:  2.0-3.0  TTR:  57.3 % (10.5 y)  Next INR check:  10/18/2022  INR from last check:  4.2 (10/14/2022)  Weekly max warfarin dose:  45 mg  Target end date:  Indefinite  INR check location:  Anticoagulation Clinic  Preferred lab:    Send INR reminders to:     Indications   Pulmonary embolism and infarction ([place]) [I26.99] D V T (Resolved) [I80.299] Long term (current) use of anticoagulants [Z79.01]        Comments:           No Known Allergies  Current Outpatient Medications:    warfarin (COUMADIN) 5 MG tablet, Take three (3) of your 5 mg peach-colored warfarin tablets on Mondays and Thursdays. All other days, take only one (1) tablet., Disp: 64 tablet, Rfl: 2 Past Medical History:  Diagnosis Date   D V T 10/09/2010   Qualifier: Diagnosis of  By: Elsworth Soho MD, Leanna Sato.      DVT (deep venous thrombosis) ([place])    Seasonal allergies    Social History   Socioeconomic History   Marital status: Single    Spouse name: Not on file   Number of children: Not on file   Years of education: Not on file   Highest education level: Not on file  Occupational History   Not on file  Tobacco Use   Smoking status: Former    Years: 1.00    Types: Cigarettes, Cigars    Quit date: 04/05/2012    Years since quitting: 10.5   Smokeless tobacco: Never  Substance  and Sexual Activity   Alcohol use: No    Alcohol/week: 0.0 standard drinks of alcohol   Drug use: No   Sexual activity: Not on file  Other Topics Concern   Not on file  Social History Narrative   Not on file   Social Determinants of Health   Financial Resource Strain: Not on file  Food Insecurity: Not on file  Transportation Needs: Not on file  Physical Activity: Not on file  Stress: Not on file  Social Connections: Not on file   Family History  Problem Relation Age of Onset   Other Mother    Other Father    Pulmonary embolism Maternal Uncle     ASSESSMENT Recent Results: The most recent result is correlated with having omitted doses of warfarin on Tuesday, Wednesday and today with use of Lipton's green tea as a vitamin K1 source (delivering 'microgram' doses of vitamin K1): Lab Results  Component Value Date   INR 4.2 (A) 10/14/2022   INR 9.2 (A) 10/11/2022   INR >8.0 10/11/2022   PROTIME 20.4 (H) 03/16/2011    Anticoagulation Dosing: Description   Follow up INR on Monday [id number] at 3:00 pm.      INR today: Supratherapeutic  PLAN Weekly dose was decreased by omitting today's dose as well and recommencing dosing tomorrow.   Patient  Instructions  Patient instructed to take medications as defined in the Anti-coagulation Track section of this encounter.  Patient instructed to OMIT today's dose.  Patient instructed to take 2 of his 62m peach-colored warfarin tablets on Friday, February 23; 1 and 1/2 of his 576mpeach-colored warfarin tablets on Saturday, February 24 and Sunday, October 17, 2022. Return to clinic for repeat INR on Monday October 18, 2022 at 3:00pm. Patient verbalized understanding of these instructions.  Patient advised to contact clinic or seek medical attention if signs/symptoms of bleeding or thromboembolism occur.  Patient verbalized understanding by repeating back information and was advised to contact me if further medication-related questions  arise. Patient was also provided an information handout.  Follow-up Return in 4 days (on 10/18/2022) for Follow up INR.  JaPennie BanterPharmD, CPP  15 minutes spent face-to-face with the patient during the encounter. 50% of time spent on education, including signs/sx bleeding and clotting, as well as food and drug interactions with warfarin. 50% of time was spent on fingerprick POC INR sample collection,processing, results determination, and documentation in [email or web address]

## 2022-10-14 NOTE — Patient Instructions (Signed)
Patient instructed to take medications as defined in the Anti-coagulation Track section of this encounter.  Patient instructed to OMIT today's dose.  Patient instructed to take 2 of his 53m peach-colored warfarin tablets on Friday, February 23; 1 and 1/2 of his 514mpeach-colored warfarin tablets on Saturday, February 24 and Sunday, October 17, 2022. Return to clinic for repeat INR on Monday October 18, 2022 at 3:00pm. Patient verbalized understanding of these instructions.

## 2022-10-18 ENCOUNTER — Ambulatory Visit (INDEPENDENT_AMBULATORY_CARE_PROVIDER_SITE_OTHER): Payer: 59 | Admitting: Pharmacist

## 2022-10-18 DIAGNOSIS — Z7901 Long term (current) use of anticoagulants: Secondary | ICD-10-CM

## 2022-10-18 DIAGNOSIS — I2699 Other pulmonary embolism without acute cor pulmonale: Secondary | ICD-10-CM

## 2022-10-18 LAB — POCT INR: INR: 3.2 — AB (ref 2.0–3.0)

## 2022-10-18 NOTE — Progress Notes (Signed)
Anticoagulation Management Richard [name] is a 47 y.o. male who reports to the clinic for monitoring of warfarin treatment.    Indication: PE with infarction, history of; long term current use of oral anticoagulation with warfarin, target INR range 2.0 - 3.0.  Duration: indefinite Supervising physician: [facility] Visit History: Patient does not report signs/symptoms of bleeding or thromboembolism  Other recent changes: No diet, medications, lifestyle changes.  Anticoagulation Episode Summary     Current INR goal:  2.0-3.0  TTR:  57.2 % (10.5 y)  Next INR check:  11/08/2022  INR from last check:  3.2 (10/18/2022)  Weekly max warfarin dose:  45 mg  Target end date:  Indefinite  INR check location:  Anticoagulation Clinic  Preferred lab:    Send INR reminders to:     Indications   Pulmonary embolism and infarction ([place]) [I26.99] D V T (Resolved) [I80.299] Long term (current) use of anticoagulants [Z79.01]        Comments:           No Known Allergies  Current Outpatient Medications:    warfarin (COUMADIN) 5 MG tablet, Take three (3) of your 5 mg peach-colored warfarin tablets on Mondays and Thursdays. All other days, take only one (1) tablet., Disp: 64 tablet, Rfl: 2 Past Medical History:  Diagnosis Date   D V T 10/09/2010   Qualifier: Diagnosis of  By: Elsworth Soho MD, Leanna Sato.      DVT (deep venous thrombosis) ([place])    Seasonal allergies    Social History   Socioeconomic History   Marital status: Single    Spouse name: Not on file   Number of children: Not on file   Years of education: Not on file   Highest education level: Not on file  Occupational History   Not on file  Tobacco Use   Smoking status: Former    Years: 1.00    Types: Cigarettes, Cigars    Quit date: 04/05/2012    Years since quitting: 10.5   Smokeless tobacco: Never  Substance and Sexual Activity   Alcohol use: No    Alcohol/week: 0.0 standard drinks of alcohol   Drug  use: No   Sexual activity: Not on file  Other Topics Concern   Not on file  Social History Narrative   Not on file   Social Determinants of Health   Financial Resource Strain: Not on file  Food Insecurity: Not on file  Transportation Needs: Not on file  Physical Activity: Not on file  Stress: Not on file  Social Connections: Not on file   Family History  Problem Relation Age of Onset   Other Mother    Other Father    Pulmonary embolism Maternal Uncle     ASSESSMENT Recent Results: The most recent result is correlated with 25 mg per week after doses were omitted secondary to a hypoprothrombinemic state: Lab Results  Component Value Date   INR 3.2 (A) 10/18/2022   INR 4.2 (A) 10/14/2022   INR 9.2 (A) 10/11/2022   INR >8.0 10/11/2022   PROTIME 20.4 (H) 03/16/2011    Anticoagulation Dosing: Description   Follow up INR at 10:00 am on Monday, November 08, 2022.     INR today: Supratherapeutic  PLAN Weekly dose was increased as shown in the anticoagulation tracking section as well as in the patient instructions provided to the patient.   Patient Instructions  Patient instructed to take medications as defined in the Anti-coagulation Track section  of this encounter.  Patient instructed to take today's dose.  Patient instructed to take one (1) of your '5mg'$  strength, peach-colored warfarin tablets on Mondays, Tuesdays and Thursdays. On Sundays, Wednesdays, Fridays and Saturdays, take one-and-one-half (1 & 1/2) of your '5mg'$  strength, peach-colored warfarin tablets.  Patient verbalized understanding of these instructions.  Patient advised to contact clinic or seek medical attention if signs/symptoms of bleeding or thromboembolism occur.  Patient verbalized understanding by repeating back information and was advised to contact me if further medication-related questions arise. Patient was also provided an information handout.  Follow-up Return in 3 weeks (on 11/08/2022) for Follow  up INR.  [name], PharmD, CPP  15 minutes spent face-to-face with the patient during the encounter. 50% of time spent on education, including signs/sx bleeding and clotting, as well as food and drug interactions with warfarin. 50% of time was spent on fingerprick POC INR sample collection,processing, results determination, and documentation in [email or web address]

## 2022-10-18 NOTE — Progress Notes (Signed)
Evaluation and management procedures were performed by the Clinical Pharmacy Practitioner under my supervision and collaboration. I have reviewed the Practitioner's note and chart, and I agree with the management and plan as documented above. ° °

## 2022-10-18 NOTE — Patient Instructions (Signed)
Patient instructed to take medications as defined in the Anti-coagulation Track section of this encounter.  Patient instructed to take today's dose.  Patient instructed to take one (1) of your '5mg'$  strength, peach-colored warfarin tablets on Mondays, Tuesdays and Thursdays. On Sundays, Wednesdays, Fridays and Saturdays, take one-and-one-half (1 & 1/2) of your '5mg'$  strength, peach-colored warfarin tablets.  Patient verbalized understanding of these instructions.

## 2022-11-08 ENCOUNTER — Ambulatory Visit (INDEPENDENT_AMBULATORY_CARE_PROVIDER_SITE_OTHER): Payer: 59 | Admitting: Pharmacist

## 2022-11-08 DIAGNOSIS — I2699 Other pulmonary embolism without acute cor pulmonale: Secondary | ICD-10-CM | POA: Diagnosis not present

## 2022-11-08 DIAGNOSIS — Z7901 Long term (current) use of anticoagulants: Secondary | ICD-10-CM

## 2022-11-08 LAB — POCT INR: INR: 2.2 (ref 2.0–3.0)

## 2022-11-08 NOTE — Progress Notes (Signed)
Anticoagulation Management Richard [name] is a 47 y.o. male who reports to the clinic for monitoring of warfarin treatment.    Indication: PE with infarction, history of; Long term current use of oral anticoagulant, warfarin. Target INR range is 2.0 - 3.0.  Duration: indefinite Supervising physician:  Lottie Mussel, MD  Anticoagulation Clinic Visit History: Patient does not report signs/symptoms of bleeding or thromboembolism  Other recent changes: No diet, medications, lifestyle changes.  Anticoagulation Episode Summary     Current INR goal:  2.0-3.0  TTR:  57.3 % (10.5 y)  Next INR check:  12/06/2022  INR from last check:  2.2 (11/08/2022)  Weekly max warfarin dose:  45 mg  Target end date:  Indefinite  INR check location:  Anticoagulation Clinic  Preferred lab:    Send INR reminders to:     Indications   Pulmonary embolism and infarction ([place]) [I26.99] D V T (Resolved) [I80.299] Long term (current) use of anticoagulants [Z79.01]        Comments:           No Known Allergies  Current Outpatient Medications:    warfarin (COUMADIN) 5 MG tablet, Take three (3) of your 5 mg peach-colored warfarin tablets on Mondays and Thursdays. All other days, take only one (1) tablet., Disp: 64 tablet, Rfl: 2 Past Medical History:  Diagnosis Date   D V T 10/09/2010   Qualifier: Diagnosis of  By: Elsworth Soho MD, Leanna Sato.      DVT (deep venous thrombosis) ([place])    Seasonal allergies    Social History   Socioeconomic History   Marital status: Single    Spouse name: Not on file   Number of children: Not on file   Years of education: Not on file   Highest education level: Not on file  Occupational History   Not on file  Tobacco Use   Smoking status: Former    Years: 1    Types: Cigarettes, Cigars    Quit date: 04/05/2012    Years since quitting: 10.6   Smokeless tobacco: Never  Substance and Sexual Activity   Alcohol use: No    Alcohol/week: 0.0 standard drinks of alcohol   Drug use:  No   Sexual activity: Not on file  Other Topics Concern   Not on file  Social History Narrative   Not on file   Social Determinants of Health   Financial Resource Strain: Not on file  Food Insecurity: Not on file  Transportation Needs: Not on file  Physical Activity: Not on file  Stress: Not on file  Social Connections: Not on file   Family History  Problem Relation Age of Onset   Other Mother    Other Father    Pulmonary embolism Maternal Uncle     ASSESSMENT Recent Results: The most recent result is correlated with 45 mg per week: Lab Results  Component Value Date   INR 2.2 11/08/2022   INR 3.2 (A) 10/18/2022   INR 4.2 (A) 10/14/2022   PROTIME 20.4 (H) 03/16/2011    Anticoagulation Dosing: Description   Take one (1) tablet of your 5mg  peach colored warfarin tablets on Mondays, Wednesdays and Fridays. On Sundays, Tuesdays, Thursdays and Saturdays--take one-and-one-half (1 & 1/2) tablets.     INR today: Therapeutic  PLAN Weekly dose was unchanged.   Patient Instructions  Patient instructed to take medications as defined in the Anti-coagulation Track section of this encounter.  Patient instructed to take today's dose.  Patient instructed to take  one (1) tablet of your 5mg  peach colored warfarin tablets on Mondays, Wednesdays and Fridays. On Sundays, Tuesdays, Thursdays and Saturdays--take one-and-one-half (1 & 1/2) tablets. Patient verbalized understanding of these instructions.  Patient advised to contact clinic or seek medical attention if signs/symptoms of bleeding or thromboembolism occur.  Patient verbalized understanding by repeating back information and was advised to contact me if further medication-related questions arise. Patient was also provided an information handout.  Follow-up Return in 4 weeks (on 12/06/2022) for Follow up INR.  [name], PharmD, CPP  15 minutes spent face-to-face with the patient during the encounter. 50% of time spent on  education, including signs/sx bleeding and clotting, as well as food and drug interactions with warfarin. 50% of time was spent on fingerprick POC INR sample collection,processing, results determination, and documentation in [email or web address]

## 2022-11-08 NOTE — Patient Instructions (Signed)
Patient instructed to take medications as defined in the Anti-coagulation Track section of this encounter.  Patient instructed to take today's dose.  Patient instructed to take one (1) tablet of your 5mg  peach colored warfarin tablets on Mondays, Wednesdays and Fridays. On Sundays, Tuesdays, Thursdays and Saturdays--take one-and-one-half (1 & 1/2) tablets. Patient verbalized understanding of these instructions.

## 2022-12-06 ENCOUNTER — Ambulatory Visit (INDEPENDENT_AMBULATORY_CARE_PROVIDER_SITE_OTHER): Payer: 59 | Admitting: Pharmacist

## 2022-12-06 DIAGNOSIS — Z7901 Long term (current) use of anticoagulants: Secondary | ICD-10-CM

## 2022-12-06 DIAGNOSIS — I2699 Other pulmonary embolism without acute cor pulmonale: Secondary | ICD-10-CM | POA: Diagnosis not present

## 2022-12-06 LAB — POCT INR: INR: 1.2 — AB (ref 2.0–3.0)

## 2022-12-06 MED ORDER — WARFARIN SODIUM 5 MG PO TABS: 7.5000 mg | ORAL_TABLET | Freq: Every day | ORAL | 2 refills | Status: DC

## 2022-12-06 NOTE — Progress Notes (Signed)
Anticoagulation Management Richard [name] is a 47 y.o. male who reports to the clinic for monitoring of warfarin treatment.    Indication: PE with infarction, History of; Long term current use of oral anticoagulation warfarin with target INR range 2.0 - 3.0.  Duration: indefinite Supervising physician: Carlynn Purl  Anticoagulation Clinic Visit History: Patient does not report signs/symptoms of bleeding or thromboembolism  Other recent changes: No diet, medications, lifestyle except as noted in patient findings, "eating more salads." Anticoagulation Episode Summary     Current INR goal:  2.0-3.0  TTR:  57.1 % (10.6 y)  Next INR check:  12/27/2022  INR from last check:  1.2 (12/06/2022)  Weekly max warfarin dose:  45 mg  Target end date:  Indefinite  INR check location:  Anticoagulation Clinic  Preferred lab:    Send INR reminders to:     Indications   Pulmonary embolism and infarction [I26.99] D V T (Resolved) [I80.299] Long term (current) use of anticoagulants [Z79.01]        Comments:           No Known Allergies  Current Outpatient Medications:    warfarin (COUMADIN) 5 MG tablet, Take 1.5 tablets (7.5 mg total) by mouth daily at 4 PM., Disp: 48 tablet, Rfl: 2 Past Medical History:  Diagnosis Date   D V T 10/09/2010   Qualifier: Diagnosis of  By: [name] MD, Comer Locket      DVT (deep venous thrombosis) ([facility])    Seasonal allergies    Social History   Socioeconomic History   Marital status: Single    Spouse name: Not on file   Number of children: Not on file   Years of education: Not on file   Highest education level: Not on file  Occupational History   Not on file  Tobacco Use   Smoking status: Former    Years: 1    Types: Cigarettes, Cigars    Quit date: 04/05/2012    Years since quitting: 10.6   Smokeless tobacco: Never  Substance and Sexual Activity   Alcohol use: No    Alcohol/week: 0.0 standard drinks of alcohol   Drug use: No   Sexual activity: Not on  file  Other Topics Concern   Not on file  Social History Narrative   Not on file   Social Determinants of Health   Financial Resource Strain: Not on file  Food Insecurity: Not on file  Transportation Needs: Not on file  Physical Activity: Not on file  Stress: Not on file  Social Connections: Not on file   Family History  Problem Relation Age of Onset   Other Mother    Other Father    Pulmonary embolism Maternal Uncle     ASSESSMENT Recent Results: The most recent result is correlated with 45 mg per week: Lab Results  Component Value Date   INR 1.2 (A) 12/06/2022   INR 2.2 11/08/2022   INR 3.2 (A) 10/18/2022   PROTIME 20.4 (H) 03/16/2011    Anticoagulation Dosing: Description   Take one-and-one-half (1 & 1/2) tablets of your  peach-colored warfarin tablets by mouth, once-daily.      INR today: Subtherapeutic  PLAN Weekly dose was increased by 17% to 52.5 mg per week  Patient Instructions  Patient instructed to take medications as defined in the Anti-coagulation Track section of this encounter.  Patient instructed to take today's dose.  Patient instructed to take one-and-one-half (1 & 1/2) tablets of your  peach-colored warfarin tablets  by mouth, once-daily.  Patient verbalized understanding of these instructions.  Patient advised to contact clinic or seek medical attention if signs/symptoms of bleeding or thromboembolism occur.  Patient verbalized understanding by repeating back information and was advised to contact me if further medication-related questions arise. Patient was also provided an information handout.  Follow-up Return in 3 weeks (on 12/27/2022) for Follow up INR.  [name], PharmD, CPP  15 minutes spent face-to-face with the patient during the encounter. 50% of time spent on education, including signs/sx bleeding and clotting, as well as food and drug interactions with warfarin. 50% of time was spent on fingerprick POC INR sample  collection,processing, results determination, and documentation in [email or web address].

## 2022-12-06 NOTE — Patient Instructions (Signed)
Patient instructed to take medications as defined in the Anti-coagulation Track section of this encounter.  Patient instructed to take today's dose.  Patient instructed to take one-and-one-half (1 & 1/2) tablets of your 5mg peach-colored warfarin tablets by mouth, once-daily. Patient verbalized understanding of these instructions.   

## 2022-12-27 ENCOUNTER — Ambulatory Visit (INDEPENDENT_AMBULATORY_CARE_PROVIDER_SITE_OTHER): Payer: 59 | Admitting: Pharmacist

## 2022-12-27 ENCOUNTER — Other Ambulatory Visit: Payer: Self-pay | Admitting: Pharmacist

## 2022-12-27 DIAGNOSIS — I2699 Other pulmonary embolism without acute cor pulmonale: Secondary | ICD-10-CM | POA: Diagnosis not present

## 2022-12-27 DIAGNOSIS — Z7901 Long term (current) use of anticoagulants: Secondary | ICD-10-CM | POA: Diagnosis not present

## 2022-12-27 LAB — POCT INR: INR: 1.3 — AB (ref 2.0–3.0)

## 2022-12-27 NOTE — Telephone Encounter (Signed)
Refill authorization sent for new instructions relative to warfarin dosing:  Take 2 of the 5mg  peach-colored warfarin tablets on Mondays, Wednesdays and Fridays. All other days, take only one-half (1/2) tablet.

## 2022-12-27 NOTE — Patient Instructions (Signed)
Patient instructed to take medications as defined in the Anti-coagulation Track section of this encounter.  Patient instructed to take today's dose.  Patient instructed to take one-and-one-half (1 & 1/2) tablets of your 5mg  peach-colored warfarin tablets by mouth, once-daily on Sundays, Tuesdays, Thursdays and Saturdays. On Mondays, Wednesdays and Fridays, take two (2) tablets of your 5mg  peach-colored warfarin tablets by mouth, once-daily.  Patient verbalized understanding of these instructions.

## 2022-12-27 NOTE — Progress Notes (Signed)
Anticoagulation Management Richard [name] is a 47 y.o. male who reports to the clinic for monitoring of warfarin treatment.    Indication: PE , History of, with infarction, long term current use of oral anticoagulation with warfarin for target INR range 2.0 - 3.0 Duration: indefinite Supervising physician:  Reymundo Poll, MD  Anticoagulation Clinic Visit History: Patient does not report signs/symptoms of bleeding or thromboembolism  Other recent changes: No diet, medications, lifestyle changes per patient reporting. Anticoagulation Episode Summary     Current INR goal:  2.0-3.0  TTR:  56.8 % (10.7 y)  Next INR check:  01/10/2023  INR from last check:  1.3 (12/27/2022)  Weekly max warfarin dose:  45 mg  Target end date:  Indefinite  INR check location:  Anticoagulation Clinic  Preferred lab:    Send INR reminders to:     Indications   Pulmonary embolism and infarction ([facility]) [I26.99] D V T (Resolved) [I80.299] Long term (current) use of anticoagulants [Z79.01]        Comments:           No Known Allergies  Current Outpatient Medications:    warfarin (COUMADIN) 5 MG tablet, Take 1.5 tablets (7.5 mg total) by mouth daily at 4 PM., Disp: 48 tablet, Rfl: 2 Past Medical History:  Diagnosis Date   D V T 10/09/2010   Qualifier: Diagnosis of  By: [name] MD, Comer Locket      DVT (deep venous thrombosis) ([facility])    Seasonal allergies    Social History   Socioeconomic History   Marital status: Single    Spouse name: Not on file   Number of children: Not on file   Years of education: Not on file   Highest education level: Not on file  Occupational History   Not on file  Tobacco Use   Smoking status: Former    Years: 1    Types: Cigarettes, Cigars    Quit date: 04/05/2012    Years since quitting: 10.7   Smokeless tobacco: Never  Substance and Sexual Activity   Alcohol use: No    Alcohol/week: 0.0 standard drinks of alcohol   Drug use: No   Sexual activity: Not on file   Other Topics Concern   Not on file  Social History Narrative   Not on file   Social Determinants of Health   Financial Resource Strain: Not on file  Food Insecurity: Not on file  Transportation Needs: Not on file  Physical Activity: Not on file  Stress: Not on file  Social Connections: Not on file   Family History  Problem Relation Age of Onset   Other Mother    Other Father    Pulmonary embolism Maternal Uncle     ASSESSMENT Recent Results: The most recent result is correlated with 52.5 mg per week: Lab Results  Component Value Date   INR 1.3 (A) 12/27/2022   INR 1.2 (A) 12/06/2022   INR 2.2 11/08/2022   PROTIME 20.4 (H) 03/16/2011    Anticoagulation Dosing: Description   Take one-and-one-half (1 & 1/2) tablets of your 5mg  peach-colored warfarin tablets by mouth, once-daily on Sundays, Tuesdays, Thursdays and Saturdays. On Mondays, Wednesdays and Fridays, take two (2) tablets of your 5mg  peach-colored warfarin tablets by mouth, once-daily.      INR today: Subtherapeutic  PLAN Weekly dose was increased by 14% to 60 mg per week  Patient Instructions  Patient instructed to take medications as defined in the Anti-coagulation Track section of this encounter.  Patient instructed to take today's dose.  Patient instructed to take one-and-one-half (1 & 1/2) tablets of your 5mg  peach-colored warfarin tablets by mouth, once-daily on Sundays, Tuesdays, Thursdays and Saturdays. On Mondays, Wednesdays and Fridays, take two (2) tablets of your 5mg  peach-colored warfarin tablets by mouth, once-daily.  Patient verbalized understanding of these instructions.  Patient advised to contact clinic or seek medical attention if signs/symptoms of bleeding or thromboembolism occur.  Patient verbalized understanding by repeating back information and was advised to contact me if further medication-related questions arise. Patient was also provided an information handout.  Follow-up,  Return  in 2 weeks (on 01/10/2023) for Follow up INR.  [name], PharmD, CPP  15 minutes spent face-to-face with the patient during the encounter. 50% of time spent on education, including signs/sx bleeding and clotting, as well as food and drug interactions with warfarin. 50% of time was spent on fingerprick POC INR sample collection,processing, results determination, and documentation in [email or web address].

## 2023-01-10 ENCOUNTER — Ambulatory Visit (INDEPENDENT_AMBULATORY_CARE_PROVIDER_SITE_OTHER): Payer: 59 | Admitting: Pharmacist

## 2023-01-10 DIAGNOSIS — Z7901 Long term (current) use of anticoagulants: Secondary | ICD-10-CM

## 2023-01-10 DIAGNOSIS — I2699 Other pulmonary embolism without acute cor pulmonale: Secondary | ICD-10-CM | POA: Diagnosis not present

## 2023-01-10 LAB — POCT INR: INR: 2.8 (ref 2.0–3.0)

## 2023-01-10 NOTE — Progress Notes (Signed)
INTERNAL MEDICINE TEACHING ATTENDING ADDENDUM   I agree with these recommendations regarding anticoagulation management.   Aeron Donaghey, MD  

## 2023-01-10 NOTE — Progress Notes (Signed)
Anticoagulation Management Richard [name] is a 47 y.o. male who reports to the clinic for monitoring of warfarin treatment.    Indication: PE, with infarction; long term current use of oral anticoagulant, warfarin. INR target range 2.0 - 3.0.  Duration: indefinite Supervising physician:  Miguel Aschoff, MD  Anticoagulation Clinic Visit History: Patient does not report signs/symptoms of bleeding or thromboembolism  Other recent changes: No diet, medications, lifestyle changes cited by the patient. Anticoagulation Episode Summary     Current INR goal:  2.0-3.0  TTR:  56.7 % (10.7 y)  Next INR check:  02/07/2023  INR from last check:  2.8 (01/10/2023)  Weekly max warfarin dose:  45 mg  Target end date:  Indefinite  INR check location:  Anticoagulation Clinic  Preferred lab:    Send INR reminders to:     Indications   Pulmonary embolism and infarction ([facility]) [I26.99] D V T (Resolved) [I80.299] Long term (current) use of anticoagulants [Z79.01]        Comments:           No Known Allergies  Current Outpatient Medications:    warfarin (COUMADIN) 5 MG tablet, Take 1.5 tablets (7.5 mg total) by mouth daily at 4 PM., Disp: 48 tablet, Rfl: 2 Past Medical History:  Diagnosis Date   D V T 10/09/2010   Qualifier: Diagnosis of  By: [name] MD, Comer Locket      DVT (deep venous thrombosis) ([facility])    Seasonal allergies    Social History   Socioeconomic History   Marital status: Single    Spouse name: Not on file   Number of children: Not on file   Years of education: Not on file   Highest education level: Not on file  Occupational History   Not on file  Tobacco Use   Smoking status: Former    Years: 1    Types: Cigarettes, Cigars    Quit date: 04/05/2012    Years since quitting: 10.7   Smokeless tobacco: Never  Substance and Sexual Activity   Alcohol use: No    Alcohol/week: 0.0 standard drinks of alcohol   Drug use: No   Sexual activity: Not on file  Other Topics Concern    Not on file  Social History Narrative   Not on file   Social Determinants of Health   Financial Resource Strain: Not on file  Food Insecurity: Not on file  Transportation Needs: Not on file  Physical Activity: Not on file  Stress: Not on file  Social Connections: Not on file   Family History  Problem Relation Age of Onset   Other Mother    Other Father    Pulmonary embolism Maternal Uncle     ASSESSMENT Recent Results: The most recent result is correlated with 60 mg per week: Lab Results  Component Value Date   INR 2.8 01/10/2023   INR 1.3 (A) 12/27/2022   INR 1.2 (A) 12/06/2022   PROTIME 20.4 (H) 03/16/2011    Anticoagulation Dosing: Description   Take one-and-one-half (1 & 1/2) tablets of your 5mg  peach-colored warfarin tablets by mouth, once-daily on Sundays, Tuesdays, Thursdays and Saturdays. On Mondays, Wednesdays and Fridays, take two (2) tablets of your 5mg  peach-colored warfarin tablets by mouth, once-daily.      INR today: Therapeutic  PLAN Weekly dose was unchanged.  Patient Instructions  Patient instructed to take medications as defined in the Anti-coagulation Track section of this encounter.  Patient instructed to take today's dose.  Patient  instructed to take one-and-one-half (1 & 1/2) tablets of your 5mg  peach-colored warfarin tablets by mouth, once-daily on Sundays, Tuesdays, Thursdays and Saturdays. On Mondays, Wednesdays and Fridays, take two (2) tablets of your 5mg  peach-colored warfarin tablets by mouth, once-daily.  Patient verbalized understanding of these instructions.  Patient advised to contact clinic or seek medical attention if signs/symptoms of bleeding or thromboembolism occur.  Patient verbalized understanding by repeating back information and was advised to contact me if further medication-related questions arise. Patient was also provided an information handout.  Follow-up Return in 4 weeks (on 02/07/2023) for Follow up INR.  [name], PharmD, CPP  15 minutes spent face-to-face with the patient during the encounter. 50% of time spent on education, including signs/sx bleeding and clotting, as well as food and drug interactions with warfarin. 50% of time was spent on fingerprick POC INR sample collection,processing, results determination, and documentation in [email or web address].

## 2023-01-10 NOTE — Patient Instructions (Signed)
Patient instructed to take medications as defined in the Anti-coagulation Track section of this encounter.  Patient instructed to take today's dose.  Patient instructed to take one-and-one-half (1 & 1/2) tablets of your 5mg peach-colored warfarin tablets by mouth, once-daily on Sundays, Tuesdays, Thursdays and Saturdays. On Mondays, Wednesdays and Fridays, take two (2) tablets of your 5mg peach-colored warfarin tablets by mouth, once-daily.  Patient verbalized understanding of these instructions.  

## 2023-01-21 ENCOUNTER — Other Ambulatory Visit: Payer: Self-pay | Admitting: Pharmacist

## 2023-01-21 DIAGNOSIS — I2699 Other pulmonary embolism without acute cor pulmonale: Secondary | ICD-10-CM

## 2023-01-21 DIAGNOSIS — Z7901 Long term (current) use of anticoagulants: Secondary | ICD-10-CM

## 2023-01-21 MED ORDER — WARFARIN SODIUM 5 MG PO TABS: ORAL_TABLET | ORAL | 2 refills | Status: DC

## 2023-02-07 ENCOUNTER — Ambulatory Visit (INDEPENDENT_AMBULATORY_CARE_PROVIDER_SITE_OTHER): Payer: 59 | Admitting: Pharmacist

## 2023-02-07 DIAGNOSIS — Z7901 Long term (current) use of anticoagulants: Secondary | ICD-10-CM | POA: Diagnosis not present

## 2023-02-07 DIAGNOSIS — I2699 Other pulmonary embolism without acute cor pulmonale: Secondary | ICD-10-CM

## 2023-02-07 LAB — POCT INR: INR: 2.1 (ref 2.0–3.0)

## 2023-02-07 MED ORDER — WARFARIN SODIUM 5 MG PO TABS: ORAL_TABLET | ORAL | 2 refills | Status: DC

## 2023-02-07 NOTE — Progress Notes (Signed)
INTERNAL MEDICINE TEACHING ATTENDING ADDENDUM   I agree with pharmacy recommendations as outlined in their note.   Amyiah Gaba, MD  

## 2023-02-07 NOTE — Patient Instructions (Signed)
Patient instructed to take medications as defined in the Anti-coagulation Track section of this encounter.  Patient instructed to take today's dose.  Patient instructed to take two (2) of your 5 mg strength peach-colored warfarin tablets once daily at [facility] on Monday through Friday of each week. On Saturdays and Sundays, take only 1 & 1/2 tablets. Patient verbalized understanding of these instructions. Take

## 2023-02-07 NOTE — Progress Notes (Signed)
Anticoagulation Management Richard [name] is a 47 y.o. male who reports to the clinic for monitoring of warfarin treatment.    Indication: PE , history of, with infarction; long term current use of oral anticoagulant, warfarin with target INR range 2.0 - 3.0.  Duration: indefinite Supervising physician:  Reymundo Poll, MD  Anticoagulation Clinic Visit History: Patient does not report signs/symptoms of bleeding or thromboembolism  Other recent changes: No diet, medications, lifestyle changes endorsed by the patient at this visit.  Anticoagulation Episode Summary     Current INR goal:  2.0-3.0  TTR:  57.1 % (10.8 y)  Next INR check:  03/07/2023  INR from last check:  2.1 (02/07/2023)  Weekly max warfarin dose:  45 mg  Target end date:  Indefinite  INR check location:  Anticoagulation Clinic  Preferred lab:    Send INR reminders to:     Indications   Pulmonary embolism and infarction ([facility]) [I26.99] D V T (Resolved) [I80.299] Long term (current) use of anticoagulants [Z79.01]        Comments:           No Known Allergies  Current Outpatient Medications:    warfarin (COUMADIN) 5 MG tablet, Take 2 tablets on Monday through Friday of each week. On Saturdays and Sundays, take only 1 and 1/2 tablets., Disp: 52 tablet, Rfl: 2 Past Medical History:  Diagnosis Date   D V T 10/09/2010   Qualifier: Diagnosis of  By: [name] MD, Comer Locket.      DVT (deep venous thrombosis) ([facility])    Seasonal allergies    Social History   Socioeconomic History   Marital status: Single    Spouse name: Not on file   Number of children: Not on file   Years of education: Not on file   Highest education level: Not on file  Occupational History   Not on file  Tobacco Use   Smoking status: Former    Years: 1    Types: Cigarettes, Cigars    Quit date: 04/05/2012    Years since quitting: 10.8   Smokeless tobacco: Never  Substance and Sexual Activity   Alcohol use: No    Alcohol/week: 0.0 standard  drinks of alcohol   Drug use: No   Sexual activity: Not on file  Other Topics Concern   Not on file  Social History Narrative   Not on file   Social Determinants of Health   Financial Resource Strain: Not on file  Food Insecurity: Not on file  Transportation Needs: Not on file  Physical Activity: Not on file  Stress: Not on file  Social Connections: Not on file   Family History  Problem Relation Age of Onset   Other Mother    Other Father    Pulmonary embolism Maternal Uncle     ASSESSMENT Recent Results: The most recent result is correlated with 60 mg per week: Lab Results  Component Value Date   INR 2.1 02/07/2023   INR 2.8 01/10/2023   INR 1.3 (A) 12/27/2022   PROTIME 20.4 (H) 03/16/2011    Anticoagulation Dosing: Description   Take two (2) of your 5 mg strength peach-colored warfarin tablets once daily at [facility] on Monday through Friday of each week. On Saturdays and Sundays, take only 1 & 1/2 tablets.     INR today: Therapeutic  PLAN Weekly dose was increased by 8% to 65 mg per week  Patient Instructions  Patient instructed to take medications as defined in the Anti-coagulation Track  section of this encounter.  Patient instructed to take today's dose.  Patient instructed to take two (2) of your 5 mg strength peach-colored warfarin tablets once daily at [facility] on Monday through Friday of each week. On Saturdays and Sundays, take only 1 & 1/2 tablets. Patient verbalized understanding of these instructions. Take  Patient advised to contact clinic or seek medical attention if signs/symptoms of bleeding or thromboembolism occur.  Patient verbalized understanding by repeating back information and was advised to contact me if further medication-related questions arise. Patient was also provided an information handout.  Follow-up Return in 4 weeks (on 03/07/2023) for Follow up INR.  [name], PharmD, CPP  15 minutes spent face-to-face with the patient during the  encounter. 50% of time spent on education, including signs/sx bleeding and clotting, as well as food and drug interactions with warfarin. 50% of time was spent on fingerprick POC INR sample collection,processing, results determination, and documentation in [email or web address].

## 2023-03-07 ENCOUNTER — Ambulatory Visit (INDEPENDENT_AMBULATORY_CARE_PROVIDER_SITE_OTHER): Payer: 59 | Admitting: Pharmacist

## 2023-03-07 DIAGNOSIS — I2699 Other pulmonary embolism without acute cor pulmonale: Secondary | ICD-10-CM | POA: Diagnosis not present

## 2023-03-07 DIAGNOSIS — Z7901 Long term (current) use of anticoagulants: Secondary | ICD-10-CM

## 2023-03-07 LAB — POCT INR: POC INR: 2

## 2023-03-07 MED ORDER — WARFARIN SODIUM 5 MG PO TABS: 10.0000 mg | ORAL_TABLET | Freq: Every day | ORAL | 2 refills | Status: DC

## 2023-03-07 NOTE — Patient Instructions (Signed)
Patient instructed to take medications as defined in the Anti-coagulation Track section of this encounter.  Patient instructed to take today's dose.  Patient instructed to take two (2) of your 5 milligram strength, peach-colored warfarin tablets by mouth once-daily at 4PM. Patient verbalized understanding of these instructions.

## 2023-03-07 NOTE — Progress Notes (Signed)
Anticoagulation Management Richard [name] is a 47 y.o. male who reports to the clinic for monitoring of warfarin treatment.    Indication: PE with infarction, History of; Long term current use of oral anticoagulant, warfarin. Target INR range 2.0 - 3.0. Duration: indefinite Supervising physician: Debe Coder  Anticoagulation Clinic Visit History: Patient does not report signs/symptoms of bleeding or thromboembolism  Other recent changes: No diet, medications, lifestyle changes.  Anticoagulation Episode Summary     Current INR goal:  2.0-3.0  TTR:  57.4% (10.9 y)  Next INR check:  04/04/2023  INR from last check:  2.0 (03/07/2023)  Weekly max warfarin dose:  45 mg  Target end date:  Indefinite  INR check location:  Anticoagulation Clinic  Preferred lab:  --  Send INR reminders to:  --   Indications   Pulmonary embolism and infarction (HCC) [I26.99] D V T (Resolved) [I80.299] Long term (current) use of anticoagulants [Z79.01]        Comments:  --         No Known Allergies  Current Outpatient Medications:    warfarin (COUMADIN) 5 MG tablet, Take 2 tablets (10 mg total) by mouth daily at 4 PM., Disp: 56 tablet, Rfl: 2 Past Medical History:  Diagnosis Date   D V T 10/09/2010   Qualifier: Diagnosis of  By: [name] MD, Comer Locket.      DVT (deep venous thrombosis) (HCC)    Seasonal allergies    Social History   Socioeconomic History   Marital status: Single    Spouse name: Not on file   Number of children: Not on file   Years of education: Not on file   Highest education level: Not on file  Occupational History   Not on file  Tobacco Use   Smoking status: Former    Current packs/day: 0.00    Types: Cigarettes, Cigars    Start date: 04/06/2011    Quit date: 04/05/2012    Years since quitting: 10.9   Smokeless tobacco: Never  Substance and Sexual Activity   Alcohol use: No    Alcohol/week: 0.0 standard drinks of alcohol   Drug use: No   Sexual activity: Not on file   Other Topics Concern   Not on file  Social History Narrative   Not on file   Social Determinants of Health   Financial Resource Strain: Not on file  Food Insecurity: Not on file  Transportation Needs: Not on file  Physical Activity: Not on file  Stress: Not on file  Social Connections: Not on file   Family History  Problem Relation Age of Onset   Other Mother    Other Father    Pulmonary embolism Maternal Uncle     ASSESSMENT Recent Results: The most recent result is correlated with 65 mg per week: Lab Results  Component Value Date   INR 2.0 03/07/2023   INR 2.1 02/07/2023   INR 2.8 01/10/2023   PROTIME 20.4 (H) 03/16/2011    Anticoagulation Dosing: Description   Take two (2) of your 5 mg strength peach-colored warfarin tablets once daily at 6PM.     INR today: Therapeutic  PLAN Weekly dose was increased by 8% to 70 mg per week  Patient Instructions  Patient instructed to take medications as defined in the Anti-coagulation Track section of this encounter.  Patient instructed to take today's dose.  Patient instructed to take two (2) of your 5 milligram strength, peach-colored warfarin tablets by mouth once-daily at 4PM. Patient  verbalized understanding of these instructions.  Patient advised to contact clinic or seek medical attention if signs/symptoms of bleeding or thromboembolism occur.  Patient verbalized understanding by repeating back information and was advised to contact me if further medication-related questions arise. Patient was also provided an information handout.  Follow-up Return in 4 weeks (on 04/04/2023) for Follow up INR.  [name], PharmD, CPP  15 minutes spent face-to-face with the patient during the encounter. 50% of time spent on education, including signs/sx bleeding and clotting, as well as food and drug interactions with warfarin. 50% of time was spent on fingerprick POC INR sample collection,processing, results determination, and  documentation in [email or web address].

## 2023-04-04 ENCOUNTER — Ambulatory Visit (INDEPENDENT_AMBULATORY_CARE_PROVIDER_SITE_OTHER): Payer: 59 | Admitting: Pharmacist

## 2023-04-04 DIAGNOSIS — Z7901 Long term (current) use of anticoagulants: Secondary | ICD-10-CM | POA: Diagnosis not present

## 2023-04-04 DIAGNOSIS — I2699 Other pulmonary embolism without acute cor pulmonale: Secondary | ICD-10-CM

## 2023-04-04 LAB — POCT INR: INR: 2.7 (ref 2.0–3.0)

## 2023-04-04 MED ORDER — WARFARIN SODIUM 5 MG PO TABS: ORAL_TABLET | ORAL | 2 refills | Status: DC

## 2023-04-04 NOTE — Patient Instructions (Signed)
Patient instructed to take medications as defined in the Anti-coagulation Track section of this encounter.  Patient instructed to take today's dose.  Patient instructed to take two (2) of your 5 mg strength peach-colored warfarin tablets once daily at 6PM--EXCEPT on MONDAYS and THURSDAYS--take ONLY ONE (1) tablet on Mondays and Thursdays.  Patient verbalized understanding of these instructions.

## 2023-04-04 NOTE — Progress Notes (Signed)
Anticoagulation Management Richard [name] is a 47 y.o. male who reports to the clinic for monitoring of warfarin treatment.    Indication: PE with history of infarction. Long term current use of oral anticoagulant warfarin with target INR range 2.0 - 3.0.  Duration: indefinite Supervising physician: Debe Coder  Anticoagulation Clinic Visit History: Patient does not report signs/symptoms of bleeding or thromboembolism  Other recent changes: No diet, medications, lifestyle Anticoagulation Episode Summary     Current INR goal:  2.0-3.0  TTR:  57.7% (10.9 y)  Next INR check:  05/02/2023  INR from last check:  2.7 (04/04/2023)  Weekly max warfarin dose:  45 mg  Target end date:  Indefinite  INR check location:  Anticoagulation Clinic  Preferred lab:  --  Send INR reminders to:  --   Indications   Pulmonary embolism and infarction (HCC) [I26.99] D V T (Resolved) [I80.299] Long term (current) use of anticoagulants [Z79.01]        Comments:  --         No Known Allergies  Current Outpatient Medications:    warfarin (COUMADIN) 5 MG tablet, Take two (2) of your 5 mg peach-colored warfarin tablets all days of the week--EXCEPT on MONDAYS and THURSDAYS. Take only one (1) tablet on Mondays and Thursdays., Disp: 48 tablet, Rfl: 2 Past Medical History:  Diagnosis Date   D V T 10/09/2010   Qualifier: Diagnosis of  By: [name] MD, Comer Locket.      DVT (deep venous thrombosis) (HCC)    Seasonal allergies    Social History   Socioeconomic History   Marital status: Single    Spouse name: Not on file   Number of children: Not on file   Years of education: Not on file   Highest education level: Not on file  Occupational History   Not on file  Tobacco Use   Smoking status: Former    Current packs/day: 0.00    Types: Cigarettes, Cigars    Start date: 04/06/2011    Quit date: 04/05/2012    Years since quitting: 11.0   Smokeless tobacco: Never  Substance and Sexual Activity   Alcohol use:  No    Alcohol/week: 0.0 standard drinks of alcohol   Drug use: No   Sexual activity: Not on file  Other Topics Concern   Not on file  Social History Narrative   Not on file   Social Determinants of Health   Financial Resource Strain: Not on file  Food Insecurity: Not on file  Transportation Needs: Not on file  Physical Activity: Not on file  Stress: Not on file  Social Connections: Not on file   Family History  Problem Relation Age of Onset   Other Mother    Other Father    Pulmonary embolism Maternal Uncle     ASSESSMENT Recent Results: The most recent result is correlated with 70 mg per week: Lab Results  Component Value Date   INR 2.7 04/04/2023   INR 2.0 03/07/2023   INR 2.1 02/07/2023   PROTIME 20.4 (H) 03/16/2011    Anticoagulation Dosing: Description   Take two (2) of your 5 mg strength peach-colored warfarin tablets once daily at 6PM--EXCEPT on MONDAYS and THURSDAYS--take ONLY ONE (1) tablet on Mondays and Thursdays.      INR today: Therapeutic  PLAN Weekly dose was decreased by 14% to 60 mg per week  Patient Instructions  Patient instructed to take medications as defined in the Anti-coagulation Track section of this  encounter.  Patient instructed to take today's dose.  Patient instructed to take two (2) of your 5 mg strength peach-colored warfarin tablets once daily at 6PM--EXCEPT on MONDAYS and THURSDAYS--take ONLY ONE (1) tablet on Mondays and Thursdays.  Patient verbalized understanding of these instructions.  Patient advised to contact clinic or seek medical attention if signs/symptoms of bleeding or thromboembolism occur.  Patient verbalized understanding by repeating back information and was advised to contact me if further medication-related questions arise. Patient was also provided an information handout.  Follow-up Return in 4 weeks (on 05/02/2023) for Follow up INR.  [name], PharmD, CPP  15 minutes spent face-to-face with the  patient during the encounter. 50% of time spent on education, including signs/sx bleeding and clotting, as well as food and drug interactions with warfarin. 50% of time was spent on fingerprick POC INR sample collection,processing, results determination, and documentation in [email or web address].

## 2023-05-02 ENCOUNTER — Ambulatory Visit: Payer: 59 | Admitting: Pharmacist

## 2023-05-02 DIAGNOSIS — I2699 Other pulmonary embolism without acute cor pulmonale: Secondary | ICD-10-CM

## 2023-05-02 DIAGNOSIS — Z7901 Long term (current) use of anticoagulants: Secondary | ICD-10-CM

## 2023-05-02 LAB — POCT INR: INR: 2.3 (ref 2.0–3.0)

## 2023-05-02 MED ORDER — WARFARIN SODIUM 5 MG PO TABS
ORAL_TABLET | ORAL | 2 refills | Status: DC
Start: 2023-05-02 — End: 2023-05-30

## 2023-05-02 NOTE — Patient Instructions (Signed)
Patient instructed to take medications as defined in the Anti-coagulation Track section of this encounter.  Patient instructed to take today's dose.  Patient instructed to take two (2) of your 5 mg strength peach-colored warfarin tablets once daily at 6PM--EXCEPT on MONDAYS and THURSDAYS--take ONLY ONE (1) tablet on Mondays and Thursdays.  Patient verbalized understanding of these instructions.

## 2023-05-02 NOTE — Progress Notes (Signed)
Anticoagulation Management Richard [name] is a 47 y.o. male who reports to the clinic for monitoring of warfarin treatment.    Indication:  History of PE with infarction; long term current use of oral anticoagulant, warfarin. Target INR range 2.0 - 3.0.   Duration: indefinite Supervising physician: Carlynn Purl  Anticoagulation Clinic Visit History: Patient does not report signs/symptoms of bleeding or thromboembolism  Other recent changes: No diet, medications, lifestyle changes cited or identified.  Anticoagulation Episode Summary     Current INR goal:  2.0-3.0  TTR:  58.0% (11 y)  Next INR check:  05/30/2023  INR from last check:  2.3 (05/02/2023)  Weekly max warfarin dose:  45 mg  Target end date:  Indefinite  INR check location:  Anticoagulation Clinic  Preferred lab:  --  Send INR reminders to:  --   Indications   Pulmonary embolism and infarction (HCC) [I26.99] D V T (Resolved) [I80.299] Long term (current) use of anticoagulants [Z79.01]        Comments:  --         No Known Allergies  Current Outpatient Medications:    warfarin (COUMADIN) 5 MG tablet, Take two (2) of your 5 mg peach-colored warfarin tablets all days of the week--EXCEPT on MONDAYS and THURSDAYS. Take only one (1) tablet on Mondays and Thursdays., Disp: 48 tablet, Rfl: 2 Past Medical History:  Diagnosis Date   D V T 10/09/2010   Qualifier: Diagnosis of  By: [name] MD, Comer Locket.      DVT (deep venous thrombosis) (HCC)    Seasonal allergies    Social History   Socioeconomic History   Marital status: Single    Spouse name: Not on file   Number of children: Not on file   Years of education: Not on file   Highest education level: Not on file  Occupational History   Not on file  Tobacco Use   Smoking status: Former    Current packs/day: 0.00    Types: Cigarettes, Cigars    Start date: 04/06/2011    Quit date: 04/05/2012    Years since quitting: 11.0   Smokeless tobacco: Never  Substance and  Sexual Activity   Alcohol use: No    Alcohol/week: 0.0 standard drinks of alcohol   Drug use: No   Sexual activity: Not on file  Other Topics Concern   Not on file  Social History Narrative   Not on file   Social Determinants of Health   Financial Resource Strain: Not on file  Food Insecurity: Not on file  Transportation Needs: Not on file  Physical Activity: Not on file  Stress: Not on file  Social Connections: Not on file   Family History  Problem Relation Age of Onset   Other Mother    Other Father    Pulmonary embolism Maternal Uncle     ASSESSMENT Recent Results: The most recent result is correlated with 60 mg per week: Lab Results  Component Value Date   INR 2.3 05/02/2023   INR 2.7 04/04/2023   INR 2.0 03/07/2023   PROTIME 20.4 (H) 03/16/2011    Anticoagulation Dosing: Description   Take two (2) of your 5 mg strength peach-colored warfarin tablets once daily at 6PM--EXCEPT on MONDAYS and THURSDAYS--take ONLY ONE (1) tablet on Mondays and Thursdays.      INR today: Therapeutic  PLAN Weekly dose was unchanged. Continue to take 2x5 mg tablets of warfarin, all days of the week, except on Mondays and Thursdays, take  only 1x5 mg tablet of warfarin on these days for total of 60 mg warfarin per week.   Patient Instructions  Patient instructed to take medications as defined in the Anti-coagulation Track section of this encounter.  Patient instructed to take today's dose.  Patient instructed to take  two (2) of your 5 mg strength peach-colored warfarin tablets once daily at 6PM--EXCEPT on MONDAYS and THURSDAYS--take ONLY ONE (1) tablet on Mondays and Thursdays.  Patient verbalized understanding of these instructions.  Patient advised to contact clinic or seek medical attention if signs/symptoms of bleeding or thromboembolism occur.  Patient verbalized understanding by repeating back information and was advised to contact me if further medication-related questions  arise. Patient was also provided an information handout.  Follow-up Return in 4 weeks (on 05/30/2023) for Follow up INR.  [name], PharmD, CPP  15 minutes spent face-to-face with the patient during the encounter. 50% of time spent on education, including signs/sx bleeding and clotting, as well as food and drug interactions with warfarin. 50% of time was spent on fingerprick POC INR sample collection,processing, results determination, and documentation in [email or web address].

## 2023-05-30 ENCOUNTER — Ambulatory Visit: Payer: 59 | Admitting: Pharmacist

## 2023-05-30 DIAGNOSIS — I2699 Other pulmonary embolism without acute cor pulmonale: Secondary | ICD-10-CM

## 2023-05-30 DIAGNOSIS — Z7901 Long term (current) use of anticoagulants: Secondary | ICD-10-CM | POA: Diagnosis not present

## 2023-05-30 LAB — POCT INR: INR: 1.2 — AB (ref 2.0–3.0)

## 2023-05-30 MED ORDER — WARFARIN SODIUM 5 MG PO TABS
10.0000 mg | ORAL_TABLET | Freq: Every day | ORAL | 2 refills | Status: DC
Start: 2023-05-30 — End: 2023-06-27

## 2023-05-30 NOTE — Progress Notes (Signed)
Anticoagulation Management Richard [name] is a 47 y.o. male who reports to the clinic for monitoring of warfarin treatment.    Indication: PE with infarction, History of; Long term current use of oral anticoagulant, warfarin. Target INR range 2.0 - 3.0.  Duration: indefinite Supervising physician: Debe Coder  Anticoagulation Clinic Visit History: Patient does not report signs/symptoms of bleeding or thromboembolism  Other recent changes: No diet, medications, lifestyle changes endorsed to me at this visit by the patient.  Anticoagulation Episode Summary     Current INR goal:  2.0-3.0  TTR:  57.7% (11.1 y)  Next INR check:  06/27/2023  INR from last check:  1.2 (05/30/2023)  Weekly max warfarin dose:  45 mg  Target end date:  Indefinite  INR check location:  Anticoagulation Clinic  Preferred lab:  --  Send INR reminders to:  --   Indications   Pulmonary embolism and infarction (HCC) [I26.99] D V T (Resolved) [I80.299] Long term (current) use of anticoagulants [Z79.01]        Comments:  --         No Known Allergies  Current Outpatient Medications:    warfarin (COUMADIN) 5 MG tablet, Take two (2) of your 5 mg peach-colored warfarin tablets all days of the week--EXCEPT on MONDAYS and THURSDAYS. Take only one (1) tablet on Mondays and Thursdays., Disp: 48 tablet, Rfl: 2 Past Medical History:  Diagnosis Date   D V T 10/09/2010   Qualifier: Diagnosis of  By: [name] MD, Comer Locket.      DVT (deep venous thrombosis) (HCC)    Seasonal allergies    Social History   Socioeconomic History   Marital status: Single    Spouse name: Not on file   Number of children: Not on file   Years of education: Not on file   Highest education level: Not on file  Occupational History   Not on file  Tobacco Use   Smoking status: Former    Current packs/day: 0.00    Types: Cigarettes, Cigars    Start date: 04/06/2011    Quit date: 04/05/2012    Years since quitting: 11.1   Smokeless tobacco:  Never  Substance and Sexual Activity   Alcohol use: No    Alcohol/week: 0.0 standard drinks of alcohol   Drug use: No   Sexual activity: Not on file  Other Topics Concern   Not on file  Social History Narrative   Not on file   Social Determinants of Health   Financial Resource Strain: Not on file  Food Insecurity: Not on file  Transportation Needs: Not on file  Physical Activity: Not on file  Stress: Not on file  Social Connections: Not on file   Family History  Problem Relation Age of Onset   Other Mother    Other Father    Pulmonary embolism Maternal Uncle     ASSESSMENT Recent Results: The most recent result is correlated with 60 mg per week: Lab Results  Component Value Date   INR 1.2 (A) 05/30/2023   INR 2.3 05/02/2023   INR 2.7 04/04/2023   PROTIME 20.4 (H) 03/16/2011    Anticoagulation Dosing: Description   Take two (2) of your 5 mg strength peach-colored warfarin tablets once daily at 4PM.      INR today: Subtherapeutic  PLAN Weekly dose was increased by 16% to 70 mg per week  Patient Instructions  Patient instructed to take medications as defined in the Anti-coagulation Track section of this encounter.  Patient instructed to take today's dose.  Patient instructed to take two (2) of your 5 mg strength peach-colored warfarin tablets once daily at 4PM. Patient verbalized understanding of these instructions.  Patient advised to contact clinic or seek medical attention if signs/symptoms of bleeding or thromboembolism occur.  Patient verbalized understanding by repeating back information and was advised to contact me if further medication-related questions arise. Patient was also provided an information handout.  Follow-up Return in 4 weeks (on 06/27/2023) for Follow up INR.  [name], PharmD, CPP  15 minutes spent face-to-face with the patient during the encounter. 50% of time spent on education, including signs/sx bleeding and clotting, as well  as food and drug interactions with warfarin. 50% of time was spent on fingerprick POC INR sample collection,processing, results determination, and documentation in [email or web address].

## 2023-05-30 NOTE — Patient Instructions (Signed)
Patient instructed to take medications as defined in the Anti-coagulation Track section of this encounter.  Patient instructed to take today's dose.  Patient instructed to take two (2) of your 5 mg strength peach-colored warfarin tablets once daily at 4PM. Patient verbalized understanding of these instructions.

## 2023-06-02 NOTE — Progress Notes (Signed)
Evaluation and management procedures were performed by the Clinical Pharmacy Practitioner under my supervision and collaboration. I have reviewed the Practitioner's note and chart, and I agree with the management and plan as documented above. ° °

## 2023-06-27 ENCOUNTER — Ambulatory Visit: Payer: 59 | Admitting: Pharmacist

## 2023-06-27 DIAGNOSIS — Z7901 Long term (current) use of anticoagulants: Secondary | ICD-10-CM | POA: Diagnosis not present

## 2023-06-27 DIAGNOSIS — I2699 Other pulmonary embolism without acute cor pulmonale: Secondary | ICD-10-CM

## 2023-06-27 LAB — POCT INR: INR: 3 (ref 2.0–3.0)

## 2023-06-27 MED ORDER — WARFARIN SODIUM 5 MG PO TABS
ORAL_TABLET | ORAL | 2 refills | Status: DC
Start: 2023-06-27 — End: 2023-07-25

## 2023-06-27 NOTE — Patient Instructions (Signed)
Patient instructed to take medications as defined in the Anti-coagulation Track section of this encounter.  Patient instructed to take today's dose.  Patient instructed to take two (2) of your 5 mg strength peach-colored warfarin tablets once daily at 4PM--EXCEPT on TUESDAYS, take ONLY one-and-one-half (1 & 1/2) tablets on Tuesdays of each week.  Patient verbalized understanding of these instructions.

## 2023-06-27 NOTE — Progress Notes (Signed)
Anticoagulation Management Richard [name] is a 47 y.o. male who reports to the clinic for monitoring of warfarin treatment.    Indication: PE with infarction, History of; Long term current use of oral anticoagulation with warfarin to maintain INR range 2.0 - 3.0.  Duration: indefinite Supervising physician: Debe Coder  Anticoagulation Clinic Visit History: Patient does not report signs/symptoms of bleeding or thromboembolism  Other recent changes: No diet, medications, lifestyle changes.  Anticoagulation Episode Summary     Current INR goal:  2.0-3.0  TTR:  57.7% (11.2 y)  Next INR check:  07/25/2023  INR from last check:  3.0 (06/27/2023)  Weekly max warfarin dose:  45 mg  Target end date:  Indefinite  INR check location:  Anticoagulation Clinic  Preferred lab:  --  Send INR reminders to:  --   Indications   Pulmonary embolism and infarction (HCC) [I26.99] D V T (Resolved) [I80.299] Long term (current) use of anticoagulants [Z79.01]        Comments:  --         No Known Allergies  Current Outpatient Medications:    warfarin (COUMADIN) 5 MG tablet, Take 2 tablets (10 mg total) by mouth daily at 4 PM., Disp: 56 tablet, Rfl: 2 Past Medical History:  Diagnosis Date   D V T 10/09/2010   Qualifier: Diagnosis of  By: [name] MD, Comer Locket.      DVT (deep venous thrombosis) (HCC)    Seasonal allergies    Social History   Socioeconomic History   Marital status: Single    Spouse name: Not on file   Number of children: Not on file   Years of education: Not on file   Highest education level: Not on file  Occupational History   Not on file  Tobacco Use   Smoking status: Former    Current packs/day: 0.00    Types: Cigarettes, Cigars    Start date: 04/06/2011    Quit date: 04/05/2012    Years since quitting: 11.2   Smokeless tobacco: Never  Substance and Sexual Activity   Alcohol use: No    Alcohol/week: 0.0 standard drinks of alcohol   Drug use: No   Sexual activity:  Not on file  Other Topics Concern   Not on file  Social History Narrative   Not on file   Social Determinants of Health   Financial Resource Strain: Not on file  Food Insecurity: Not on file  Transportation Needs: Not on file  Physical Activity: Not on file  Stress: Not on file  Social Connections: Not on file   Family History  Problem Relation Age of Onset   Other Mother    Other Father    Pulmonary embolism Maternal Uncle     ASSESSMENT Recent Results: The most recent result is correlated with 70 mg per week: Lab Results  Component Value Date   INR 3.0 06/27/2023   INR 1.2 (A) 05/30/2023   INR 2.3 05/02/2023   PROTIME 20.4 (H) 03/16/2011    Anticoagulation Dosing: Description   Take two (2) of your 5 mg strength peach-colored warfarin tablets once daily at 4PM--EXCEPT on TUESDAYS, take ONLY one-and-one-half (1 & 1/2) tablets on Tuesdays of each week.      INR today: Therapeutic  PLAN Weekly dose was decreased by 4% to 67.5 mg per week  Patient Instructions  Patient instructed to take medications as defined in the Anti-coagulation Track section of this encounter.  Patient instructed to take today's dose.  Patient  instructed to take two (2) of your 5 mg strength peach-colored warfarin tablets once daily at 4PM--EXCEPT on TUESDAYS, take ONLY one-and-one-half (1 & 1/2) tablets on Tuesdays of each week.  Patient verbalized understanding of these instructions.  Patient advised to contact clinic or seek medical attention if signs/symptoms of bleeding or thromboembolism occur.  Patient verbalized understanding by repeating back information and was advised to contact me if further medication-related questions arise. Patient was also provided an information handout.  Follow-up Return in 4 weeks (on 07/25/2023) for Follow up INR.  [name], PharmD, CPP  15 minutes spent face-to-face with the patient during the encounter. 50% of time spent on education, including  signs/sx bleeding and clotting, as well as food and drug interactions with warfarin. 50% of time was spent on fingerprick POC INR sample collection,processing, results determination, and documentation in [email or web address].

## 2023-06-27 NOTE — Progress Notes (Signed)
Evaluation and management procedures were performed by the Clinical Pharmacy Practitioner under my supervision and collaboration. I have reviewed the Practitioner's note and chart, and I agree with the management and plan as documented above. ° °

## 2023-07-25 ENCOUNTER — Ambulatory Visit: Payer: 59 | Admitting: Pharmacist

## 2023-07-25 DIAGNOSIS — Z7901 Long term (current) use of anticoagulants: Secondary | ICD-10-CM

## 2023-07-25 DIAGNOSIS — I2699 Other pulmonary embolism without acute cor pulmonale: Secondary | ICD-10-CM | POA: Diagnosis not present

## 2023-07-25 LAB — POCT INR: INR: 4.2 — AB (ref 2.0–3.0)

## 2023-07-25 MED ORDER — WARFARIN SODIUM 5 MG PO TABS: ORAL_TABLET | ORAL | 2 refills | Status: DC

## 2023-07-25 NOTE — Progress Notes (Signed)
Evaluation and management procedures were performed by the Clinical Pharmacy Practitioner under my supervision and collaboration. I have reviewed the Practitioner's note and chart, and I agree with the management and plan as documented above.   Dickie La, MD

## 2023-07-25 NOTE — Patient Instructions (Signed)
Patient instructed to take medications as defined in the Anti-coagulation Track section of this encounter.  Patient instructed to take today's dose.  Patient instructed to take two (2) of your 5 mg strength peach-colored warfarin tablets once daily at 4PM--EXCEPT on TUESDAYS and FRIDAYS. Take ONLY one-and-one-half (1 & 1/2) tablets on Tuesdays and Fridays of each week.  Patient verbalized understanding of these instructions.

## 2023-07-25 NOTE — Progress Notes (Signed)
Anticoagulation Management Richard [name] is a 47 y.o. male who reports to the clinic for monitoring of warfarin treatment.    Indication: DVT , History of; Long term current use of oral anticoagulant warfarin with target INR range 2.0 - 3.0.  Duration: indefinite Supervising physician:  Dickie La, MD  Anticoagulation Clinic Visit History: Patient does not report signs/symptoms of bleeding or thromboembolism  Other recent changes: No diet, medications, lifestyle Anticoagulation Episode Summary     Current INR goal:  2.0-3.0  TTR:  57.3% (11.2 y)  Next INR check:  08/22/2023  INR from last check:  4.2 (07/25/2023)  Weekly max warfarin dose:  45 mg  Target end date:  Indefinite  INR check location:  Anticoagulation Clinic  Preferred lab:  --  Send INR reminders to:  --   Indications   Pulmonary embolism and infarction (HCC) [I26.99] D V T (Resolved) [I80.299] Long term (current) use of anticoagulants [Z79.01]        Comments:  --         No Known Allergies  Current Outpatient Medications:    warfarin (COUMADIN) 5 MG tablet, Take 2 tablets of your 5 mg strength peach colored tablets all days of the week--EXCEPT on TUESDAYS and FRIDAYS. Take ONLY 1 & 1/2 tablets on Tuesdays and Fridays., Disp: 52 tablet, Rfl: 2 Past Medical History:  Diagnosis Date   D V T 10/09/2010   Qualifier: Diagnosis of  By: [name] MD, Comer Locket.      DVT (deep venous thrombosis) (HCC)    Seasonal allergies    Social History   Socioeconomic History   Marital status: Single    Spouse name: Not on file   Number of children: Not on file   Years of education: Not on file   Highest education level: Not on file  Occupational History   Not on file  Tobacco Use   Smoking status: Former    Current packs/day: 0.00    Types: Cigarettes, Cigars    Start date: 04/06/2011    Quit date: 04/05/2012    Years since quitting: 11.3   Smokeless tobacco: Never  Substance and Sexual Activity   Alcohol use: No     Alcohol/week: 0.0 standard drinks of alcohol   Drug use: No   Sexual activity: Not on file  Other Topics Concern   Not on file  Social History Narrative   Not on file   Social Determinants of Health   Financial Resource Strain: Not on file  Food Insecurity: Not on file  Transportation Needs: Not on file  Physical Activity: Not on file  Stress: Not on file  Social Connections: Not on file   Family History  Problem Relation Age of Onset   Other Mother    Other Father    Pulmonary embolism Maternal Uncle     ASSESSMENT Recent Results: The most recent result is correlated with 67.5 mg per week: Lab Results  Component Value Date   INR 4.2 (A) 07/25/2023   INR 3.0 06/27/2023   INR 1.2 (A) 05/30/2023   PROTIME 20.4 (H) 03/16/2011    Anticoagulation Dosing: Description   Take two (2) of your 5 mg strength peach-colored warfarin tablets once daily at 4PM--EXCEPT on TUESDAYS and FRIDAYS. Take ONLY one-and-one-half (1 & 1/2) tablets on Tuesdays and Fridays of each week.      INR today: Supratherapeutic  PLAN Weekly dose was decreased by 4% to 65 mg per week  Patient Instructions  Patient instructed to  take medications as defined in the Anti-coagulation Track section of this encounter.  Patient instructed to take today's dose.  Patient instructed to take two (2) of your 5 mg strength peach-colored warfarin tablets once daily at 4PM--EXCEPT on TUESDAYS and FRIDAYS. Take ONLY one-and-one-half (1 & 1/2) tablets on Tuesdays and Fridays of each week.  Patient verbalized understanding of these instructions.  Patient advised to contact clinic or seek medical attention if signs/symptoms of bleeding or thromboembolism occur.  Patient verbalized understanding by repeating back information and was advised to contact me if further medication-related questions arise. Patient was also provided an information handout.  Follow-up Return in 4 weeks (on 08/22/2023) for Follow up  INR.  Richard [name], PharmD, CPP  15 minutes spent face-to-face with the patient during the encounter. 50% of time spent on education, including signs/sx bleeding and clotting, as well as food and drug interactions with warfarin. 50% of time was spent on fingerprick POC INR sample collection,processing, results determination, and documentation in [email or web address].

## 2023-08-22 ENCOUNTER — Ambulatory Visit (INDEPENDENT_AMBULATORY_CARE_PROVIDER_SITE_OTHER): Payer: 59 | Admitting: Pharmacist

## 2023-08-22 DIAGNOSIS — I2699 Other pulmonary embolism without acute cor pulmonale: Secondary | ICD-10-CM | POA: Diagnosis not present

## 2023-08-22 DIAGNOSIS — Z7901 Long term (current) use of anticoagulants: Secondary | ICD-10-CM

## 2023-08-22 LAB — POCT INR: INR: 2.7 (ref 2.0–3.0)

## 2023-08-22 MED ORDER — WARFARIN SODIUM 5 MG PO TABS
ORAL_TABLET | ORAL | 2 refills | Status: DC
Start: 2023-08-22 — End: 2023-09-19

## 2023-08-22 NOTE — Patient Instructions (Signed)
 Patient instructed to take medications as defined in the Anti-coagulation Track section of this encounter.  Patient instructed to take today's dose.  Patient instructed to take two (2) of your 5 mg strength peach-colored warfarin tablets once daily at 4PM--EXCEPT on TUESDAYS and FRIDAYS. Take ONLY one-and-one-half (1 & 1/2) tablets on Tuesdays and Fridays of each week.  Patient verbalized understanding of these instructions.

## 2023-08-22 NOTE — Progress Notes (Signed)
Anticoagulation Management Richard [name] is a 47 y.o. male who reports to the clinic for monitoring of warfarin treatment.    Indication: PE , History of; with infarction; Long term current use of oral anticoagulant, warfarin. Target INR range 2.0 - 3.0.  Duration: indefinite Supervising physician:  Mercie Eon, MD  Anticoagulation Clinic Visit History: Patient does not report signs/symptoms of bleeding or thromboembolism  Other recent changes: No diet, medications, lifestyle changes.  Anticoagulation Episode Summary     Current INR goal:  2.0-3.0  TTR:  57.1% (11.3 y)  Next INR check:  09/19/2023  INR from last check:  2.7 (08/22/2023)  Weekly max warfarin dose:  45 mg  Target end date:  Indefinite  INR check location:  Anticoagulation Clinic  Preferred lab:  --  Send INR reminders to:  --   Indications   Pulmonary embolism and infarction (HCC) [I26.99] D V T (Resolved) [I80.299] Long term (current) use of anticoagulants [Z79.01]        Comments:  --         No Known Allergies  Current Outpatient Medications:    warfarin (COUMADIN) 5 MG tablet, Take 2 tablets of your 5 mg strength peach colored tablets all days of the week--EXCEPT on TUESDAYS and FRIDAYS. Take ONLY 1 & 1/2 tablets on Tuesdays and Fridays., Disp: 52 tablet, Rfl: 2 Past Medical History:  Diagnosis Date   D V T 10/09/2010   Qualifier: Diagnosis of  By: [name] MD, Comer Locket.      DVT (deep venous thrombosis) (HCC)    Seasonal allergies    Social History   Socioeconomic History   Marital status: Single    Spouse name: Not on file   Number of children: Not on file   Years of education: Not on file   Highest education level: Not on file  Occupational History   Not on file  Tobacco Use   Smoking status: Former    Current packs/day: 0.00    Types: Cigarettes, Cigars    Start date: 04/06/2011    Quit date: 04/05/2012    Years since quitting: 11.3   Smokeless tobacco: Never  Substance and Sexual  Activity   Alcohol use: No    Alcohol/week: 0.0 standard drinks of alcohol   Drug use: No   Sexual activity: Not on file  Other Topics Concern   Not on file  Social History Narrative   Not on file   Social Drivers of Health   Financial Resource Strain: Not on file  Food Insecurity: Not on file  Transportation Needs: Not on file  Physical Activity: Not on file  Stress: Not on file  Social Connections: Not on file   Family History  Problem Relation Age of Onset   Other Mother    Other Father    Pulmonary embolism Maternal Uncle     ASSESSMENT Recent Results: The most recent result is correlated with 65 mg per week: Lab Results  Component Value Date   INR 2.7 08/22/2023   INR 4.2 (A) 07/25/2023   INR 3.0 06/27/2023   PROTIME 20.4 (H) 03/16/2011    Anticoagulation Dosing: Description   Take two (2) of your 5 mg strength peach-colored warfarin tablets once daily at 4PM--EXCEPT on TUESDAYS and FRIDAYS. Take ONLY one-and-one-half (1 & 1/2) tablets on Tuesdays and Fridays of each week.      INR today: Therapeutic  PLAN Weekly dose was unchanged. Continue to take two (2) of your 5 mg strength, peach-colored warfarin  tablets once-daily at 4PM--EXCEPT on TUESDAYS and FRIDAYS, take ONLY one-and-one-half (1 and 1/2) tablets on Tuesdays and Fridays.   Patient Instructions  Patient instructed to take medications as defined in the Anti-coagulation Track section of this encounter.  Patient instructed to take today's dose.  Patient instructed to take  two (2) of your 5 mg strength peach-colored warfarin tablets once daily at 4PM--EXCEPT on TUESDAYS and FRIDAYS. Take ONLY one-and-one-half (1 & 1/2) tablets on Tuesdays and Fridays of each week.  Patient verbalized understanding of these instructions.  Patient advised to contact clinic or seek medical attention if signs/symptoms of bleeding or thromboembolism occur.  Patient verbalized understanding by repeating back information and  was advised to contact me if further medication-related questions arise. Patient was also provided an information handout.  Follow-up Return in 4 weeks (on 09/19/2023) for Follow up INR.  [name], PharmD, CPP  15 minutes spent face-to-face with the patient during the encounter. 50% of time spent on education, including signs/sx bleeding and clotting, as well as food and drug interactions with warfarin. 50% of time was spent on fingerprick POC INR sample collection,processing, results determination, and documentation in [email or web address].

## 2023-08-22 NOTE — Progress Notes (Signed)
Evaluation and management procedures were performed by the Clinical Pharmacy Practitioner under my supervision and collaboration. I have reviewed the Practitioner's note and chart, and I agree with the management and plan as documented above. ° °

## 2023-09-19 ENCOUNTER — Ambulatory Visit (INDEPENDENT_AMBULATORY_CARE_PROVIDER_SITE_OTHER): Payer: 59

## 2023-09-19 DIAGNOSIS — I2699 Other pulmonary embolism without acute cor pulmonale: Secondary | ICD-10-CM | POA: Diagnosis not present

## 2023-09-19 DIAGNOSIS — Z7901 Long term (current) use of anticoagulants: Secondary | ICD-10-CM | POA: Diagnosis not present

## 2023-09-19 LAB — POCT INR: INR: 2.3 (ref 2.0–3.0)

## 2023-09-19 MED ORDER — WARFARIN SODIUM 5 MG PO TABS
ORAL_TABLET | ORAL | 3 refills | Status: DC
Start: 2023-09-19 — End: 2023-10-17

## 2023-09-19 NOTE — Progress Notes (Signed)
Anticoagulation Management Richard [name] is a 48 y.o. male who reports to the clinic for monitoring of warfarin treatment.    Indication: PE , History of-with infarction; long term current use of oral anticoagulant, warfarin. Target INR range 2.0 - 3.0.  Duration: indefinite Supervising physician:  Dickie La, MD  Anticoagulation Clinic Visit History: Patient does not report signs/symptoms of bleeding or thromboembolism  Other recent changes: No diet, medications, lifestyle changes identified by the patient at this visit.  Anticoagulation Episode Summary     Current INR goal:  2.0-3.0  TTR:  57.4% (11.4 y)  Next INR check:  10/17/2023  INR from last check:  2.3 (09/19/2023)  Weekly max warfarin dose:  45 mg  Target end date:  Indefinite  INR check location:  Anticoagulation Clinic  Preferred lab:  --  Send INR reminders to:  --   Indications   Pulmonary embolism and infarction (HCC) [I26.99] D V T (Resolved) [I80.299] Long term (current) use of anticoagulants [Z79.01]        Comments:  --         No Known Allergies  Current Outpatient Medications:    warfarin (COUMADIN) 5 MG tablet, Take 2 tablets of your 5 mg strength peach colored tablets all days of the week--EXCEPT on TUESDAYS and FRIDAYS. Take ONLY 1 & 1/2 tablets on Tuesdays and Fridays., Disp: 52 tablet, Rfl: 3 Past Medical History:  Diagnosis Date   D V T 10/09/2010   Qualifier: Diagnosis of  By: [name] MD, Comer Locket.      DVT (deep venous thrombosis) (HCC)    Seasonal allergies    Social History   Socioeconomic History   Marital status: Single    Spouse name: Not on file   Number of children: Not on file   Years of education: Not on file   Highest education level: Not on file  Occupational History   Not on file  Tobacco Use   Smoking status: Former    Current packs/day: 0.00    Types: Cigarettes, Cigars    Start date: 04/06/2011    Quit date: 04/05/2012    Years since quitting: 11.4   Smokeless tobacco:  Never  Substance and Sexual Activity   Alcohol use: No    Alcohol/week: 0.0 standard drinks of alcohol   Drug use: No   Sexual activity: Not on file  Other Topics Concern   Not on file  Social History Narrative   Not on file   Social Drivers of Health   Financial Resource Strain: Not on file  Food Insecurity: Not on file  Transportation Needs: Not on file  Physical Activity: Not on file  Stress: Not on file  Social Connections: Not on file   Family History  Problem Relation Age of Onset   Other Mother    Other Father    Pulmonary embolism Maternal Uncle     ASSESSMENT Recent Results: The most recent result is correlated with 65 mg per week: Lab Results  Component Value Date   INR 2.3 09/19/2023   INR 2.7 08/22/2023   INR 4.2 (A) 07/25/2023   PROTIME 20.4 (H) 03/16/2011    Anticoagulation Dosing: Description   Take two (2) of your 5 mg strength peach-colored warfarin tablets once daily at 4PM--EXCEPT on TUESDAYS and FRIDAYS. Take ONLY one-and-one-half (1 & 1/2) tablets on Tuesdays and Fridays of each week.      INR today: Therapeutic  PLAN Weekly dose was unchanged.Continue to take 2 of your 5  mg strength, peach-colored warfarin tablets on all days of the week, except on Tuesdays and Fridays, take only 1& 1/2 tablets on these days.   Patient Instructions  Patient instructed to take medications as defined in the Anti-coagulation Track section of this encounter.  Patient instructed to take today's dose.  Patient instructed to take  two (2) of your 5 mg strength peach-colored warfarin tablets once daily at 4PM--EXCEPT on TUESDAYS and FRIDAYS. Take ONLY one-and-one-half (1 & 1/2) tablets on Tuesdays and Fridays of each week.  Patient verbalized understanding of these instructions.  Patient advised to contact clinic or seek medical attention if signs/symptoms of bleeding or thromboembolism occur.  Patient verbalized understanding by repeating back information and was  advised to contact me if further medication-related questions arise. Patient was also provided an information handout.  Follow-up Return in 4 weeks (on 10/17/2023) for Follow up INR.  [name], PharmD, CPP  15 minutes spent face-to-face with the patient during the encounter. 50% of time spent on education, including signs/sx bleeding and clotting, as well as food and drug interactions with warfarin. 50% of time was spent on fingerprick POC INR sample collection,processing, results determination, and documentation in [email or web address].

## 2023-09-19 NOTE — Patient Instructions (Signed)
Patient instructed to take medications as defined in the Anti-coagulation Track section of this encounter.  Patient instructed to take today's dose.  Patient instructed to take  two (2) of your 5 mg strength peach-colored warfarin tablets once daily at 4PM--EXCEPT on TUESDAYS and FRIDAYS. Take ONLY one-and-one-half (1 & 1/2) tablets on Tuesdays and Fridays of each week.  Patient verbalized understanding of these instructions.

## 2023-09-19 NOTE — Progress Notes (Signed)
Evaluation and management procedures were performed by the Clinical Pharmacy Practitioner under my supervision and collaboration. I have reviewed the Practitioner's note and chart, and I agree with the management and plan as documented above.   Dickie La, MD

## 2023-10-17 ENCOUNTER — Ambulatory Visit (INDEPENDENT_AMBULATORY_CARE_PROVIDER_SITE_OTHER): Payer: 59 | Admitting: Pharmacist

## 2023-10-17 DIAGNOSIS — Z7901 Long term (current) use of anticoagulants: Secondary | ICD-10-CM

## 2023-10-17 DIAGNOSIS — I2699 Other pulmonary embolism without acute cor pulmonale: Secondary | ICD-10-CM

## 2023-10-17 LAB — POCT INR: INR: 2 (ref 2.0–3.0)

## 2023-10-17 MED ORDER — WARFARIN SODIUM 5 MG PO TABS: 10.0000 mg | ORAL_TABLET | Freq: Every day | ORAL | 3 refills | Status: DC

## 2023-10-17 NOTE — Progress Notes (Signed)
 INTERNAL MEDICINE TEACHING ATTENDING ADDENDUM   I agree with these recommendations regarding anticoagulation management.   Charissa Bash, MD

## 2023-10-17 NOTE — Progress Notes (Signed)
 Anticoagulation Management Richard [name] is a 48 y.o. male who reports to the clinic for monitoring of warfarin treatment.    Indication: PE , History of with infarction, long term current use of oral anticoagulant, warfarin, with target INR range 2.0 - 3.0.  Duration: indefinite Supervising physician:  Zenaida Deed. Mayford Knife, MD  Anticoagulation Clinic Visit History: Patient does not report signs/symptoms of bleeding or thromboembolism  Other recent changes: No diet, medications, lifestyle changes.  Anticoagulation Episode Summary     Current INR goal:  2.0-3.0  TTR:  57.7% (11.5 y)  Next INR check:  11/14/2023  INR from last check:  2.0 (10/17/2023)  Weekly max warfarin dose:  45 mg  Target end date:  Indefinite  INR check location:  Anticoagulation Clinic  Preferred lab:  --  Send INR reminders to:  --   Indications   Pulmonary embolism and infarction (HCC) [I26.99] D V T (Resolved) [I80.299] Long term (current) use of anticoagulants [Z79.01]        Comments:  --         No Known Allergies  Current Outpatient Medications:    warfarin (COUMADIN) 5 MG tablet, Take 2 tablets (10 mg total) by mouth daily at 4 PM., Disp: 62 tablet, Rfl: 3 Past Medical History:  Diagnosis Date   D V T 10/09/2010   Qualifier: Diagnosis of  By: [name] MD, Comer Locket.      DVT (deep venous thrombosis) (HCC)    Seasonal allergies    Social History   Socioeconomic History   Marital status: Single    Spouse name: Not on file   Number of children: Not on file   Years of education: Not on file   Highest education level: Not on file  Occupational History   Not on file  Tobacco Use   Smoking status: Former    Current packs/day: 0.00    Types: Cigarettes, Cigars    Start date: 04/06/2011    Quit date: 04/05/2012    Years since quitting: 11.5   Smokeless tobacco: Never  Substance and Sexual Activity   Alcohol use: No    Alcohol/week: 0.0 standard drinks of alcohol   Drug use: No   Sexual  activity: Not on file  Other Topics Concern   Not on file  Social History Narrative   Not on file   Social Drivers of Health   Financial Resource Strain: Not on file  Food Insecurity: Not on file  Transportation Needs: Not on file  Physical Activity: Not on file  Stress: Not on file  Social Connections: Not on file   Family History  Problem Relation Age of Onset   Other Mother    Other Father    Pulmonary embolism Maternal Uncle     ASSESSMENT Recent Results: The most recent result is correlated with 65 mg per week: Lab Results  Component Value Date   INR 2.0 10/17/2023   INR 2.3 09/19/2023   INR 2.7 08/22/2023   PROTIME 20.4 (H) 03/16/2011    Anticoagulation Dosing: Description   Take two (2) of your 5 mg strength peach-colored warfarin tablets once daily at 4PM.      INR today: Therapeutic  PLAN Weekly dose was increased by 8% to 70 mg per week  Patient Instructions  Patient instructed to take medications as defined in the Anti-coagulation Track section of this encounter.  Patient instructed to take today's dose.  Patient instructed to take  two (2) of your five-milligram, peach-colored warfarin tablets  by mouth, once-daily at 4PM each day.  Patient verbalized understanding of these instructions.  Patient advised to contact clinic or seek medical attention if signs/symptoms of bleeding or thromboembolism occur.  Patient verbalized understanding by repeating back information and was advised to contact me if further medication-related questions arise. Patient was also provided an information handout.  Follow-up Return in 4 weeks (on 11/14/2023) for Follow up INR.  [name], PharmD, CPP  15 minutes spent face-to-face with the patient during the encounter. 50% of time spent on education, including signs/sx bleeding and clotting, as well as food and drug interactions with warfarin. 50% of time was spent on fingerprick POC INR sample collection,processing,  results determination, and documentation in [email or web address].

## 2023-10-17 NOTE — Patient Instructions (Signed)
 Patient instructed to take medications as defined in the Anti-coagulation Track section of this encounter.  Patient instructed to take today's dose.  Patient instructed to take  two (2) of your five-milligram, peach-colored warfarin tablets by mouth, once-daily at 4PM each day.  Patient verbalized understanding of these instructions.

## 2023-11-14 ENCOUNTER — Ambulatory Visit (INDEPENDENT_AMBULATORY_CARE_PROVIDER_SITE_OTHER): Payer: 59 | Admitting: Pharmacist

## 2023-11-14 DIAGNOSIS — I2699 Other pulmonary embolism without acute cor pulmonale: Secondary | ICD-10-CM | POA: Diagnosis not present

## 2023-11-14 DIAGNOSIS — Z7901 Long term (current) use of anticoagulants: Secondary | ICD-10-CM | POA: Diagnosis not present

## 2023-11-14 LAB — POCT INR: INR: 1.1 — AB (ref 2.0–3.0)

## 2023-11-14 MED ORDER — WARFARIN SODIUM 5 MG PO TABS: 10.0000 mg | ORAL_TABLET | Freq: Every day | ORAL | 3 refills | Status: DC

## 2023-11-14 NOTE — Patient Instructions (Signed)
Patient instructed to take medications as defined in the Anti-coagulation Track section of this encounter.  Patient instructed to take today's dose.  Patient instructed to take two (2) of your 5 mg strength peach-colored warfarin tablets once daily at 4PM. Patient verbalized understanding of these instructions.

## 2023-11-14 NOTE — Progress Notes (Signed)
 Anticoagulation Management Richard [name] is a 48 y.o. male who reports to the clinic for monitoring of warfarin treatment.    Indication: PE, History of, Index event February 2012 with subsequent event; Labeled as a "life-long" anticoagulation patient with the second event. Oral anticoagulation Management with oral anticoagulation warfarin for INR range 2.0 - 3.0. Recent overnight hospitalization at [facility] 11/09/23 for "coughing up blood" the patient states. On his discharge instructions it is noted in the setting of supratherapeutic INR.  Duration: indefinite Supervising physician:  Zenaida Deed.Mayford Knife, MD  Anticoagulation Clinic Visit History: Patient does report signs/symptoms of bleeding or thromboembolism (Bleeding--as noted in patient findings, which-see.) Other recent changes: Yes:  diet, medications, lifestyle>>medication change: Pantoprazole 40 mg PO BID added by the admitting physician at [facility]. Anticoagulation Episode Summary     Current INR goal:  2.0-3.0  TTR:  57.3% (11.6 y)  Next INR check:  11/29/2023  INR from last check:  1.1 (11/14/2023)  Weekly max warfarin dose:  45 mg  Target end date:  Indefinite  INR check location:  Anticoagulation Clinic  Preferred lab:  --  Send INR reminders to:  --   Indications   Pulmonary embolism and infarction (HCC) [I26.99] D V T (Resolved) [I80.299] Long term (current) use of anticoagulants [Z79.01]        Comments:  --         No Known Allergies  Current Outpatient Medications:    pantoprazole (PROTONIX) 40 MG tablet, Take 40 mg by mouth 2 (two) times daily., Disp: , Rfl:    warfarin (COUMADIN) 5 MG tablet, Take 2 tablets (10 mg total) by mouth daily at 4 PM., Disp: 62 tablet, Rfl: 3 Past Medical History:  Diagnosis Date   D V T 10/09/2010   Qualifier: Diagnosis of  By: [name] MD, Comer Locket      DVT (deep venous thrombosis) (HCC)    Seasonal allergies    Social History   Socioeconomic History   Marital  status: Single    Spouse name: Not on file   Number of children: Not on file   Years of education: Not on file   Highest education level: Not on file  Occupational History   Not on file  Tobacco Use   Smoking status: Former    Current packs/day: 0.00    Types: Cigarettes, Cigars    Start date: 04/06/2011    Quit date: 04/05/2012    Years since quitting: 11.6   Smokeless tobacco: Never  Substance and Sexual Activity   Alcohol use: No    Alcohol/week: 0.0 standard drinks of alcohol   Drug use: No   Sexual activity: Not on file  Other Topics Concern   Not on file  Social History Narrative   Not on file   Social Drivers of Health   Financial Resource Strain: Not on file  Food Insecurity: Not on file  Transportation Needs: Not on file  Physical Activity: Not on file  Stress: Not on file  Social Connections: Not on file   Family History  Problem Relation Age of Onset   Other Mother    Other Father    Pulmonary embolism Maternal Uncle     ASSESSMENT Recent Results: The most recent result is correlated with 10 mg warfarin per day since discharge on 11/10/23 and pharmacologic reversal of anticoagulation on admission 11/09/23 for cited supratherapeutic INR with hematemesis.  Lab Results  Component Value Date   INR 1.1 (A) 11/14/2023   INR 2.0  10/17/2023   INR 2.3 09/19/2023   PROTIME 20.4 (H) 03/16/2011    Anticoagulation Dosing: Description   Take two (2) of your 5 mg strength peach-colored warfarin tablets once daily at 4PM.      INR today: Subtherapeutic after parenteral administration of vitamin K1 as a part of his admission at [facility] on 11/09/23>>11/10/23.   PLAN Weekly dose was unchanged, patient is to continue with his current dosing. Patient will be seen as requested by the discharging physician at [facility] for follow-up on 8-APR-25.   Patient Instructions  Patient instructed to take medications as defined in the Anti-coagulation Track  section of this encounter.  Patient instructed to take today's dose.  Patient instructed to take two (2) of your 5 mg strength peach-colored warfarin tablets once daily at 4PM.  Patient verbalized understanding of these instructions.  Patient advised to contact clinic or seek medical attention if signs/symptoms of bleeding or thromboembolism occur.  Patient verbalized understanding by repeating back information and was advised to contact me if further medication-related questions arise. Patient was also provided an information handout.  Follow-up Return in about 15 days (around 11/29/2023) for Follow up INR.  [name], PharmD, CPP  15 minutes spent face-to-face with the patient during the encounter. 50% of time spent on education, including signs/sx bleeding and clotting, as well as food and drug interactions with warfarin. 50% of time was spent on fingerprick POC INR sample collection,processing, results determination, and documentation in [email or web address].

## 2023-11-14 NOTE — Progress Notes (Signed)
 INTERNAL MEDICINE TEACHING ATTENDING ADDENDUM   I agree with these recommendations regarding anticoagulation management. I spoke briefly to Richard [name] together with Dr. Alexandria Lodge, confirming history that there were no other sites bleeding, that he didn't experience a nosebleed, and that he did not have hematemesis but rather gagged on hemoptysis blood.  He had not been ill with a cough, which arose spontaneously.  No significant smoking hx or weight loss.  Not clear what caused his supratherapeutic INR, no antibiotics and no dietary change.  F/u appt with his PCP arranged in a couple of weeks to discuss further. CXR apparently negative at [facility].   Charissa Bash, MD

## 2023-11-29 ENCOUNTER — Encounter: Payer: Self-pay | Admitting: Student

## 2023-11-29 ENCOUNTER — Ambulatory Visit (INDEPENDENT_AMBULATORY_CARE_PROVIDER_SITE_OTHER): Admitting: Student

## 2023-11-29 ENCOUNTER — Ambulatory Visit (INDEPENDENT_AMBULATORY_CARE_PROVIDER_SITE_OTHER): Admitting: Pharmacist

## 2023-11-29 VITALS — BP 127/81 | HR 79 | Temp 98.3°F | Ht 72.0 in | Wt 222.4 lb

## 2023-11-29 DIAGNOSIS — I2699 Other pulmonary embolism without acute cor pulmonale: Secondary | ICD-10-CM | POA: Diagnosis not present

## 2023-11-29 DIAGNOSIS — I10 Essential (primary) hypertension: Secondary | ICD-10-CM | POA: Diagnosis not present

## 2023-11-29 DIAGNOSIS — E785 Hyperlipidemia, unspecified: Secondary | ICD-10-CM | POA: Diagnosis not present

## 2023-11-29 DIAGNOSIS — Z7901 Long term (current) use of anticoagulants: Secondary | ICD-10-CM

## 2023-11-29 DIAGNOSIS — E7849 Other hyperlipidemia: Secondary | ICD-10-CM | POA: Diagnosis not present

## 2023-11-29 LAB — POCT INR: INR: 4 — AB (ref 2.0–3.0)

## 2023-11-29 NOTE — Progress Notes (Signed)
 Internal Medicine Clinic Attending  Case discussed with the resident at the time of the visit.  We reviewed the resident's history and exam and pertinent patient test results.  I agree with the assessment, diagnosis, and plan of care documented in the resident's note. Has not had basic labs in some time will obtain today along with risk factor assessment of lipid panel and A1C.

## 2023-11-29 NOTE — Assessment & Plan Note (Signed)
 Previous history of hyperlipidemia.  Patient had a lipid panel in 2022 showing elevated total cholesterol of 242, triglycerides 198, LDL 152.  He reports a history of diabetes in his family.  Plan: -Obtain A1c -Obtain lipid panel -Discussed lifestyle modifications with patient

## 2023-11-29 NOTE — Patient Instructions (Signed)
 Patient instructed to take medications as defined in the Anti-coagulation Track section of this encounter.  Patient instructed to take today's dose.  Patient instructed to take  two (2) of your 5 mg strength peach-colored warfarin tablets on Sundays, Mondays, Tuesdays, Thursdays and Fridays. On WEDNESDAYS and SATURDAYS, take ONLY one-and-one-half (1 & 1/2) of your 5 mg strength peach-colored warfarin tablets.  Patient verbalized understanding of these instructions.

## 2023-11-29 NOTE — Assessment & Plan Note (Signed)
 Patient continues to follow with Coumadin clinic for warfarin dosing.  He denies any abnormal bleeding.  He understands his dosing.  He has not had renal function checked in quite some time, so we will check this.  Plan: -Continue warfarin dosing per Coumadin clinic -Continue monitoring for bleeding -Follow-up CBC and BMP

## 2023-11-29 NOTE — Progress Notes (Signed)
   CC: Routine follow-up  HPI:  Richard [name] is a 48 y.o. male with a past medical history of hyperlipidemia, PE on chronic anticoagulation with warfarin who presents for follow-up appointment.  Please see assessment and plan for full HPI.   Past Medical History:  Diagnosis Date   D V T 10/09/2010   Qualifier: Diagnosis of  By: [name] MD, Comer Locket.      DVT (deep venous thrombosis) (HCC)    Seasonal allergies      Current Outpatient Medications:    pantoprazole (PROTONIX) 40 MG tablet, Take 40 mg by mouth 2 (two) times daily., Disp: , Rfl:    warfarin (COUMADIN) 5 MG tablet, Take 2 tablets (10 mg total) by mouth daily at 4 PM., Disp: 62 tablet, Rfl: 3  Review of Systems:   Negative except for what is stated in HPI  Physical Exam:  Vitals:   11/29/23 0938  BP: 127/81  Pulse: 79  Temp: 98.3 F (36.8 C)  TempSrc: Oral  SpO2: 97%  Weight: 222 lb 6.4 oz (100.9 kg)  Height: 6' (1.829 m)   General: Patient is sitting comfortably in the room  Cardio: Regular rate and rhythm, no murmurs, rubs or gallops. Pulmonary: Clear to ausculation bilaterally with no rales   Assessment & Plan:   Elevated blood pressure reading in office with diagnosis of hypertension Blood pressure measuring well today.  It is 127/81.  He denies any chest pain, shortness of breath, vision changes, or headaches.  Plan: -Continue to monitor blood pressure  Encounter for long-term (current) use of anticoagulants Patient continues to follow with Coumadin clinic for warfarin dosing.  He denies any abnormal bleeding.  He understands his dosing.  He has not had renal function checked in quite some time, so we will check this.  Plan: -Continue warfarin dosing per Coumadin clinic -Continue monitoring for bleeding -Follow-up CBC and BMP  Hyperlipidemia Previous history of hyperlipidemia.  Patient had a lipid panel in 2022 showing elevated total cholesterol of 242, triglycerides 198, LDL 152.  He reports  a history of diabetes in his family.  Plan: -Obtain A1c -Obtain lipid panel -Discussed lifestyle modifications with patient  Patient discussed with Dr.  Milagros Evener, DO PGY-2 Internal Medicine Resident

## 2023-11-29 NOTE — Assessment & Plan Note (Signed)
 Blood pressure measuring well today.  It is 127/81.  He denies any chest pain, shortness of breath, vision changes, or headaches.  Plan: -Continue to monitor blood pressure

## 2023-11-29 NOTE — Progress Notes (Signed)
 Anticoagulation Management Richard [name] is a 48 y.o. male who reports to the clinic for monitoring of warfarin treatment.    Indication: PE , History of; Long term current use of oral anticoagulant, warfarin. Target INR range 2.0 - 3.0.  Duration: indefinite Supervising physician:  Chana Bode, MD  Anticoagulation Clinic Visit History: Patient does not report signs/symptoms of bleeding or thromboembolism  Other recent changes: No diet, medications, lifestyle changes endorsed by the patient at this visit.  Anticoagulation Episode Summary     Current INR goal:  2.0-3.0  TTR:  57.2% (11.6 y)  Next INR check:  12/19/2023  INR from last check:  4.0 (11/29/2023)  Weekly max warfarin dose:  45 mg  Target end date:  Indefinite  INR check location:  Anticoagulation Clinic  Preferred lab:  --  Send INR reminders to:  --   Indications   Pulmonary embolism and infarction (HCC) [I26.99] D V T (Resolved) [I80.299] Long term (current) use of anticoagulants [Z79.01]        Comments:  --         No Known Allergies  Current Outpatient Medications:    pantoprazole (PROTONIX) 40 MG tablet, Take 40 mg by mouth 2 (two) times daily., Disp: , Rfl:    warfarin (COUMADIN) 5 MG tablet, Take 2 tablets (10 mg total) by mouth daily at 4 PM., Disp: 62 tablet, Rfl: 3 Past Medical History:  Diagnosis Date   D V T 10/09/2010   Qualifier: Diagnosis of  By: [name] MD, Comer Locket      DVT (deep venous thrombosis) (HCC)    Seasonal allergies    Social History   Socioeconomic History   Marital status: Single    Spouse name: Not on file   Number of children: Not on file   Years of education: Not on file   Highest education level: Not on file  Occupational History   Not on file  Tobacco Use   Smoking status: Former    Current packs/day: 0.00    Types: Cigarettes, Cigars    Start date: 04/06/2011    Quit date: 04/05/2012    Years since quitting: 11.6   Smokeless tobacco: Never  Substance and  Sexual Activity   Alcohol use: No    Alcohol/week: 0.0 standard drinks of alcohol   Drug use: No   Sexual activity: Not on file  Other Topics Concern   Not on file  Social History Narrative   Not on file   Social Drivers of Health   Financial Resource Strain: Not on file  Food Insecurity: Not on file  Transportation Needs: Not on file  Physical Activity: Not on file  Stress: Not on file  Social Connections: Not on file   Family History  Problem Relation Age of Onset   Other Mother    Other Father    Pulmonary embolism Maternal Uncle     ASSESSMENT Recent Results: The most recent result is correlated with 70 mg per week: Lab Results  Component Value Date   INR 4.0 (A) 11/29/2023   INR 1.1 (A) 11/14/2023   INR 2.0 10/17/2023   PROTIME 20.4 (H) 03/16/2011    Anticoagulation Dosing: Description   Take two (2) of your 5 mg strength peach-colored warfarin tablets on Sundays, Mondays, Tuesdays, Thursdays and Fridays. On WEDNESDAYS and SATURDAYS, take ONLY one-and-one-half (1 & 1/2) of your 5 mg strength peach-colored warfarin tablets.      INR today: Supratherapeutic  PLAN Weekly dose was decreased by  7% to 65 mg per week  Patient Instructions  Patient instructed to take medications as defined in the Anti-coagulation Track section of this encounter.  Patient instructed to take today's dose.  Patient instructed to take  two (2) of your 5 mg strength peach-colored warfarin tablets on Sundays, Mondays, Tuesdays, Thursdays and Fridays. On WEDNESDAYS and SATURDAYS, take ONLY one-and-one-half (1 & 1/2) of your 5 mg strength peach-colored warfarin tablets.  Patient verbalized understanding of these instructions.  Patient advised to contact clinic or seek medical attention if signs/symptoms of bleeding or thromboembolism occur.  Patient verbalized understanding by repeating back information and was advised to contact me if further medication-related questions arise. Patient was  also provided an information handout.  Follow-up Return in 20 days (on 12/19/2023) for Follow up INR.  [name], PharmD, CPP  15 minutes spent face-to-face with the patient during the encounter. 50% of time spent on education, including signs/sx bleeding and clotting, as well as food and drug interactions with warfarin. 50% of time was spent on fingerprick POC INR sample collection,processing, results determination, and documentation in [email or web address].

## 2023-11-29 NOTE — Patient Instructions (Addendum)
 Richard [name],Thank you for allowing me to take part in your care today.  Here are your instructions.  1. I am going to check your labs today. I will call you with the results.  2. Continue to follow up with Dr. Alexandria Lodge, come back in about 6 months for follow up on your elevated cholesterol   Thank you, Dr. Allena Katz  If you have any other questions please contact the internal medicine clinic at [phone] If it is after hours, please call the  hospital at [phone] and then ask the person who picks up for the resident on call.

## 2023-11-30 LAB — LIPID PANEL: Chol/HDL Ratio: 6.1 ratio — ABNORMAL HIGH (ref 0.0–5.0)

## 2023-12-02 LAB — CBC WITH DIFFERENTIAL/PLATELET
Basophils Absolute: 0.1 10*3/uL (ref 0.0–0.2)
Basos: 2 %
EOS (ABSOLUTE): 0.5 10*3/uL — ABNORMAL HIGH (ref 0.0–0.4)
Eos: 7 %
Hematocrit: 45.9 % (ref 37.5–51.0)
Hemoglobin: 14.9 g/dL (ref 13.0–17.7)
Immature Grans (Abs): 0 10*3/uL (ref 0.0–0.1)
Immature Granulocytes: 0 %
Lymphocytes Absolute: 2.2 10*3/uL (ref 0.7–3.1)
Lymphs: 33 %
MCH: 30.3 pg (ref 26.6–33.0)
MCHC: 32.5 g/dL (ref 31.5–35.7)
MCV: 94 fL (ref 79–97)
Monocytes Absolute: 0.6 10*3/uL (ref 0.1–0.9)
Monocytes: 8 %
Neutrophils Absolute: 3.4 10*3/uL (ref 1.4–7.0)
Neutrophils: 50 %
Platelets: 283 10*3/uL (ref 150–450)
RBC: 4.91 x10E6/uL (ref 4.14–5.80)
RDW: 13.7 % (ref 11.6–15.4)
WBC: 6.8 10*3/uL (ref 3.4–10.8)

## 2023-12-02 LAB — HEMOGLOBIN A1C
Est. average glucose Bld gHb Est-mCnc: 123 mg/dL
Hgb A1c MFr Bld: 5.9 % — ABNORMAL HIGH (ref 4.8–5.6)

## 2023-12-02 LAB — LIPID PANEL
Cholesterol, Total: 252 mg/dL — ABNORMAL HIGH (ref 100–199)
HDL: 41 mg/dL (ref >39)
LDL CALC COMMENT:: 6.1 ratio — ABNORMAL HIGH (ref 0.0–5.0)
LDL Chol Calc (NIH): 154 mg/dL — ABNORMAL HIGH (ref 0–99)
Triglycerides: 304 mg/dL — ABNORMAL HIGH (ref 0–149)
VLDL Cholesterol Cal: 57 mg/dL — ABNORMAL HIGH (ref 5–40)

## 2023-12-02 LAB — BMP8+ANION GAP
Anion Gap: 16 mmol/L (ref 10.0–18.0)
BUN/Creatinine Ratio: 14 (ref 9–20)
BUN: 18 mg/dL (ref 6–24)
CO2: 21 mmol/L (ref 20–29)
Calcium: 9.6 mg/dL (ref 8.7–10.2)
Chloride: 103 mmol/L (ref 96–106)
Creatinine, Ser: 1.27 mg/dL (ref 0.76–1.27)
Glucose: 100 mg/dL — ABNORMAL HIGH (ref 70–99)
Potassium: 4.4 mmol/L (ref 3.5–5.2)
Sodium: 140 mmol/L (ref 134–144)
eGFR: 70 mL/min/{1.73_m2} (ref >59)

## 2023-12-19 ENCOUNTER — Ambulatory Visit (INDEPENDENT_AMBULATORY_CARE_PROVIDER_SITE_OTHER): Admitting: Pharmacist

## 2023-12-19 DIAGNOSIS — I2699 Other pulmonary embolism without acute cor pulmonale: Secondary | ICD-10-CM

## 2023-12-19 DIAGNOSIS — Z7901 Long term (current) use of anticoagulants: Secondary | ICD-10-CM | POA: Diagnosis not present

## 2023-12-19 LAB — POCT INR: INR: 2.2 (ref 2.0–3.0)

## 2023-12-19 MED ORDER — WARFARIN SODIUM 5 MG PO TABS: ORAL_TABLET | ORAL | 3 refills | Status: DC

## 2023-12-19 NOTE — Progress Notes (Signed)
 Anticoagulation Management Richard [name] is a 48 y.o. male who reports to the clinic for monitoring of warfarin treatment.    Indication: PE , History of, with infarction; long term current use of oral anticoagulant, warfarin. Target INR range 2.0 - 3.0.  Duration: indefinite Supervising physician:  Driscilla George, MD  Anticoagulation Clinic Visit History: Patient does not report signs/symptoms of bleeding or thromboembolism  Other recent changes: No diet, medications, lifestyle changes.  Anticoagulation Episode Summary     Current INR goal:  2.0-3.0  TTR:  57.1% (11.7 y)  Next INR check:  01/09/2024  INR from last check:  2.2 (12/19/2023)  Weekly max warfarin dose:  45 mg  Target end date:  Indefinite  INR check location:  Anticoagulation Clinic  Preferred lab:  --  Send INR reminders to:  --   Indications   Pulmonary embolism and infarction (HCC) [I26.99] D V T (Resolved) [I80.299] Long term (current) use of anticoagulants [Z79.01]        Comments:  --         No Known Allergies  Current Outpatient Medications:    pantoprazole (PROTONIX) 40 MG tablet, Take 40 mg by mouth 2 (two) times daily., Disp: , Rfl:    warfarin (COUMADIN ) 5 MG tablet, Take two (2) tablets of your peach-colored 5 mg warfarin tablets all days of the week--EXCEPT on WEDNESDAYS and SATURDAYS--take only one-and-one-half (1 & 1/2) tablets., Disp: 62 tablet, Rfl: 3 Past Medical History:  Diagnosis Date   D V T 10/09/2010   Qualifier: Diagnosis of  By: Villa Greaser MD, Jennifer Moellers.      DVT (deep venous thrombosis) (HCC)    Seasonal allergies    Social History   Socioeconomic History   Marital status: Single    Spouse name: Not on file   Number of children: Not on file   Years of education: Not on file   Highest education level: Not on file  Occupational History   Not on file  Tobacco Use   Smoking status: Former    Current packs/day: 0.00    Types: Cigarettes, Cigars    Start date: 04/06/2011    Quit  date: 04/05/2012    Years since quitting: 11.7   Smokeless tobacco: Never  Substance and Sexual Activity   Alcohol use: No    Alcohol/week: 0.0 standard drinks of alcohol   Drug use: No   Sexual activity: Not on file  Other Topics Concern   Not on file  Social History Narrative   Not on file   Social Drivers of Health   Financial Resource Strain: Not on file  Food Insecurity: Not on file  Transportation Needs: Not on file  Physical Activity: Not on file  Stress: Not on file  Social Connections: Not on file   Family History  Problem Relation Age of Onset   Other Mother    Other Father    Pulmonary embolism Maternal Uncle     ASSESSMENT Recent Results: The most recent result is correlated with 65 mg per week: Lab Results  Component Value Date   INR 2.2 12/19/2023   INR 4.0 (A) 11/29/2023   INR 1.1 (A) 11/14/2023   PROTIME 20.4 (H) 03/16/2011    Anticoagulation Dosing: Description   Take two (2) of your 5 mg strength peach-colored warfarin tablets on Sundays, Mondays, Tuesdays, Thursdays and Fridays. On WEDNESDAYS and SATURDAYS, take ONLY one-and-one-half (1 & 1/2) of your 5 mg strength peach-colored warfarin tablets.      INR  today: Therapeutic  PLAN Weekly dose was unchanged. Continue 65 mg warfarin per week.   Patient Instructions  Patient instructed to take medications as defined in the Anti-coagulation Track section of this encounter.  Patient instructed to take today's dose.  Patient instructed to take two (2) of your 5 mg strength peach-colored warfarin tablets on Sundays, Mondays, Tuesdays, Thursdays and Fridays. On WEDNESDAYS and SATURDAYS, take ONLY one-and-one-half (1 & 1/2) of your 5 mg strength peach-colored warfarin tablets.  Patient verbalized understanding of these instructions.  Patient advised to contact clinic or seek medical attention if signs/symptoms of bleeding or thromboembolism occur.  Patient verbalized understanding by repeating back  information and was advised to contact me if further medication-related questions arise. Patient was also provided an information handout.  Follow-up Return in 3 weeks (on 01/09/2024) for Follow up INR.  Kenda Paula, PharmD, CPP Clinical Pharmacist Practitioner   15 minutes spent face-to-face with the patient during the encounter. 50% of time spent on education, including signs/sx bleeding and clotting, as well as food and drug interactions with warfarin. 50% of time was spent on fingerprick POC INR sample collection,processing, results determination, and documentation in [email or web address].

## 2023-12-19 NOTE — Patient Instructions (Signed)
 Patient instructed to take medications as defined in the Anti-coagulation Track section of this encounter.  Patient instructed to take today's dose.  Patient instructed to take  two (2) of your 5 mg strength peach-colored warfarin tablets on Sundays, Mondays, Tuesdays, Thursdays and Fridays. On WEDNESDAYS and SATURDAYS, take ONLY one-and-one-half (1 & 1/2) of your 5 mg strength peach-colored warfarin tablets.  Patient verbalized understanding of these instructions.

## 2024-01-09 ENCOUNTER — Ambulatory Visit: Admitting: Pharmacist

## 2024-01-09 DIAGNOSIS — Z7901 Long term (current) use of anticoagulants: Secondary | ICD-10-CM

## 2024-01-09 DIAGNOSIS — I2699 Other pulmonary embolism without acute cor pulmonale: Secondary | ICD-10-CM | POA: Diagnosis not present

## 2024-01-09 LAB — POCT INR: INR: 1.9 — AB (ref 2.0–3.0)

## 2024-01-09 MED ORDER — WARFARIN SODIUM 5 MG PO TABS: 10.0000 mg | ORAL_TABLET | Freq: Every day | ORAL | 3 refills | Status: DC

## 2024-01-09 NOTE — Progress Notes (Signed)
 Anticoagulation Management Richard [name] is a 48 y.o. male who reports to the clinic for monitoring of warfarin treatment.    Indication: PE , History of, with infarction; long term current use of oral anticoagulant warfarin. Target INR range 2.0 - 3.0.  Duration: indefinite Supervising physician: Kirt Pereyra, MD  Anticoagulation Clinic Visit History: Patient does not report signs/symptoms of bleeding or thromboembolism  Other recent changes: No diet, medications, lifestyle changes endorsed by the patient at this visit.  Anticoagulation Episode Summary     Current INR goal:  2.0-3.0  TTR:  57.2% (11.7 y)  Next INR check:  02/06/2024  INR from last check:  1.9 (01/09/2024)  Weekly max warfarin dose:  45 mg  Target end date:  Indefinite  INR check location:  Anticoagulation Clinic  Preferred lab:  --  Send INR reminders to:  --   Indications   Pulmonary embolism and infarction (HCC) [I26.99] D V T (Resolved) [I80.299] Long term (current) use of anticoagulants [Z79.01]        Comments:  --         No Known Allergies  Current Outpatient Medications:    pantoprazole (PROTONIX) 40 MG tablet, Take 40 mg by mouth 2 (two) times daily., Disp: , Rfl:    warfarin (COUMADIN ) 5 MG tablet, Take 2 tablets (10 mg total) by mouth daily at 4 PM., Disp: 62 tablet, Rfl: 3 Past Medical History:  Diagnosis Date   D V T 10/09/2010   Qualifier: Diagnosis of  By: Villa Greaser MD, Jennifer Moellers      DVT (deep venous thrombosis) (HCC)    Seasonal allergies    Social History   Socioeconomic History   Marital status: Single    Spouse name: Not on file   Number of children: Not on file   Years of education: Not on file   Highest education level: Not on file  Occupational History   Not on file  Tobacco Use   Smoking status: Former    Current packs/day: 0.00    Types: Cigarettes, Cigars    Start date: 04/06/2011    Quit date: 04/05/2012    Years since quitting: 11.7   Smokeless tobacco: Never   Substance and Sexual Activity   Alcohol use: No    Alcohol/week: 0.0 standard drinks of alcohol   Drug use: No   Sexual activity: Not on file  Other Topics Concern   Not on file  Social History Narrative   Not on file   Social Drivers of Health   Financial Resource Strain: Not on file  Food Insecurity: Not on file  Transportation Needs: Not on file  Physical Activity: Not on file  Stress: Not on file  Social Connections: Not on file   Family History  Problem Relation Age of Onset   Other Mother    Other Father    Pulmonary embolism Maternal Uncle     ASSESSMENT Recent Results: The most recent result is correlated with 65 mg per week: Lab Results  Component Value Date   INR 1.9 (A) 01/09/2024   INR 2.2 12/19/2023   INR 4.0 (A) 11/29/2023   PROTIME 20.4 (H) 03/16/2011    Anticoagulation Dosing: Description   Take two (2) of your 5 mg strength peach-colored warfarin tablets by mouth, once-daily.      INR today: Subtherapeutic  PLAN Weekly dose was increased by 8% to 70 mg per week  Patient Instructions  Patient instructed to take medications as defined in the Anti-coagulation Track  section of this encounter.  Patient instructed to take today's dose.  Patient instructed to take two (2) tablets of your peach-colored, 5 mg strength warfarin tablets by mouth, once-daily.  Patient verbalized understanding of these instructions.  Patient advised to contact clinic or seek medical attention if signs/symptoms of bleeding or thromboembolism occur.  Patient verbalized understanding by repeating back information and was advised to contact me if further medication-related questions arise. Patient was also provided an information handout.  Follow-up Return in 4 weeks (on 02/06/2024) for Follow up INR.  Kenda Paula, PharmD, CPP Clinical Pharmacist Practitioner  15 minutes spent face-to-face with the patient during the encounter. 50% of time spent on education, including  signs/sx bleeding and clotting, as well as food and drug interactions with warfarin. 50% of time was spent on fingerprick POC INR sample collection,processing, results determination, and documentation in [email or web address].

## 2024-01-09 NOTE — Patient Instructions (Signed)
 Patient instructed to take medications as defined in the Anti-coagulation Track section of this encounter.  Patient instructed to take today's dose.  Patient instructed to take two (2) tablets of your peach-colored, 5 mg strength warfarin tablets by mouth, once-daily.  Patient verbalized understanding of these instructions.

## 2024-01-09 NOTE — Progress Notes (Signed)
 Internal Medicine Clinic Attending  Evaluation and management procedures were performed by the Clinical Pharmacy Practitioner under my supervision and collaboration. I have reviewed the Practitioner's note and chart, and I agree with the management and plan as documented above.

## 2024-02-06 ENCOUNTER — Ambulatory Visit (INDEPENDENT_AMBULATORY_CARE_PROVIDER_SITE_OTHER): Admitting: Pharmacist

## 2024-02-06 DIAGNOSIS — Z7901 Long term (current) use of anticoagulants: Secondary | ICD-10-CM | POA: Diagnosis not present

## 2024-02-06 DIAGNOSIS — I2699 Other pulmonary embolism without acute cor pulmonale: Secondary | ICD-10-CM | POA: Diagnosis not present

## 2024-02-06 LAB — POCT INR: INR: 3.1 — AB (ref 2.0–3.0)

## 2024-02-06 MED ORDER — WARFARIN SODIUM 5 MG PO TABS: ORAL_TABLET | ORAL | 3 refills | Status: DC

## 2024-02-06 NOTE — Patient Instructions (Signed)
 Patient instructed to take medications as defined in the Anti-coagulation Track section of this encounter.  Patient instructed to take today's dose.  Patient instructed to take  two (2) of your 5 mg strength peach-colored warfarin tablets by mouth, once-daily on all days of the week--EXCEPT on TUESDAYS and THURSDAYS, take only one-and-one-half (1 & 1/2) tablets on Tuesdays and Thursdays.  Patient verbalized understanding of these instructions.

## 2024-02-06 NOTE — Progress Notes (Signed)
 Anticoagulation Management Richard [name] is a 48 y.o. male who reports to the clinic for monitoring of warfarin treatment.    Indication: PE, History of with infarction; Long term current use of oral anticoagulation with warfarin with target INR range 2.0 - 3.0 Duration: indefinite Supervising physician: Carolyn Cisco. Winfree, MD  Anticoagulation Clinic Visit History: Patient does not report signs/symptoms of bleeding or thromboembolism  Other recent changes: No diet, medications, lifestyle changes cited by the patient at this visit.  Anticoagulation Episode Summary     Current INR goal:  2.0-3.0  TTR:  57.3% (11.8 y)  Next INR check:  03/05/2024  INR from last check:  3.1 (02/06/2024)  Weekly max warfarin dose:  45 mg  Target end date:  Indefinite  INR check location:  Anticoagulation Clinic  Preferred lab:  --  Send INR reminders to:  --   Indications   Pulmonary embolism and infarction (HCC) [I26.99] D V T (Resolved) [I80.299] Long term (current) use of anticoagulants [Z79.01]        Comments:  --         No Known Allergies  Current Outpatient Medications:    pantoprazole (PROTONIX) 40 MG tablet, Take 40 mg by mouth 2 (two) times daily. (Patient not taking: Reported on 02/06/2024), Disp: , Rfl:    warfarin (COUMADIN ) 5 MG tablet, Take two (2) of your 10 mg white colored warfarin tablets on all days of the week--EXCEPT on TUESDAYS and THURSDAYS, take ONLY one-and-one-half (1 & 1/2) tablets on Tuesdays and Thursdays., Disp: 52 tablet, Rfl: 3 Past Medical History:  Diagnosis Date   D V T 10/09/2010   Qualifier: Diagnosis of  By: Villa Greaser MD, Jennifer Moellers.      DVT (deep venous thrombosis) (HCC)    Seasonal allergies    Social History   Socioeconomic History   Marital status: Single    Spouse name: Not on file   Number of children: Not on file   Years of education: Not on file   Highest education level: Not on file  Occupational History   Not on file  Tobacco Use   Smoking  status: Former    Current packs/day: 0.00    Types: Cigarettes, Cigars    Start date: 04/06/2011    Quit date: 04/05/2012    Years since quitting: 11.8   Smokeless tobacco: Never  Substance and Sexual Activity   Alcohol use: No    Alcohol/week: 0.0 standard drinks of alcohol   Drug use: No   Sexual activity: Not on file  Other Topics Concern   Not on file  Social History Narrative   Not on file   Social Drivers of Health   Financial Resource Strain: Not on file  Food Insecurity: Not on file  Transportation Needs: Not on file  Physical Activity: Not on file  Stress: Not on file  Social Connections: Not on file   Family History  Problem Relation Age of Onset   Other Mother    Other Father    Pulmonary embolism Maternal Uncle     ASSESSMENT Recent Results: The most recent result is correlated with 70 mg per week: Lab Results  Component Value Date   INR 3.1 (A) 02/06/2024   INR 1.9 (A) 01/09/2024   INR 2.2 12/19/2023   PROTIME 20.4 (H) 03/16/2011    Anticoagulation Dosing: Description   Take two (2) of your 5 mg strength peach-colored warfarin tablets by mouth, once-daily on all days of the week--EXCEPT on TUESDAYS and  THURSDAYS, take only one-and-one-half (1 & 1/2) tablets on Tuesdays and Thursdays.       INR today: Therapeutic  PLAN Weekly dose was decreased by 7% to 65 mg per week  Patient Instructions  Patient instructed to take medications as defined in the Anti-coagulation Track section of this encounter.  Patient instructed to take today's dose.  Patient instructed to take  two (2) of your 5 mg strength peach-colored warfarin tablets by mouth, once-daily on all days of the week--EXCEPT on TUESDAYS and THURSDAYS, take only one-and-one-half (1 & 1/2) tablets on Tuesdays and Thursdays.  Patient verbalized understanding of these instructions.  Patient advised to contact clinic or seek medical attention if signs/symptoms of bleeding or thromboembolism  occur.  Patient verbalized understanding by repeating back information and was advised to contact me if further medication-related questions arise. Patient was also provided an information handout.  Follow-up Return in 4 weeks (on 03/05/2024) for Follow up INR.  Kenda Paula, PharmD, CPP Clinical Pharmacist Practitioner  15 minutes spent face-to-face with the patient during the encounter. 50% of time spent on education, including signs/sx bleeding and clotting, as well as food and drug interactions with warfarin. 50% of time was spent on fingerprick POC INR sample collection,processing, results determination, and documentation in [email or web address].

## 2024-02-08 NOTE — Progress Notes (Signed)
Evaluation and management procedures were performed by the Clinical Pharmacy Practitioner under my supervision and collaboration. I have reviewed the Practitioner's note and chart, and I agree with the management and plan as documented above. ° °

## 2024-03-05 ENCOUNTER — Ambulatory Visit (INDEPENDENT_AMBULATORY_CARE_PROVIDER_SITE_OTHER): Admitting: Pharmacist

## 2024-03-05 DIAGNOSIS — I2699 Other pulmonary embolism without acute cor pulmonale: Secondary | ICD-10-CM

## 2024-03-05 DIAGNOSIS — Z7901 Long term (current) use of anticoagulants: Secondary | ICD-10-CM | POA: Diagnosis not present

## 2024-03-05 LAB — POCT INR: INR: 2.1 (ref 2.0–3.0)

## 2024-03-05 MED ORDER — WARFARIN SODIUM 5 MG PO TABS: 10.0000 mg | ORAL_TABLET | Freq: Every day | ORAL | 3 refills | Status: DC

## 2024-03-05 NOTE — Patient Instructions (Signed)
 Patient instructed to take medications as defined in the Anti-coagulation Track section of this encounter.  Patient instructed to take today's dose.  Patient instructed to take two (2) of your 5 mg strength peach-colored warfarin tablets by mouth, once-daily at 4PM. Patient verbalized understanding of these instructions.

## 2024-03-05 NOTE — Progress Notes (Signed)
 Anticoagulation Management Richard [name] is a 48 y.o. male who reports to the clinic for monitoring of warfarin treatment.    Indication: PE, with infarction, History of; Long term current use of oral anticoagulant warfarin with target range INR 2.0 - 3. 0.  Duration: indefinite Supervising physician: Ronnald Sergeant, MD  Anticoagulation Clinic Visit History: Patient does not report signs/symptoms of bleeding or thromboembolism  Other recent changes: No diet, medications, lifestyle changes endorsed by the patient at this visit.  Anticoagulation Episode Summary     Current INR goal:  2.0-3.0  TTR:  57.6% (11.9 y)  Next INR check:  04/02/2024  INR from last check:  2.1 (03/05/2024)  Weekly max warfarin dose:  45 mg  Target end date:  Indefinite  INR check location:  Anticoagulation Clinic  Preferred lab:  --  Send INR reminders to:  --   Indications   Pulmonary embolism and infarction (HCC) [I26.99] D V T (Resolved) [I80.299] Long term (current) use of anticoagulants [Z79.01]        Comments:  --         No Known Allergies  Current Outpatient Medications:    pantoprazole (PROTONIX) 40 MG tablet, Take 40 mg by mouth 2 (two) times daily. (Patient not taking: Reported on 03/05/2024), Disp: , Rfl:    warfarin (COUMADIN ) 5 MG tablet, Take 2 tablets (10 mg total) by mouth daily at 4 PM., Disp: 56 tablet, Rfl: 3 Past Medical History:  Diagnosis Date   D V T 10/09/2010   Qualifier: Diagnosis of  By: Jude MD, Harden ROCKFORD      DVT (deep venous thrombosis) (HCC)    Seasonal allergies    Social History   Socioeconomic History   Marital status: Single    Spouse name: Not on file   Number of children: Not on file   Years of education: Not on file   Highest education level: Not on file  Occupational History   Not on file  Tobacco Use   Smoking status: Former    Current packs/day: 0.00    Types: Cigarettes, Cigars    Start date: 04/06/2011    Quit date: 04/05/2012    Years since  quitting: 11.9   Smokeless tobacco: Never  Substance and Sexual Activity   Alcohol use: No    Alcohol/week: 0.0 standard drinks of alcohol   Drug use: No   Sexual activity: Not on file  Other Topics Concern   Not on file  Social History Narrative   Not on file   Social Drivers of Health   Financial Resource Strain: Not on file  Food Insecurity: Not on file  Transportation Needs: Not on file  Physical Activity: Not on file  Stress: Not on file  Social Connections: Not on file   Family History  Problem Relation Age of Onset   Other Mother    Other Father    Pulmonary embolism Maternal Uncle     ASSESSMENT Recent Results: The most recent result is correlated with 65 mg per week: Lab Results  Component Value Date   INR 2.1 03/05/2024   INR 3.1 (A) 02/06/2024   INR 1.9 (A) 01/09/2024   PROTIME 20.4 (H) 03/16/2011    Anticoagulation Dosing: Description   Take two (2) of your 5 mg strength peach-colored warfarin tablets by mouth, once-daily at 4PM.        INR today: Therapeutic  PLAN Weekly dose was increased by 8% to 70 mg per week  Patient Instructions  Patient instructed to take medications as defined in the Anti-coagulation Track section of this encounter.  Patient instructed to take today's dose.  Patient instructed to take two (2) of your 5 mg strength peach-colored warfarin tablets by mouth, once-daily at 4PM. Patient verbalized understanding of these instructions.  Patient advised to contact clinic or seek medical attention if signs/symptoms of bleeding or thromboembolism occur.  Patient verbalized understanding by repeating back information and was advised to contact me if further medication-related questions arise. Patient was also provided an information handout.  Follow-up Return in 4 weeks (on 04/02/2024) for Follow up INR.  [name], PharmD, CPP Clinical Pharmacist Practitioner  15 minutes spent face-to-face with the patient during the  encounter. 50% of time spent on education, including signs/sx bleeding and clotting, as well as food and drug interactions with warfarin. 50% of time was spent on fingerprick POC INR sample collection,processing, results determination, and documentation in [email or web address].

## 2024-04-02 ENCOUNTER — Ambulatory Visit: Admitting: Pharmacist

## 2024-04-02 DIAGNOSIS — Z7901 Long term (current) use of anticoagulants: Secondary | ICD-10-CM | POA: Diagnosis not present

## 2024-04-02 DIAGNOSIS — I2699 Other pulmonary embolism without acute cor pulmonale: Secondary | ICD-10-CM | POA: Diagnosis not present

## 2024-04-02 LAB — POCT INR: INR: 3 (ref 2.0–3.0)

## 2024-04-02 MED ORDER — WARFARIN SODIUM 5 MG PO TABS: ORAL_TABLET | ORAL | 3 refills | Status: DC

## 2024-04-02 NOTE — Patient Instructions (Signed)
 Patient instructed to take medications as defined in the Anti-coagulation Track section of this encounter.  Patient instructed to take today's dose.  Patient instructed to take  two (2) of your 5 mg strength peach-colored warfarin tablets by mouth, once-daily at 4PM, every day--EXCEPT on MONDAYS, take ONLY one-and-one-half (1 & 1/2) tablets on Mondays.  Patient verbalized understanding of these instructions.

## 2024-04-02 NOTE — Progress Notes (Signed)
 Anticoagulation Management Richard [name] is a 48 y.o. male who reports to the clinic for monitoring of warfarin treatment.    Indication: History of PE, long term current use of oral anticoagulation with warfarin, target INR range 2.0 - 3.0.   Duration: indefinite Supervising physician: Richard Sergeant, [name]  Anticoagulation Clinic Visit History: Patient does not report signs/symptoms of bleeding or thromboembolism  Other recent changes: No diet, medications, lifestyle changes.  Anticoagulation Episode Summary     Current INR goal:  2.0-3.0  TTR:  57.8% (11.9 y)  Next INR check:  05/07/2024  INR from last check:  3.0 (04/02/2024)  Weekly max warfarin dose:  45 mg  Target end date:  Indefinite  INR check location:  Anticoagulation Clinic  Preferred lab:  --  Send INR reminders to:  --   Indications   Pulmonary embolism and infarction (HCC) [I26.99] D V T (Resolved) [I80.299] Long term (current) use of anticoagulants [Z79.01]        Comments:  --         No Known Allergies  Current Outpatient Medications:    pantoprazole (PROTONIX) 40 MG tablet, Take 40 mg by mouth 2 (two) times daily. (Patient not taking: Reported on 04/02/2024), Disp: , Rfl:    warfarin (COUMADIN ) 5 MG tablet, Take two (2) tablets of your 5 mg peach-colored warfarin tablets by mouth once-daily, all days of the week--EXCEPT on MONDAYS, take only one-and-one-half (1 & 1/2) tablets on Mondays., Disp: 56 tablet, Rfl: 3 Past Medical History:  Diagnosis Date   D V T 10/09/2010   Qualifier: Diagnosis of  By: Richard [name], Richard [name].      DVT (deep venous thrombosis) (HCC)    Seasonal allergies    Social History   Socioeconomic History   Marital status: Single    Spouse name: Not on file   Number of children: Not on file   Years of education: Not on file   Highest education level: Not on file  Occupational History   Not on file  Tobacco Use   Smoking status: Former    Current packs/day: 0.00    Types: Cigarettes,  Cigars    Start date: 04/06/2011    Quit date: 04/05/2012    Years since quitting: 12.0   Smokeless tobacco: Never  Substance and Sexual Activity   Alcohol use: No    Alcohol/week: 0.0 standard drinks of alcohol   Drug use: No   Sexual activity: Not on file  Other Topics Concern   Not on file  Social History Narrative   Not on file   Social Drivers of Health   Financial Resource Strain: Not on file  Food Insecurity: Not on file  Transportation Needs: Not on file  Physical Activity: Not on file  Stress: Not on file  Social Connections: Not on file   Family History  Problem Relation Age of Onset   Other Mother    Other Father    Pulmonary embolism Maternal Uncle     ASSESSMENT Recent Results: The most recent result is correlated with 70 mg per week: Lab Results  Component Value Date   INR 3.0 04/02/2024   INR 2.1 03/05/2024   INR 3.1 (A) 02/06/2024   PROTIME 20.4 (H) 03/16/2011    Anticoagulation Dosing: Description   Take two (2) of your 5 mg strength peach-colored warfarin tablets by mouth, once-daily at 4PM, every day--EXCEPT on MONDAYS, take ONLY one-and-one-half (1 & 1/2) tablets on Mondays.      INR  today: Therapeutic  PLAN Weekly dose was decreased by 4% to 67.5 mg per week  Patient Instructions  Patient instructed to take medications as defined in the Anti-coagulation Track section of this encounter.  Patient instructed to take today's dose.  Patient instructed to take  two (2) of your 5 mg strength peach-colored warfarin tablets by mouth, once-daily at 4PM, every day--EXCEPT on MONDAYS, take ONLY one-and-one-half (1 & 1/2) tablets on Mondays.  Patient verbalized understanding of these instructions.  Patient advised to contact clinic or seek medical attention if signs/symptoms of bleeding or thromboembolism occur.  Patient verbalized understanding by repeating back information and was advised to contact me if further medication-related questions arise.  Patient was also provided an information handout.  Follow-up Return in 5 weeks (on 05/07/2024) for Follow up INR.  Richard [name], PharmD, CPP Clinical Pharmacist Practitioner  15 minutes spent face-to-face with the patient during the encounter. 50% of time spent on education, including signs/sx bleeding and clotting, as well as food and drug interactions with warfarin. 50% of time was spent on fingerprick POC INR sample collection,processing, results determination, and documentation in [email or web address].

## 2024-05-07 ENCOUNTER — Ambulatory Visit (INDEPENDENT_AMBULATORY_CARE_PROVIDER_SITE_OTHER): Admitting: Pharmacist

## 2024-05-07 DIAGNOSIS — Z7901 Long term (current) use of anticoagulants: Secondary | ICD-10-CM

## 2024-05-07 DIAGNOSIS — I2699 Other pulmonary embolism without acute cor pulmonale: Secondary | ICD-10-CM

## 2024-05-07 LAB — POCT INR: INR: 2.3 (ref 2.0–3.0)

## 2024-05-07 MED ORDER — WARFARIN SODIUM 5 MG PO TABS
ORAL_TABLET | ORAL | 3 refills | Status: DC
Start: 2024-05-07 — End: 2024-06-04

## 2024-05-07 NOTE — Patient Instructions (Signed)
 Patient instructed to take medications as defined in the Anti-coagulation Track section of this encounter.  Patient instructed to take today's dose.  Patient instructed to take  two (2) of your 5 mg strength peach-colored warfarin tablets by mouth, once-daily at 4PM, every day--EXCEPT on MONDAYS, take ONLY one-and-one-half (1 & 1/2) tablets on Mondays.  Patient verbalized understanding of these instructions.

## 2024-05-07 NOTE — Progress Notes (Signed)
 Anticoagulation Management Richard [name] is a 48 y.o. male who reports to the clinic for monitoring of warfarin treatment.    Indication: PE  with history of infarction, Long term current use of oral anticoagulation with warfarin, targeted INR range 2.0 - 3.0. Duration: indefinite Supervising physician: Mliss Foot, MD  Anticoagulation Clinic Visit History: Patient does not report signs/symptoms of bleeding or thromboembolism  Other recent changes: No diet, medications, lifestyle changes endorsed at this visit.  Anticoagulation Episode Summary     Current INR goal:  2.0-3.0  TTR:  58.2% (12 y)  Next INR check:  06/04/2024  INR from last check:  2.3 (05/07/2024)  Weekly max warfarin dose:  45 mg  Target end date:  Indefinite  INR check location:  Anticoagulation Clinic  Preferred lab:  --  Send INR reminders to:  --   Indications   Pulmonary embolism and infarction (HCC) [I26.99] D V T (Resolved) [I80.299] Long term (current) use of anticoagulants [Z79.01]        Comments:  --         No Known Allergies  Current Outpatient Medications:    pantoprazole (PROTONIX) 40 MG tablet, Take 40 mg by mouth 2 (two) times daily. (Patient not taking: Reported on 05/07/2024), Disp: , Rfl:    warfarin (COUMADIN ) 5 MG tablet, Take two (2) tablets of your 5 mg peach-colored warfarin tablets by mouth once-daily, all days of the week--EXCEPT on MONDAYS, take only one-and-one-half (1 & 1/2) tablets on Mondays., Disp: 56 tablet, Rfl: 3 Past Medical History:  Diagnosis Date   D V T 10/09/2010   Qualifier: Diagnosis of  By: Jude MD, Harden [name].      DVT (deep venous thrombosis) (HCC)    Seasonal allergies    Social History   Socioeconomic History   Marital status: Single    Spouse name: Not on file   Number of children: Not on file   Years of education: Not on file   Highest education level: Not on file  Occupational History   Not on file  Tobacco Use   Smoking status: Former    Current  packs/day: 0.00    Types: Cigarettes, Cigars    Start date: 04/06/2011    Quit date: 04/05/2012    Years since quitting: 12.0   Smokeless tobacco: Never  Substance and Sexual Activity   Alcohol use: No    Alcohol/week: 0.0 standard drinks of alcohol   Drug use: No   Sexual activity: Not on file  Other Topics Concern   Not on file  Social History Narrative   Not on file   Social Drivers of Health   Financial Resource Strain: Not on file  Food Insecurity: Not on file  Transportation Needs: Not on file  Physical Activity: Not on file  Stress: Not on file  Social Connections: Not on file   Family History  Problem Relation Age of Onset   Other Mother    Other Father    Pulmonary embolism Maternal Uncle     ASSESSMENT Recent Results: The most recent result is correlated with 65 mg warfarin per week: Lab Results  Component Value Date   INR 2.3 05/07/2024   INR 3.0 04/02/2024   INR 2.1 03/05/2024   PROTIME 20.4 (H) 03/16/2011    Anticoagulation Dosing: Description   Take two (2) of your 5 mg strength peach-colored warfarin tablets by mouth, once-daily at 4PM, every day--EXCEPT on MONDAYS, take ONLY one-and-one-half (1 & 1/2) tablets on Mondays.  INR today: Therapeutic  PLAN Weekly dose was unchanged.   Patient Instructions  Patient instructed to take medications as defined in the Anti-coagulation Track section of this encounter.  Patient instructed to take today's dose.  Patient instructed to take  two (2) of your 5 mg strength peach-colored warfarin tablets by mouth, once-daily at 4PM, every day--EXCEPT on MONDAYS, take ONLY one-and-one-half (1 & 1/2) tablets on Mondays.  Patient verbalized understanding of these instructions.  Patient advised to contact clinic or seek medical attention if signs/symptoms of bleeding or thromboembolism occur.  Patient verbalized understanding by repeating back information and was advised to contact me if further  medication-related questions arise. Patient was also provided an information handout.  Follow-up Return in 4 weeks (on 06/04/2024) for Follow up INR.  [name], PharmD, CPP Clinical Pharmacist Practitioner  15 minutes spent face-to-face with the patient during the encounter. 50% of time spent on education, including signs/sx bleeding and clotting, as well as food and drug interactions with warfarin. 50% of time was spent on fingerprick POC INR sample collection,processing, results determination, and documentation in [email or web address].

## 2024-05-08 NOTE — Progress Notes (Signed)
Evaluation and management procedures were performed by the Clinical Pharmacy Practitioner under my supervision and collaboration. I have reviewed the Practitioner's note and chart, and I agree with the management and plan as documented above. ° °

## 2024-06-04 ENCOUNTER — Ambulatory Visit (INDEPENDENT_AMBULATORY_CARE_PROVIDER_SITE_OTHER): Admitting: Pharmacist

## 2024-06-04 DIAGNOSIS — I2699 Other pulmonary embolism without acute cor pulmonale: Secondary | ICD-10-CM

## 2024-06-04 DIAGNOSIS — Z7901 Long term (current) use of anticoagulants: Secondary | ICD-10-CM

## 2024-06-04 LAB — POCT INR: INR: 1.8 — AB (ref 2.0–3.0)

## 2024-06-04 MED ORDER — WARFARIN SODIUM 5 MG PO TABS: 10.0000 mg | ORAL_TABLET | Freq: Every day | ORAL | 3 refills | Status: DC

## 2024-06-04 NOTE — Progress Notes (Signed)
 Anticoagulation Management Richard [name] is a 48 y.o. male who reports to the clinic for monitoring of warfarin treatment.    Indication: PE with infarction, long term current use of oral anticoagulation with warfarin. Target INR range 2.0 -3.0. Duration: indefinite Supervising physician: Mliss Foot, MD  Anticoagulation Clinic Visit History: Patient does report signs/symptoms of bleeding or thromboembolism>>bleeding seen when he sustained a laceration of the left middle finger, distal phalanx, requiring sutures to close the wound. Has been on cephalexin 500 mg PO q8 hours. Today is last day of dosing he states.  Other recent changes: No diet, medications, lifestyle changes except as noted above and in patient findings.  Anticoagulation Episode Summary     Current INR goal:  2.0-3.0  TTR:  58.2% (12.1 y)  Next INR check:  07/09/2024  INR from last check:  1.8 (06/04/2024)  Weekly max warfarin dose:  45 mg  Target end date:  Indefinite  INR check location:  Anticoagulation Clinic  Preferred lab:  --  Send INR reminders to:  --   Indications   Pulmonary embolism and infarction (HCC) [I26.99] D V T (Resolved) [I80.299] Long term (current) use of anticoagulants [Z79.01]        Comments:  --         No Known Allergies  Current Outpatient Medications:    pantoprazole (PROTONIX) 40 MG tablet, Take 40 mg by mouth 2 (two) times daily. (Patient not taking: Reported on 06/04/2024), Disp: , Rfl:    warfarin (COUMADIN ) 5 MG tablet, Take 2 tablets (10 mg total) by mouth daily at 4 PM., Disp: 60 tablet, Rfl: 3 Past Medical History:  Diagnosis Date   D V T 10/09/2010   Qualifier: Diagnosis of  By: Jude MD, Harden ROCKFORD      DVT (deep venous thrombosis) (HCC)    Seasonal allergies    Social History   Socioeconomic History   Marital status: Single    Spouse name: Not on file   Number of children: Not on file   Years of education: Not on file   Highest education level: Not on file   Occupational History   Not on file  Tobacco Use   Smoking status: Former    Current packs/day: 0.00    Types: Cigarettes, Cigars    Start date: 04/06/2011    Quit date: 04/05/2012    Years since quitting: 12.1   Smokeless tobacco: Never  Substance and Sexual Activity   Alcohol use: No    Alcohol/week: 0.0 standard drinks of alcohol   Drug use: No   Sexual activity: Not on file  Other Topics Concern   Not on file  Social History Narrative   Not on file   Social Drivers of Health   Financial Resource Strain: Not on file  Food Insecurity: Not on file  Transportation Needs: Not on file  Physical Activity: Not on file  Stress: Not on file  Social Connections: Not on file   Family History  Problem Relation Age of Onset   Other Mother    Other Father    Pulmonary embolism Maternal Uncle     ASSESSMENT Recent Results: The most recent result is correlated with 65 mg  warfarin per week: Lab Results  Component Value Date   INR 1.8 (A) 06/04/2024   INR 2.3 05/07/2024   INR 3.0 04/02/2024   PROTIME 20.4 (H) 03/16/2011    Anticoagulation Dosing: Description   Take two (2) of your 5 mg strength peach-colored warfarin tablets  by mouth, once-daily at 4PM, every day.     INR today: Subtherapeutic  PLAN Weekly dose was increased by 4% to 70 mg  warfarin per week.   Patient Instructions  Patient instructed to take medications as defined in the Anti-coagulation Track section of this encounter.  Patient instructed to take today's dose.  Patient instructed to take two (2) of your 5 mg strength peach-colored warfarin tablets by mouth, once-daily at 4PM, every day. Patient verbalized understanding of these instructions.  Patient advised to contact clinic or seek medical attention if signs/symptoms of bleeding or thromboembolism occur.  Patient verbalized understanding by repeating back information and was advised to contact me if further medication-related questions arise.  Patient was also provided an information handout.  Follow-up Return in 5 weeks (on 07/09/2024) for Follow up INR.  Richard [name], PharmD, CPP Clinical Pharmacist Practitioner  15 minutes spent face-to-face with the patient during the encounter. 50% of time spent on education, including signs/sx bleeding and clotting, as well as food and drug interactions with warfarin. 50% of time was spent on fingerprick POC INR sample collection,processing, results determination, and documentation in [email or web address].

## 2024-06-04 NOTE — Patient Instructions (Signed)
 Patient instructed to take medications as defined in the Anti-coagulation Track section of this encounter.  Patient instructed to take today's dose.  Patient instructed to take two (2) of your 5 mg strength peach-colored warfarin tablets by mouth, once-daily at 4PM, every day. Patient verbalized understanding of these instructions.

## 2024-06-05 NOTE — Progress Notes (Signed)
Evaluation and management procedures were performed by the Clinical Pharmacy Practitioner under my supervision and collaboration. I have reviewed the Practitioner's note and chart, and I agree with the management and plan as documented above. ° °

## 2024-07-09 ENCOUNTER — Ambulatory Visit: Admitting: Pharmacist

## 2024-07-09 DIAGNOSIS — I2699 Other pulmonary embolism without acute cor pulmonale: Secondary | ICD-10-CM

## 2024-07-09 DIAGNOSIS — Z7901 Long term (current) use of anticoagulants: Secondary | ICD-10-CM | POA: Diagnosis not present

## 2024-07-09 LAB — POCT INR: INR: 2 (ref 2.0–3.0)

## 2024-07-09 MED ORDER — WARFARIN SODIUM 5 MG PO TABS: ORAL_TABLET | ORAL | 3 refills | Status: DC

## 2024-07-09 NOTE — Patient Instructions (Signed)
 Patient instructed to take medications as defined in the Anti-coagulation Track section of this encounter.  Patient instructed to take today's dose.  Patient instructed to take 2 of your 5 mg strength, peach-colored warfarin tablets on all days of the week--EXCEPT on MONDAYS and THURSDAYS--take 2 and 1/2 (2 & 1/2) tablets on Mondays and Thursdays.  Patient verbalized understanding of these instructions.

## 2024-07-09 NOTE — Progress Notes (Signed)
 Anticoagulation Management Richard [name] is a 48 y.o. male who reports to the clinic for monitoring of warfarin treatment.    Indication: PE , History of, with infarction; long term current use of oral anticoagulation with warfarin for target INR range of 2.0 - 3.0.  Duration: indefinite Supervising physician: Mliss LABOR. Trudy, MD  Anticoagulation Clinic Visit History: Patient does not report signs/symptoms of bleeding or thromboembolism  Other recent changes: No diet, medications, lifestyle changes cited or identified by the patient at this visit.  Anticoagulation Episode Summary     Current INR goal:  2.0-3.0  TTR:  57.7% (12.2 y)  Next INR check:  08/06/2024  INR from last check:  2.0 (07/09/2024)  Weekly max warfarin dose:  45 mg  Target end date:  Indefinite  INR check location:  Anticoagulation Clinic  Preferred lab:  --  Send INR reminders to:  --   Indications   Pulmonary embolism and infarction (HCC) [I26.99] D V T (Resolved) [I80.299] Long term (current) use of anticoagulants [Z79.01]        Comments:  --         No Known Allergies  Current Outpatient Medications:    pantoprazole (PROTONIX) 40 MG tablet, Take 40 mg by mouth 2 (two) times daily. (Patient not taking: Reported on 07/09/2024), Disp: , Rfl:    warfarin (COUMADIN ) 5 MG tablet, Take two (2) tablets of your 5 mg peach-colored warfarin tablets on SUNDAYS, TUESDAYS, WEDNESDAYS, FRIDAYS and SATURDAYS. Take two-and-one-half (2 & 1/2) tablets on MONDAYS and THURSDAYS., Disp: 60 tablet, Rfl: 3 Past Medical History:  Diagnosis Date   D V T 10/09/2010   Qualifier: Diagnosis of  By: Jude MD, Harden [name].      DVT (deep venous thrombosis) (HCC)    Seasonal allergies    Social History   Socioeconomic History   Marital status: Single    Spouse name: Not on file   Number of children: Not on file   Years of education: Not on file   Highest education level: Not on file  Occupational History   Not on file   Tobacco Use   Smoking status: Former    Current packs/day: 0.00    Types: Cigarettes, Cigars    Start date: 04/06/2011    Quit date: 04/05/2012    Years since quitting: 12.2   Smokeless tobacco: Never  Substance and Sexual Activity   Alcohol use: No    Alcohol/week: 0.0 standard drinks of alcohol   Drug use: No   Sexual activity: Not on file  Other Topics Concern   Not on file  Social History Narrative   Not on file   Social Drivers of Health   Financial Resource Strain: Not on file  Food Insecurity: Not on file  Transportation Needs: Not on file  Physical Activity: Not on file  Stress: Not on file  Social Connections: Not on file   Family History  Problem Relation Age of Onset   Other Mother    Other Father    Pulmonary embolism Maternal Uncle     ASSESSMENT Recent Results: The most recent result is correlated with 70 mg warfarin per week: Lab Results  Component Value Date   INR 2.0 07/09/2024   INR 1.8 (A) 06/04/2024   INR 2.3 05/07/2024   PROTIME 20.4 (H) 03/16/2011    Anticoagulation Dosing: Description   Take 2 of your 5 mg strength, peach-colored warfarin tablets on all days of the week--EXCEPT on MONDAYS and THURSDAYS--take 2 and  1/2 (2 & 1/2) tablets on Mondays and Thursdays.      INR today: Therapeutic  PLAN Weekly dose was increased by 7% to 75 mg warfarin per week  Patient Instructions  Patient instructed to take medications as defined in the Anti-coagulation Track section of this encounter.  Patient instructed to take today's dose.  Patient instructed to take 2 of your 5 mg strength, peach-colored warfarin tablets on all days of the week--EXCEPT on MONDAYS and THURSDAYS--take 2 and 1/2 (2 & 1/2) tablets on Mondays and Thursdays.  Patient verbalized understanding of these instructions.  Patient advised to contact clinic or seek medical attention if signs/symptoms of bleeding or thromboembolism occur.  Patient verbalized understanding by  repeating back information and was advised to contact me if further medication-related questions arise. Patient was also provided an information handout.  Follow-up Return in 4 weeks (on 08/06/2024) for Follow up INR.  [name], PharmD, CPP Clinical Pharmacist Practitioner  15 minutes spent face-to-face with the patient during the encounter. 50% of time spent on education, including signs/sx bleeding and clotting, as well as food and drug interactions with warfarin. 50% of time was spent on fingerprick POC INR sample collection,processing, results determination, and documentation in [email or web address].

## 2024-07-10 NOTE — Progress Notes (Signed)
.  jw

## 2024-08-06 ENCOUNTER — Ambulatory Visit: Admitting: Pharmacist

## 2024-08-06 DIAGNOSIS — Z7901 Long term (current) use of anticoagulants: Secondary | ICD-10-CM

## 2024-08-06 DIAGNOSIS — I2699 Other pulmonary embolism without acute cor pulmonale: Secondary | ICD-10-CM

## 2024-08-06 LAB — POCT INR: INR: 3.9 — AB (ref 2.0–3.0)

## 2024-08-06 MED ORDER — WARFARIN SODIUM 5 MG PO TABS: ORAL_TABLET | ORAL | 3 refills | Status: AC

## 2024-08-06 NOTE — Patient Instructions (Signed)
 Patient instructed to take medications as defined in the Anti-coagulation Track section of this encounter.  Patient instructed to take today's dose.  Patient instructed to take 2 (two) of your 5 mg strength, peach-colored warfarin tablets on all days of the week, EXCEPT on THURSDAYS, take two-and-one-half  (2 and 1/2) tablets on THURSDAYS.  Patient verbalized understanding of these instructions.

## 2024-08-06 NOTE — Progress Notes (Signed)
 Anticoagulation Management Richard [name] is a 48 y.o. male who reports to the clinic for monitoring of warfarin treatment.    Indication: PE with infarction, long term current use of oral anticoagulation with warfarin for target INR range 2.0 - 3.0.  Duration: indefinite Supervising physician: Mliss Foot, MD  Anticoagulation Clinic Visit History: Patient does not report signs/symptoms of bleeding or thromboembolism  Other recent changes: No diet, medications, lifestyle changes endorsed by the patient at this visit.  Anticoagulation Episode Summary     Current INR goal:  2.0-3.0  TTR:  57.7% (12.3 y)  Next INR check:  09/03/2024  INR from last check:  3.9 (08/06/2024)  Weekly max warfarin dose:  45 mg  Target end date:  Indefinite  INR check location:  Anticoagulation Clinic  Preferred lab:  --  Send INR reminders to:  --   Indications   Pulmonary embolism and infarction (HCC) [I26.99] D V T (Resolved) [I80.299] Long term (current) use of anticoagulants [Z79.01]        Comments:  --         Allergies[1] Current Medications[2] Past Medical History:  Diagnosis Date   D V T 10/09/2010   Qualifier: Diagnosis of  By: Jude MD, Harden [name].      DVT (deep venous thrombosis) (HCC)    Seasonal allergies    Social History   Socioeconomic History   Marital status: Single    Spouse name: Not on file   Number of children: Not on file   Years of education: Not on file   Highest education level: Not on file  Occupational History   Not on file  Tobacco Use   Smoking status: Former    Current packs/day: 0.00    Types: Cigarettes, Cigars    Start date: 04/06/2011    Quit date: 04/05/2012    Years since quitting: 12.3   Smokeless tobacco: Never  Substance and Sexual Activity   Alcohol use: No    Alcohol/week: 0.0 standard drinks of alcohol   Drug use: No   Sexual activity: Not on file  Other Topics Concern   Not on file  Social History Narrative   Not on file   Social  Drivers of Health   Tobacco Use: Medium Risk (11/29/2023)   Patient History    Smoking Tobacco Use: Former    Smokeless Tobacco Use: Never    Passive Exposure: Not on Actuary Strain: Not on file  Food Insecurity: Not on file  Transportation Needs: Not on file  Physical Activity: Not on file  Stress: Not on file  Social Connections: Not on file  Depression (PHQ2-9): Low Risk (10/19/2021)   Depression (PHQ2-9)    PHQ-2 Score: 0  Alcohol Screen: Not on file  Housing: Not on file  Utilities: Not on file  Health Literacy: Not on file   Family History  Problem Relation Age of Onset   Other Mother    Other Father    Pulmonary embolism Maternal Uncle     ASSESSMENT Recent Results: The most recent result is correlated with 75 mg per week: Lab Results  Component Value Date   INR 3.9 (A) 08/06/2024   INR 2.0 07/09/2024   INR 1.8 (A) 06/04/2024   PROTIME 20.4 (H) 03/16/2011    Anticoagulation Dosing: Description   Take 2 of your 5 mg strength, peach-colored warfarin tablets on all days of the week--EXCEPT on THURSDAYS--take 2 and 1/2 (2 & 1/2) tablets on Thursdays.  INR today: Supratherapeutic  PLAN Weekly dose was decreased by 3.3% to 72.5 mg per week  Patient Instructions  Patient instructed to take medications as defined in the Anti-coagulation Track section of this encounter.  Patient instructed to take today's dose.  Patient instructed to take 2 (two) of your 5 mg strength, peach-colored warfarin tablets on all days of the week, EXCEPT on THURSDAYS, take two-and-one-half  (2 and 1/2) tablets on THURSDAYS.  Patient verbalized understanding of these instructions.  Patient advised to contact clinic or seek medical attention if signs/symptoms of bleeding or thromboembolism occur.  Patient verbalized understanding by repeating back information and was advised to contact me if further medication-related questions arise. Patient was also provided an  information handout.  Follow-up Return in about 4 weeks (around 09/03/2024) for Follow up INR.  [name], PharmD, CPP Clinical Pharmacist Practitioner  15 minutes spent face-to-face with the patient during the encounter. 50% of time spent on education, including signs/sx bleeding and clotting, as well as food and drug interactions with warfarin. 50% of time was spent on fingerprick POC INR sample collection,processing, results determination, and documentation in [email or web address].    [1] No Known Allergies [2]  Current Outpatient Medications:    pantoprazole (PROTONIX) 40 MG tablet, Take 40 mg by mouth 2 (two) times daily. (Patient not taking: Reported on 08/06/2024), Disp: , Rfl:    warfarin (COUMADIN ) 5 MG tablet, Take two (2) tablets of your 5 mg peach-colored warfarin tablets on SUNDAYS, MONDAYS, TUESDAYS, WEDNESDAYS, FRIDAYS and SATURDAYS. Take two-and-one-half (2 & 1/2) tablets on THURSDAYS., Disp: 58 tablet, Rfl: 3

## 2024-08-06 NOTE — Progress Notes (Signed)
Evaluation and management procedures were performed by the Clinical Pharmacy Practitioner under my supervision and collaboration. I have reviewed the Practitioner's note and chart, and I agree with the management and plan as documented above. ° °

## 2024-09-03 ENCOUNTER — Ambulatory Visit: Payer: Self-pay

## 2024-09-03 DIAGNOSIS — Z7901 Long term (current) use of anticoagulants: Secondary | ICD-10-CM

## 2024-09-03 DIAGNOSIS — I2699 Other pulmonary embolism without acute cor pulmonale: Secondary | ICD-10-CM

## 2024-09-03 LAB — POCT INR: INR: 2.9 (ref 2.0–3.0)

## 2024-09-04 NOTE — Patient Instructions (Signed)
 Patient instructed to take medications as defined in the Anti-coagulation Track section of this encounter.  Patient instructed to take today's dose.  Patient instructed to take only two (2) of your 5 mg strength, peach colored warfarin tablets by mouth, once daily,  Patient verbalized understanding of these instructions.

## 2024-09-04 NOTE — Progress Notes (Signed)
 Anticoagulation Management Richard [name] is a 49 y.o. male who reports to the clinic for monitoring of warfarin treatment.    Indication: PE with infarction, Long term current use of oral anticoagulant, warfariin. Target INR range 2.0 - 3.0. Duration: indefinite Supervising physician: Richard Foot, [name]  Anticoagulation Clinic Visit History: Patient does not report signs/symptoms of bleeding or thromboembolism  Other recent changes: No diet, medications, lifestyle Anticoagulation Episode Summary     Current INR goal:  2.0-3.0  TTR:  57.4% (12.4 y)  Next INR check:  10/01/2024  INR from last check:  2.9 (09/03/2024)  Weekly max warfarin dose:  45 mg  Target end date:  Indefinite  INR check location:  Anticoagulation Clinic  Preferred lab:  --  Send INR reminders to:  --   Indications   Pulmonary embolism and infarction (HCC) [I26.99] D V T (Resolved) [I80.299] Long term (current) use of anticoagulants [Z79.01]        Comments:  --         Allergies[1] Current Medications[2] Past Medical History:  Diagnosis Date   D V T 10/09/2010   Qualifier: Diagnosis of  By: Richard [name], Richard [name].      DVT (deep venous thrombosis) (HCC)    Seasonal allergies    Social History   Socioeconomic History   Marital status: Single    Spouse name: Not on file   Number of children: Not on file   Years of education: Not on file   Highest education level: Not on file  Occupational History   Not on file  Tobacco Use   Smoking status: Former    Current packs/day: 0.00    Types: Cigarettes, Cigars    Start date: 04/06/2011    Quit date: 04/05/2012    Years since quitting: 12.4   Smokeless tobacco: Never  Substance and Sexual Activity   Alcohol use: No    Alcohol/week: 0.0 standard drinks of alcohol   Drug use: No   Sexual activity: Not on file  Other Topics Concern   Not on file  Social History Narrative   Not on file   Social Drivers of Health   Tobacco Use: Medium Risk (11/29/2023)    Patient History    Smoking Tobacco Use: Former    Smokeless Tobacco Use: Never    Passive Exposure: Not on Actuary Strain: Not on file  Food Insecurity: Not on file  Transportation Needs: Not on file  Physical Activity: Not on file  Stress: Not on file  Social Connections: Not on file  Depression (PHQ2-9): Low Risk (10/19/2021)   Depression (PHQ2-9)    PHQ-2 Score: 0  Alcohol Screen: Not on file  Housing: Not on file  Utilities: Not on file  Health Literacy: Not on file   Family History  Problem Relation Age of Onset   Other Mother    Other Father    Pulmonary embolism Maternal Uncle     ASSESSMENT Recent Results: The most recent result is correlated with 72.5 mg per week: Lab Results  Component Value Date   INR 2.9 09/03/2024   INR 3.9 (A) 08/06/2024   INR 2.0 07/09/2024   PROTIME 20.4 (H) 03/16/2011    Anticoagulation Dosing: Description   Take 2 of your 5 mg strength, peach-colored warfarin tablets on all days of the week.     INR today: Therapeutic  PLAN Weekly dose was decreased by 5% to 70 mg per week  Patient Instructions  Patient instructed to  take medications as defined in the Anti-coagulation Track section of this encounter.  Patient instructed to take today's dose.  Patient instructed to take only two (2) of your 5 mg strength, peach colored warfarin tablets by mouth, once daily,  Patient verbalized understanding of these instructions.  Patient advised to contact clinic or seek medical attention if signs/symptoms of bleeding or thromboembolism occur.  Patient verbalized understanding by repeating back information and was advised to contact me if further medication-related questions arise. Patient was also provided an information handout.  Follow-up Return in 4 weeks (on 10/01/2024) for Follow up INR.  Richard [name], PharmD, CPP Clinical Pharmacist Practitioner  15 minutes spent face-to-face with the patient during the encounter.  50% of time spent on education, including signs/sx bleeding and clotting, as well as food and drug interactions with warfarin. 50% of time was spent on fingerprick POC INR sample collection,processing, results determination, and documentation in [email or web address].    [1] No Known Allergies [2]  Current Outpatient Medications:    pantoprazole (PROTONIX) 40 MG tablet, Take 40 mg by mouth 2 (two) times daily. (Patient not taking: Reported on 08/06/2024), Disp: , Rfl:    warfarin (COUMADIN ) 5 MG tablet, Take two (2) tablets of your 5 mg peach-colored warfarin tablets on SUNDAYS, MONDAYS, TUESDAYS, WEDNESDAYS, FRIDAYS and SATURDAYS. Take two-and-one-half (2 & 1/2) tablets on THURSDAYS., Disp: 58 tablet, Rfl: 3

## 2024-10-01 ENCOUNTER — Ambulatory Visit: Payer: Self-pay
